# Patient Record
Sex: Female | Born: 1965 | Race: White | Hispanic: No | Marital: Single | Smoking: Current every day smoker
Health system: Southern US, Community
[De-identification: ages and names within clinical notes are randomized; demographics above are authoritative.]

## PROBLEM LIST (undated history)

## (undated) DIAGNOSIS — J449 Chronic obstructive pulmonary disease, unspecified: Secondary | ICD-10-CM

## (undated) DIAGNOSIS — F32A Depression, unspecified: Secondary | ICD-10-CM

## (undated) DIAGNOSIS — F419 Anxiety disorder, unspecified: Secondary | ICD-10-CM

## (undated) DIAGNOSIS — F329 Major depressive disorder, single episode, unspecified: Secondary | ICD-10-CM

## (undated) DIAGNOSIS — E785 Hyperlipidemia, unspecified: Secondary | ICD-10-CM

## (undated) DIAGNOSIS — R569 Unspecified convulsions: Secondary | ICD-10-CM

## (undated) DIAGNOSIS — J439 Emphysema, unspecified: Secondary | ICD-10-CM

## (undated) HISTORY — DX: Depression, unspecified: F32.A

## (undated) HISTORY — DX: Anxiety disorder, unspecified: F41.9

## (undated) HISTORY — PX: KNEE SURGERY: SHX244

## (undated) HISTORY — PX: CERVICAL SPINE SURGERY: SHX589

## (undated) HISTORY — DX: Hyperlipidemia, unspecified: E78.5

## (undated) HISTORY — DX: Emphysema, unspecified: J43.9

## (undated) HISTORY — PX: CHOLECYSTECTOMY: SHX55

## (undated) HISTORY — PX: TONSILLECTOMY: SUR1361

## (undated) HISTORY — PX: BACK SURGERY: SHX140

## (undated) HISTORY — PX: APPENDECTOMY: SHX54

## (undated) HISTORY — DX: Chronic obstructive pulmonary disease, unspecified: J44.9

---
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
## 2005-01-08 HISTORY — PX: ABDOMINAL HYSTERECTOMY: SHX81

## 2015-04-29 ENCOUNTER — Emergency Department (HOSPITAL_COMMUNITY): Payer: Medicaid Other

## 2015-04-29 ENCOUNTER — Emergency Department (HOSPITAL_COMMUNITY)
Admission: EM | Admit: 2015-04-29 | Discharge: 2015-04-30 | Disposition: A | Discharge status: Home / Self Care | Payer: Medicaid Other | Attending: Emergency Medicine | Admitting: Emergency Medicine

## 2015-04-29 ENCOUNTER — Encounter (HOSPITAL_COMMUNITY): Payer: Self-pay | Admitting: *Deleted

## 2015-04-29 DIAGNOSIS — R112 Nausea with vomiting, unspecified: Secondary | ICD-10-CM | POA: Diagnosis not present

## 2015-04-29 DIAGNOSIS — J441 Chronic obstructive pulmonary disease with (acute) exacerbation: Secondary | ICD-10-CM | POA: Insufficient documentation

## 2015-04-29 DIAGNOSIS — F17210 Nicotine dependence, cigarettes, uncomplicated: Secondary | ICD-10-CM | POA: Insufficient documentation

## 2015-04-29 HISTORY — DX: Chronic obstructive pulmonary disease, unspecified: J44.9

## 2015-04-29 HISTORY — DX: Unspecified convulsions: R56.9

## 2015-04-29 LAB — COMPREHENSIVE METABOLIC PANEL
ALBUMIN: 4.3 g/dL (ref 3.5–5.0)
ALT: 19 U/L (ref 14–54)
AST: 18 U/L (ref 15–41)
Alkaline Phosphatase: 84 U/L (ref 38–126)
Anion gap: 10 (ref 5–15)
BUN: 14 mg/dL (ref 6–20)
CO2: 26 mmol/L (ref 22–32)
Calcium: 9.8 mg/dL (ref 8.9–10.3)
Chloride: 108 mmol/L (ref 101–111)
Creatinine, Ser: 0.99 mg/dL (ref 0.44–1.00)
GFR calc Af Amer: >60 mL/min (ref >60)
GLUCOSE: 100 mg/dL — AB (ref 65–99)
Potassium: 4.1 mmol/L (ref 3.5–5.1)
Sodium: 144 mmol/L (ref 135–145)
Total Bilirubin: 0.6 mg/dL (ref 0.3–1.2)
Total Protein: 7.7 g/dL (ref 6.5–8.1)

## 2015-04-29 LAB — CBC
HEMATOCRIT: 46.4 % — AB (ref 36.0–46.0)
Hemoglobin: 16.3 g/dL — ABNORMAL HIGH (ref 12.0–15.0)
MCH: 28.6 pg (ref 26.0–34.0)
MCHC: 35.1 g/dL (ref 30.0–36.0)
MCV: 81.5 fL (ref 78.0–100.0)
PLATELETS: 287 10*3/uL (ref 150–400)
RBC: 5.69 MIL/uL — ABNORMAL HIGH (ref 3.87–5.11)
RDW: 12.8 % (ref 11.5–15.5)
WBC: 17.8 10*3/uL — AB (ref 4.0–10.5)

## 2015-04-29 LAB — URINALYSIS, ROUTINE W REFLEX MICROSCOPIC
BILIRUBIN URINE: NEGATIVE
GLUCOSE, UA: NEGATIVE mg/dL
Ketones, ur: 15 mg/dL — AB
Leukocytes, UA: NEGATIVE
Nitrite: NEGATIVE
PH: 6.5 (ref 5.0–8.0)
PROTEIN: NEGATIVE mg/dL
Specific Gravity, Urine: 1.02 (ref 1.005–1.030)

## 2015-04-29 LAB — URINE MICROSCOPIC-ADD ON

## 2015-04-29 LAB — LIPASE, BLOOD: Lipase: 26 U/L (ref 11–51)

## 2015-04-29 LAB — TROPONIN I: Troponin I: <0.03 ng/mL (ref <0.031)

## 2015-04-29 MED ORDER — SODIUM CHLORIDE 0.9 % IV SOLN: INTRAVENOUS | Status: DC

## 2015-04-29 MED ORDER — METOCLOPRAMIDE HCL 10 MG PO TABS: 10.0000 mg | ORAL_TABLET | Freq: Four times a day (QID) | ORAL | Status: DC | PRN

## 2015-04-29 MED ORDER — ALBUTEROL SULFATE HFA 108 (90 BASE) MCG/ACT IN AERS: 2.0000 | INHALATION_SPRAY | RESPIRATORY_TRACT | Status: DC | PRN

## 2015-04-29 MED ORDER — SODIUM CHLORIDE 0.9 % IV BOLUS (SEPSIS)
1000.0000 mL | Freq: Once | INTRAVENOUS | Status: AC
  Administered 2015-04-29: 1000 mL via INTRAVENOUS

## 2015-04-29 MED ORDER — IPRATROPIUM-ALBUTEROL 0.5-2.5 (3) MG/3ML IN SOLN
3.0000 mL | Freq: Once | RESPIRATORY_TRACT | Status: AC
  Administered 2015-04-29: 3 mL via RESPIRATORY_TRACT
  Filled 2015-04-29: qty 3

## 2015-04-29 MED ORDER — IOPAMIDOL (ISOVUE-300) INJECTION 61%
100.0000 mL | Freq: Once | INTRAVENOUS | Status: AC | PRN
  Administered 2015-04-29: 100 mL via INTRAVENOUS

## 2015-04-29 MED ORDER — METOCLOPRAMIDE HCL 5 MG/ML IJ SOLN
10.0000 mg | Freq: Once | INTRAMUSCULAR | Status: AC
  Administered 2015-04-29: 10 mg via INTRAVENOUS
  Filled 2015-04-29: qty 2

## 2015-04-29 MED ORDER — ALBUTEROL SULFATE (2.5 MG/3ML) 0.083% IN NEBU
2.5000 mg | INHALATION_SOLUTION | Freq: Once | RESPIRATORY_TRACT | Status: AC
  Administered 2015-04-29: 2.5 mg via RESPIRATORY_TRACT
  Filled 2015-04-29: qty 3

## 2015-04-29 MED ORDER — METHYLPREDNISOLONE SODIUM SUCC 125 MG IJ SOLR
125.0000 mg | Freq: Once | INTRAMUSCULAR | Status: AC
  Administered 2015-04-30: 125 mg via INTRAVENOUS
  Filled 2015-04-29: qty 2

## 2015-04-29 MED ORDER — ONDANSETRON HCL 4 MG/2ML IJ SOLN
4.0000 mg | INTRAMUSCULAR | Status: DC | PRN
  Administered 2015-04-29: 4 mg via INTRAVENOUS
  Filled 2015-04-29: qty 2

## 2015-04-29 MED ORDER — ALBUTEROL SULFATE (2.5 MG/3ML) 0.083% IN NEBU
2.5000 mg | INHALATION_SOLUTION | Freq: Once | RESPIRATORY_TRACT | Status: DC
  Filled 2015-04-29: qty 3

## 2015-04-29 MED ORDER — ONDANSETRON HCL 4 MG/2ML IJ SOLN
4.0000 mg | Freq: Once | INTRAMUSCULAR | Status: AC
  Administered 2015-04-29: 4 mg via INTRAVENOUS
  Filled 2015-04-29: qty 2

## 2015-04-29 MED ORDER — SODIUM CHLORIDE 0.9 % IV BOLUS (SEPSIS)
1000.0000 mL | Freq: Once | INTRAVENOUS | Status: AC
Start: 2015-04-29 — End: 2015-04-29
  Administered 2015-04-29: 1000 mL via INTRAVENOUS

## 2015-04-29 MED ORDER — IPRATROPIUM-ALBUTEROL 0.5-2.5 (3) MG/3ML IN SOLN
3.0000 mL | Freq: Once | RESPIRATORY_TRACT | Status: DC
Start: 2015-04-29 — End: 2015-04-30
  Filled 2015-04-29: qty 3

## 2015-04-29 MED ORDER — FENTANYL CITRATE (PF) 100 MCG/2ML IJ SOLN
50.0000 ug | Freq: Once | INTRAMUSCULAR | Status: AC
  Administered 2015-04-29: 50 ug via INTRAVENOUS
  Filled 2015-04-29: qty 2

## 2015-04-29 NOTE — ED Notes (Signed)
Pt reports she was [facility] today at 11 am. Has vomited x 7 today. No diarrhea. Complaining of headache and abdominal pain

## 2015-04-29 NOTE — ED Provider Notes (Signed)
CSN: [id number]     Arrival date & time 04/29/15  1914 History   First MD Initiated Contact with Patient 04/29/15 2103     Chief Complaint  Patient presents with  . Emesis      HPI Pt was seen at 2110. Per pt, c/o gradual onset and persistence of multiple intermittent episodes of N/V that began 2 weeks ago. Pt states she was admitted to [facility] and discharged today. States she was dx COPD exacerbation, flu and seizures. Pt's family states "they didn't do anything," and pt continues to vomit since being discharged this morning. Pt has been unable to tol PO due to her N/V.  Denies diarrhea, abd pain, no CP/SOB, no back pain, no fevers, no black or blood in stools or emesis.    Past Medical History  Diagnosis Date  . COPD (chronic obstructive pulmonary disease) (HCC)   . Seizures [facility])    Past Surgical History  Procedure Laterality Date  . Tonsillectomy    . Appendectomy    . Cholecystectomy    . Abdominal hysterectomy    . Knee surgery    . Back surgery      c5, c6, c7    Social History  Substance Use Topics  . Smoking status: Current Every Day Smoker -- 1.00 packs/day    Types: Cigarettes  . Smokeless tobacco: None  . Alcohol Use: No    Review of Systems ROS: Statement: All systems negative except as marked or noted in the HPI; Constitutional: Negative for fever and chills. ; ; Eyes: Negative for eye pain, redness and discharge. ; ; ENMT: Negative for ear pain, hoarseness, nasal congestion, sinus pressure and sore throat. ; ; Cardiovascular: Negative for chest pain, palpitations, diaphoresis, dyspnea and peripheral edema. ; ; Respiratory: +cough. Negative for wheezing and stridor. ; ; Gastrointestinal: +N/V. Negative for diarrhea, abdominal pain, blood in stool, hematemesis, jaundice and rectal bleeding. . ; ; Genitourinary: Negative for dysuria, flank pain and hematuria. ; ; Musculoskeletal: Negative for back pain and neck pain. Negative for swelling and trauma.; ;  Skin: Negative for pruritus, rash, abrasions, blisters, bruising and skin lesion.; ; Neuro: Negative for headache, lightheadedness and neck stiffness. Negative for weakness, altered level of consciousness , altered mental status, extremity weakness, paresthesias, involuntary movement, seizure and syncope.      Allergies  Aspirin; Erythromycin; and Latex  Home Medications   Prior to Admission medications   Not on File   BP 109/72 mmHg  Pulse 91  Temp(Src) 98.8 F (37.1 C)  Resp 17  Ht  (1.6 m)  Wt 142 lb (64.411 kg)  BMI 25.16 kg/m2  SpO2 92%   21:23 Orthostatic Vital Signs CS  Orthostatic Lying  - BP- Lying: 114/66 mmHg ; Pulse- Lying: 94  Orthostatic Sitting - BP- Sitting: 110/73 mmHg ; Pulse- Sitting: 94  Orthostatic Standing at 0 minutes - BP- Standing at 0 minutes: 109/72 mmHg ; Pulse- Standing at 0 minutes: 92       Physical Exam  2115: Physical examination:  Nursing notes reviewed; Vital signs and O2 SAT reviewed;  Constitutional: Well developed, Well nourished, Well hydrated, In no acute distress; Head:  Normocephalic, atraumatic; Eyes: EOMI, PERRL, No scleral icterus; ENMT: Mouth and pharynx normal, Mucous membranes moist; Neck: Supple, Full range of motion, No lymphadenopathy; Cardiovascular: Regular rate and rhythm, No murmur, rub, or gallop; Respiratory: Breath sounds clear & equal bilaterally, No rales, rhonchi, wheezes.  Speaking full sentences with ease, Normal respiratory effort/excursion. +  coughing and gagging loudly into emesis bag during exam.; Chest: Nontender, Movement normal; Abdomen: Soft, Nontender when distracted. Nondistended, Normal bowel sounds; Genitourinary: No CVA tenderness; Extremities: Pulses normal, No tenderness, No edema, No calf edema or asymmetry.; Neuro: AA&Ox3, Major CN grossly intact.  Speech clear. No gross focal motor or sensory deficits in extremities.; Skin: Color normal, Warm, Dry.   ED Course  Procedures (including critical care  time) Labs Review  Imaging Review  I have personally reviewed and evaluated these images and lab results as part of my medical decision-making.   EKG Interpretation   Date/Time:  Friday April 29 2015 21:30:30 EDT Ventricular Rate:  90 PR Interval:  208 QRS Duration: 98 QT Interval:  350 QTC Calculation: 428 R Axis:   84 Text Interpretation:  Sinus rhythm Borderline prolonged PR interval No old  tracing to compare Confirmed by [facility]  MD, Nicholos Johns [phone]) on 04/29/2015  9:42:28 PM      MDM  MDM Reviewed: nursing note and vitals Interpretation: labs, ECG, x-ray and CT scan     Results for orders placed or performed during the hospital encounter of 04/29/15  Lipase, blood  Result Value Ref Range   Lipase 26 11 - 51 U/L  Comprehensive metabolic panel  Result Value Ref Range   Sodium 144 135 - 145 mmol/L   Potassium 4.1 3.5 - 5.1 mmol/L   Chloride 108 101 - 111 mmol/L   CO2 26 22 - 32 mmol/L   Glucose, Bld 100 (H) 65 - 99 mg/dL   BUN 14 6 - 20 mg/dL   Creatinine, Ser 9.56 0.44 - 1.00 mg/dL   Calcium 9.8 8.9 - 21.3 mg/dL   Total Protein 7.7 6.5 - 8.1 g/dL   Albumin 4.3 3.5 - 5.0 g/dL   AST 18 15 - 41 U/L   ALT 19 14 - 54 U/L   Alkaline Phosphatase 84 38 - 126 U/L   Total Bilirubin 0.6 0.3 - 1.2 mg/dL   GFR calc non Af Amer >60 >60 mL/min   GFR calc Af Amer >60 >60 mL/min   Anion gap 10 5 - 15  CBC  Result Value Ref Range   WBC 17.8 (H) 4.0 - 10.5 K/uL   RBC 5.69 (H) 3.87 - 5.11 MIL/uL   Hemoglobin 16.3 (H) 12.0 - 15.0 g/dL   HCT 08.6 (H) 57.8 - 46.9 %   MCV 81.5 78.0 - 100.0 fL   MCH 28.6 26.0 - 34.0 pg   MCHC 35.1 30.0 - 36.0 g/dL   RDW 62.9 52.8 - 41.3 %   Platelets 287 150 - 400 K/uL  Urinalysis, Routine w reflex microscopic (not at [facility])  Result Value Ref Range   Color, Urine YELLOW YELLOW   APPearance CLEAR CLEAR   Specific Gravity, Urine 1.020 1.005 - 1.030   pH 6.5 5.0 - 8.0   Glucose, UA NEGATIVE NEGATIVE mg/dL   Hgb urine dipstick MODERATE (A)  NEGATIVE   Bilirubin Urine NEGATIVE NEGATIVE   Ketones, ur 15 (A) NEGATIVE mg/dL   Protein, ur NEGATIVE NEGATIVE mg/dL   Nitrite NEGATIVE NEGATIVE   Leukocytes, UA NEGATIVE NEGATIVE  Troponin I  Result Value Ref Range   Troponin I <0.03 <0.031 ng/mL  Urine microscopic-add on  Result Value Ref Range   Squamous Epithelial / LPF 0-5 (A) NONE SEEN   WBC, UA 0-5 0 - 5 WBC/hpf   RBC / HPF 0-5 0 - 5 RBC/hpf   Bacteria, UA MANY (A) NONE SEEN  Dg Chest 2 View 04/29/2015  CLINICAL DATA:  Nonproductive cough for 1 week. Shortness of breath, headache, abdominal pain, and emesis for 2 weeks. Dizziness and body aches. Recent admission [facility] for COPD exacerbation. EXAM: CHEST  2 VIEW COMPARISON:  None. FINDINGS: Hyperinflation suggesting emphysema. Central interstitial pattern to the lungs likely represents chronic bronchitis. Normal heart size and pulmonary vascularity. No focal airspace disease or consolidation in the lungs. No blunting of costophrenic angles. No pneumothorax. Postoperative changes in the cervical spine. IMPRESSION: Emphysematous and chronic bronchitic changes in the lungs. No evidence of active pulmonary disease. Electronically Signed   By: Burman NievesWilliam  Stevens M.D.   On: 04/29/2015 23:29   Ct Abdomen Pelvis W Contrast 04/29/2015  CLINICAL DATA:  Nausea and vomiting. Symptoms for 2 weeks. Patient reports hospitalization at an outside hospital for COPD, discharged today. EXAM: CT ABDOMEN AND PELVIS WITH CONTRAST TECHNIQUE: Multidetector CT imaging of the abdomen and pelvis was performed using the standard protocol following bolus administration of intravenous contrast. CONTRAST:  100mL ISOVUE-300 IOPAMIDOL (ISOVUE-300) INJECTION 61% COMPARISON:  None. FINDINGS: Lower chest: Linear atelectasis in both lower lobes and right middle lobe. No confluent airspace disease. Lower lobe bronchial thickening. No pleural effusion. Liver: No focal lesion. Hepatobiliary: Gallbladder surgically  absent. No biliary dilatation. Pancreas: No ductal dilatation or inflammation. Spleen: Normal. Adrenal glands: No nodule. Kidneys: Symmetric renal enhancement and excretion. No hydronephrosis. Stomach/Bowel: Stomach physiologically distended with ingested contents. There are no dilated or thickened small bowel loops. No bowel obstruction, there is enteric contrast to the transverse colon. Small volume of stool throughout the colon without colonic wall thickening. The appendix is surgically absent. Vascular/Lymphatic: No retroperitoneal adenopathy. Abdominal aorta is normal in caliber. Moderate atherosclerosis without aneurysm. Reproductive: Post hysterectomy.  No adnexal mass. Bladder: Decompressed. Other: No free air, free fluid, or intra-abdominal fluid collection. Musculoskeletal: There are no acute or suspicious osseous abnormalities. Mild degenerative change in the spine. IMPRESSION: No acute abnormality in the abdomen/pelvis. No bowel obstruction, enteric contrast reaches the colon. Electronically Signed   By: Rubye OaksMelanie  Ehinger M.D.   On: 04/29/2015 23:23    2330:  [facility]: pt admitted on 04/28/15 with c/o 4 day hx N/V/D after "sign[facility] out [facility] the [facility] prior" where she had been admitted for the same symptoms. Pt told [place] she had +flu swab while [facility] and was also tx for COPD exacerbation. Pt's tx for COPD exacerbation and N/V (no diarrhea while in [facility] per their records). Pt apparently "was able to get up and walk by herself off the floor and to the outside to smoke" several times, and pt was discharged stating she "feels better."  While there, pt received rx for her seizure meds, as well as levaquin, prednisone. Unclear if pt has MDI.  Pt has been ambulatory in the ED here with steady gait, easy resps, NAD. N/V improved after reglan. IVF bolus given for mild orthostasis on VS. Pt has tol PO well without N/V. No stooling while in the ED. Abd remains  benign. Pt will receive IV solumedrol and neb tx before discharge, and be rx MDI and neb solution. Pt agreeable with this plan. Dx and testing d/w pt and family.  Questions answered.  Verb understanding, agreeable to d/c home with outpt f/u.  0015:  Pt now refuses neb treatment, demanding her IV be removed and stating she just wants to go home now (while eating chips). Will d/c per pt request.   Samuel JesterKathleen Chari Parmenter, DO  05/02/15 0105 

## 2015-04-29 NOTE — ED Notes (Signed)
Pt states she was discharged from [facility] today. Pt states she was admitted for COPD exacerbation, flu, and seizures. Pt reports emesis (unable to keep PO meds down) x 2 weeks, loss of 30lbs in 2 weeks (despite being in the hospital), generalized body aches, and dizziness.

## 2015-04-29 NOTE — ED Notes (Signed)
Respiratory paged

## 2015-04-29 NOTE — ED Notes (Signed)
Pt has been placed for discharge but still has meds and PO challenge due.

## 2015-04-30 MED ORDER — ALBUTEROL SULFATE (2.5 MG/3ML) 0.083% IN NEBU: 2.5000 mg | INHALATION_SOLUTION | RESPIRATORY_TRACT | Status: DC | PRN

## 2015-04-30 NOTE — ED Notes (Signed)
Pt refusing to finish bolus, has removed all leads, and refused her breathing treatment as well. Pt wanting IV out and requesting to go home. EDP [name] made aware. Pt eating sun chips in room.

## 2015-04-30 NOTE — Progress Notes (Signed)
Patient refused neb , just wanted to go home stated she had neb machine at home and could use it.

## 2015-04-30 NOTE — Discharge Instructions (Signed)
Take the prescriptions as directed. Use your albuterol inhaler (2 to 4 puffs) or your albuterol nebulizer (1 unit dose) every 4 hours for the next 7 days, then as needed for cough, wheezing, or shortness of breath. Try to stop smoking. Increase your fluid intake (ie:  Gatoraide) for the next few days.  Eat a bland diet and advance to your regular diet slowly as you can tolerate it. Call your regular medical doctor Monday morning to schedule a follow up appointment within the next 3 days.  Return to the Emergency Department immediately sooner if worsening.

## 2015-05-02 LAB — URINE CULTURE

## 2015-06-21 ENCOUNTER — Emergency Department
Admission: EM | Admit: 2015-06-21 | Discharge: 2015-06-21 | Disposition: A | Discharge status: Home / Self Care | Payer: Medicaid Other | Attending: Emergency Medicine | Admitting: Emergency Medicine

## 2015-06-21 ENCOUNTER — Emergency Department: Payer: Auto Insurance (includes no fault)

## 2015-06-21 ENCOUNTER — Emergency Department: Payer: Medicaid Other

## 2015-06-21 DIAGNOSIS — S2002XA Contusion of left breast, initial encounter: Secondary | ICD-10-CM | POA: Insufficient documentation

## 2015-06-21 DIAGNOSIS — S161XXA Strain of muscle, fascia and tendon at neck level, initial encounter: Secondary | ICD-10-CM

## 2015-06-21 MED ORDER — IBUPROFEN 600 MG PO TABS
600.0000 mg | ORAL_TABLET | Freq: Once | ORAL | Status: AC
Start: 2015-06-21 — End: 2015-06-21
  Administered 2015-06-21: 600 mg via ORAL

## 2015-06-21 MED ORDER — HYDROCODONE-ACETAMINOPHEN 5-325 MG PO TABS
1.0000 | ORAL_TABLET | Freq: Four times a day (QID) | ORAL | Status: DC | PRN
Start: 2015-06-21 — End: 2017-04-25

## 2015-06-21 MED ORDER — IBUPROFEN 600 MG PO TABS
ORAL_TABLET | ORAL | Status: AC
Start: 2015-06-21 — End: ?
  Filled 2015-06-21: qty 1
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
## 2015-06-21 NOTE — ED Notes (Signed)
Bed: N2-A  Expected date:   Expected time:   Means of arrival:   Comments:  EMS
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
## 2015-06-21 NOTE — Discharge Instructions (Signed)
MOTRIN 600MG  EVERY 8 HOURS AS NEEDED.    Understanding Cervical Strain    There are 7 bones (vertebrae) in the neck that are part of the spine. These are called the cervical spine. Cervical strain is a medical term for neck pain. The neck has several layers of muscles. These are connected with tendons to the cervical spine and other bones. Neck pain is often the result of injury to these muscles and tendons.  Causes of cervical strain  Different types of stress on the neck can damage muscles and tendons (soft tissues) and cause cervical strain. Cervical tissues can be damaged by:   The neck being forced past its normal range of motion, such as in a car accident or sports injury   Constant, low-level stress, such as from poor posture or a poorly set-up workspace  Symptoms of cervical strain  These may include:   Neck pain or stiffness   Pain in the shoulders or upper back   Muscle spasms   Headache, often starting at the base of the neck   Irritability, difficulty concentrating, or sleeplessness  Treatment for cervical strain  This problem often gets better on its own. Treatments aim to reduce pain and inflammation and increase the range of motion of the neck. Possible treatments include:   Over-the-counter or prescription pain medicine. These help relieve pain and inflammation.   Stretching exercises to decrease neck stiffness.   Massage to decrease neck stiffness.   Cold or heat pack. These help reduce pain and swelling.  Call 911  Call emergency services right away if you have any of these:   Face drooping or numbness   Numbness or weakness, especially in the arms or on one side   Slurred speech or difficulty speaking   Blurred vision   When to call your healthcare provider  Call your healthcare provider right away if you have any of these:   Fever of 100.71F (38C) or higher, or as directed   Pain or stiffness that gets worse   Symptoms that don't get better, or get worse   Numbness,  tingling, weakness or shooting pains into the arms or legs   New symptoms  Date Last Reviewed: 03/18/2014   2000-2016 [employer] [address] [place], [place] [address]. All rights reserved. This information is not intended as a substitute for professional medical care. Always follow your healthcare professional's instructions.          Bruises (Contusions)    A contusion is a bruise. A bruise happenswhen a blow to your body doesn't break the skin but does break blood vessels beneath the skin. Blood leaking from the broken vessels causes redness and swelling. As it heals, your bruise is likely to turn colors like purple, green, and yellow. This is normal. The bruise should fade in 2 or 3 weeks.  Factors that make you more likely to bruise  Almost everyone bruises now and then. Certain people do bruise more easily than others. You're more prone to bruising as you get older. That's because blood vessels become more fragile with age. You're also more likely to bruise if you have a clotting disorder such as hemophilia or take medications that reduce clotting, including aspirin.  When to go to the emergency room (ER)  Bruises almost always heal on their own without special treatment. But for some people, a bad bruise can be serious. Seek medical care if you:   Have a clotting disorder such as hemophilia.  Have cirrhosis or other serious liver disease.   Takeblood-thinning medications such as warfarin (Coumadin).  What to expect in the ER  A doctor will examine your bruise and ask about any health conditions you have. In some cases, you may have a test to check how well your blood clots. Other treatment will depend on your needs.  Follow-up care  Sometimes a bruise gets worse instead of better. It may become larger and more swollen. This can occur when your body walls off a small pool of blood under the skin (hematoma). In very rare cases, your doctor may need to drain excess blood from the  area.  Tip:  Apply an ice pack or bag of frozen peas to a bruise (keep a thin cloth between the cold source and your skin). This can help reduce redness and swelling.   Date Last Reviewed: 12/07/2012   2000-2016 [employer] [address] [place], [place] [address]. All rights reserved. This information is not intended as a substitute for professional medical care. Always follow your healthcare professional's instructions.

## 2015-06-21 NOTE — ED Notes (Signed)
Pt refused line and labs. Pt states "i just want my neck checked and to see my kids." Dr. Windell Moulding and [facility] RN notified.
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
## 2018-08-09 DIAGNOSIS — R569 Unspecified convulsions: Secondary | ICD-10-CM

## 2018-08-09 HISTORY — DX: Unspecified convulsions: R56.9

## 2018-09-25 ENCOUNTER — Other Ambulatory Visit: Payer: Self-pay | Admitting: Otolaryngology

## 2018-09-25 DIAGNOSIS — K1123 Chronic sialoadenitis: Secondary | ICD-10-CM

## 2018-10-02 ENCOUNTER — Other Ambulatory Visit: Payer: Self-pay

## 2018-10-02 ENCOUNTER — Ambulatory Visit
Admission: RE | Admit: 2018-10-02 | Discharge: 2018-10-02 | Disposition: A | Discharge status: Home / Self Care | Payer: Medicaid Other | Source: Ambulatory Visit | Attending: Otolaryngology | Admitting: Otolaryngology

## 2018-10-02 DIAGNOSIS — K1123 Chronic sialoadenitis: Secondary | ICD-10-CM

## 2018-10-02 MED ORDER — IOPAMIDOL (ISOVUE-300) INJECTION 61%
75.0000 mL | Freq: Once | INTRAVENOUS | Status: AC | PRN
  Administered 2018-10-02: 75 mL via INTRAVENOUS

## 2018-10-06 ENCOUNTER — Encounter: Payer: Self-pay | Admitting: *Deleted

## 2018-10-06 ENCOUNTER — Other Ambulatory Visit: Payer: Self-pay | Admitting: *Deleted

## 2018-10-08 ENCOUNTER — Ambulatory Visit: Payer: Medicaid Other | Admitting: Diagnostic Neuroimaging

## 2018-10-08 ENCOUNTER — Encounter: Payer: Self-pay | Admitting: Diagnostic Neuroimaging

## 2018-10-08 ENCOUNTER — Other Ambulatory Visit: Payer: Self-pay

## 2018-10-08 VITALS — BP 112/82 | HR 96 | Temp 98.2°F | Ht 63.0 in | Wt 135.0 lb

## 2018-10-08 DIAGNOSIS — G40909 Epilepsy, unspecified, not intractable, without status epilepticus: Secondary | ICD-10-CM

## 2018-10-08 MED ORDER — LEVETIRACETAM 500 MG PO TABS: 500.0000 mg | ORAL_TABLET | Freq: Two times a day (BID) | ORAL | 4 refills | Status: DC

## 2018-10-08 NOTE — Patient Instructions (Addendum)
-   start levetiracetam 500mg  twice a day   - may consider video EEG admission to better characterize spells  - According to [place] law, you can not drive unless you are seizure / syncope free for at least 6 months and under physician's care.   - Please maintain precautions. Do not participate in activities where a loss of awareness could harm you or someone else. No swimming alone, no tub bathing, no hot tubs, no driving, no operating motorized vehicles (cars, ATVs, motocycles, etc), lawnmowers, power tools or firearms. No standing at heights, such as rooftops, ladders or stairs. Avoid hot objects such as stoves, heaters, open fires. Wear a helmet when riding a bicycle, scooter, skateboard, etc. and avoid areas of traffic. Set your water heater to 120 degrees or less.
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
## 2019-03-16 ENCOUNTER — Encounter (HOSPITAL_COMMUNITY): Payer: Self-pay | Admitting: Emergency Medicine

## 2019-03-16 ENCOUNTER — Other Ambulatory Visit: Payer: Self-pay

## 2019-03-16 ENCOUNTER — Emergency Department (HOSPITAL_COMMUNITY)
Admission: EM | Admit: 2019-03-16 | Discharge: 2019-03-16 | Disposition: A | Discharge status: Home / Self Care | Payer: Medicaid Other | Attending: Emergency Medicine | Admitting: Emergency Medicine

## 2019-03-16 DIAGNOSIS — J449 Chronic obstructive pulmonary disease, unspecified: Secondary | ICD-10-CM | POA: Insufficient documentation

## 2019-03-16 DIAGNOSIS — Z79899 Other long term (current) drug therapy: Secondary | ICD-10-CM | POA: Insufficient documentation

## 2019-03-16 DIAGNOSIS — Z91040 Latex allergy status: Secondary | ICD-10-CM | POA: Diagnosis not present

## 2019-03-16 DIAGNOSIS — F17210 Nicotine dependence, cigarettes, uncomplicated: Secondary | ICD-10-CM | POA: Insufficient documentation

## 2019-03-16 DIAGNOSIS — R21 Rash and other nonspecific skin eruption: Secondary | ICD-10-CM | POA: Diagnosis present

## 2019-03-16 DIAGNOSIS — Z9049 Acquired absence of other specified parts of digestive tract: Secondary | ICD-10-CM | POA: Insufficient documentation

## 2019-03-16 MED ORDER — PREDNISONE 10 MG (21) PO TBPK: ORAL_TABLET | Freq: Every day | ORAL | 0 refills | Status: DC

## 2019-03-16 MED ORDER — DEXAMETHASONE SODIUM PHOSPHATE 10 MG/ML IJ SOLN
6.0000 mg | Freq: Once | INTRAMUSCULAR | Status: AC
  Administered 2019-03-16: 22:00:00 6 mg via INTRAMUSCULAR
  Filled 2019-03-16: qty 1
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
## 2019-03-16 NOTE — ED Triage Notes (Signed)
Pt c/o rash behind bilateral ears, hairline, and upper back x 2 days.

## 2019-03-16 NOTE — Discharge Instructions (Signed)
Today you were given a shot of Decadron which is a steroid. I have also given you a prescription for continued prednisone.  It is important that you complete the entire course of prednisone.  If you have been taking a steroid to take prednisone for multiple days and then suddenly stop it can cause you to get very ill as your body stops producing its own steroids.   I have given you a prescription for steroids today.  Some common side effects include feelings of extra energy, feeling warm, increased appetite, and stomach upset.  If you are diabetic your sugars may run higher than usual.

## 2019-04-07 ENCOUNTER — Ambulatory Visit: Payer: Medicaid Other | Admitting: Family Medicine

## 2019-08-26 ENCOUNTER — Encounter (HOSPITAL_COMMUNITY): Payer: Self-pay | Admitting: Emergency Medicine

## 2019-08-26 ENCOUNTER — Emergency Department (HOSPITAL_COMMUNITY): Payer: Medicaid Other

## 2019-08-26 ENCOUNTER — Emergency Department (HOSPITAL_COMMUNITY)
Admission: EM | Admit: 2019-08-26 | Discharge: 2019-08-26 | Disposition: A | Discharge status: Home / Self Care | Payer: Medicaid Other | Attending: Emergency Medicine | Admitting: Emergency Medicine

## 2019-08-26 ENCOUNTER — Other Ambulatory Visit: Payer: Self-pay

## 2019-08-26 DIAGNOSIS — R0602 Shortness of breath: Secondary | ICD-10-CM | POA: Diagnosis present

## 2019-08-26 DIAGNOSIS — F17210 Nicotine dependence, cigarettes, uncomplicated: Secondary | ICD-10-CM | POA: Insufficient documentation

## 2019-08-26 DIAGNOSIS — Z91040 Latex allergy status: Secondary | ICD-10-CM | POA: Insufficient documentation

## 2019-08-26 DIAGNOSIS — Z20822 Contact with and (suspected) exposure to covid-19: Secondary | ICD-10-CM | POA: Insufficient documentation

## 2019-08-26 DIAGNOSIS — Z7952 Long term (current) use of systemic steroids: Secondary | ICD-10-CM | POA: Insufficient documentation

## 2019-08-26 DIAGNOSIS — J441 Chronic obstructive pulmonary disease with (acute) exacerbation: Secondary | ICD-10-CM | POA: Insufficient documentation

## 2019-08-26 DIAGNOSIS — Z79899 Other long term (current) drug therapy: Secondary | ICD-10-CM | POA: Insufficient documentation

## 2019-08-26 LAB — CBC WITH DIFFERENTIAL/PLATELET
Abs Immature Granulocytes: 0.03 10*3/uL (ref 0.00–0.07)
Basophils Absolute: 0.1 10*3/uL (ref 0.0–0.1)
Basophils Relative: 1 %
Eosinophils Absolute: 0.3 10*3/uL (ref 0.0–0.5)
Eosinophils Relative: 3 %
HCT: 47.2 % — ABNORMAL HIGH (ref 36.0–46.0)
Hemoglobin: 16.1 g/dL — ABNORMAL HIGH (ref 12.0–15.0)
Immature Granulocytes: 0 %
Lymphocytes Relative: 34 %
Lymphs Abs: 3 10*3/uL (ref 0.7–4.0)
MCH: 28.4 pg (ref 26.0–34.0)
MCHC: 34.1 g/dL (ref 30.0–36.0)
MCV: 83.4 fL (ref 80.0–100.0)
Monocytes Absolute: 0.5 10*3/uL (ref 0.1–1.0)
Monocytes Relative: 6 %
Neutro Abs: 5 10*3/uL (ref 1.7–7.7)
Neutrophils Relative %: 56 %
Platelets: 329 10*3/uL (ref 150–400)
RBC: 5.66 MIL/uL — ABNORMAL HIGH (ref 3.87–5.11)
RDW: 13.2 % (ref 11.5–15.5)
WBC: 8.8 10*3/uL (ref 4.0–10.5)
nRBC: 0 % (ref 0.0–0.2)

## 2019-08-26 LAB — URINALYSIS, ROUTINE W REFLEX MICROSCOPIC
Bilirubin Urine: NEGATIVE
Glucose, UA: NEGATIVE mg/dL
Ketones, ur: NEGATIVE mg/dL
Leukocytes,Ua: NEGATIVE
Nitrite: NEGATIVE
Protein, ur: NEGATIVE mg/dL
Specific Gravity, Urine: 1.001 — ABNORMAL LOW (ref 1.005–1.030)
pH: 6 (ref 5.0–8.0)

## 2019-08-26 LAB — BASIC METABOLIC PANEL
Anion gap: 9 (ref 5–15)
BUN: 6 mg/dL (ref 6–20)
CO2: 23 mmol/L (ref 22–32)
Calcium: 9.1 mg/dL (ref 8.9–10.3)
Chloride: 106 mmol/L (ref 98–111)
Creatinine, Ser: 0.78 mg/dL (ref 0.44–1.00)
GFR calc Af Amer: >60 mL/min (ref >60)
GFR calc non Af Amer: >60 mL/min (ref >60)
Glucose, Bld: 100 mg/dL — ABNORMAL HIGH (ref 70–99)
Potassium: 6.2 mmol/L — ABNORMAL HIGH (ref 3.5–5.1)
Sodium: 138 mmol/L (ref 135–145)

## 2019-08-26 LAB — SARS CORONAVIRUS 2 BY RT PCR (HOSPITAL ORDER, PERFORMED IN ~~LOC~~ HOSPITAL LAB): SARS Coronavirus 2: NEGATIVE

## 2019-08-26 LAB — POTASSIUM: Potassium: 3.8 mmol/L (ref 3.5–5.1)

## 2019-08-26 MED ORDER — DOXYCYCLINE HYCLATE 100 MG PO CAPS: 100.0000 mg | ORAL_CAPSULE | Freq: Two times a day (BID) | ORAL | 0 refills | Status: DC

## 2019-08-26 MED ORDER — PREDNISONE 50 MG PO TABS
60.0000 mg | ORAL_TABLET | Freq: Once | ORAL | Status: AC
  Administered 2019-08-26: 60 mg via ORAL
  Filled 2019-08-26: qty 1

## 2019-08-26 MED ORDER — PREDNISONE 20 MG PO TABS: 60.0000 mg | ORAL_TABLET | Freq: Every day | ORAL | 0 refills | Status: DC

## 2019-08-26 MED ORDER — ALBUTEROL SULFATE HFA 108 (90 BASE) MCG/ACT IN AERS
4.0000 | INHALATION_SPRAY | Freq: Once | RESPIRATORY_TRACT | Status: AC
  Administered 2019-08-26: 4 via RESPIRATORY_TRACT
  Filled 2019-08-26: qty 6.7
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
## 2019-08-26 NOTE — Discharge Instructions (Signed)
You are seen in the emergency department for increased shortness of breath.  Your chest x-ray did not show any obvious pneumonia.  Your blood work and Covid testing were unremarkable.  You had an ultrasound of your right lower leg that did not show any sign of blood clot.  We are treating you with steroids and antibiotics.  Please follow-up with your regular doctor.  Limit tobacco.  Return to the emergency department for any worsening or concerning symptoms.  Consider getting your Covid vaccine.

## 2019-08-26 NOTE — ED Triage Notes (Signed)
Congested, coughing up yellow mucous. Pt also reports 3 raised knots on her legs that are painful, pt concerned it could be blood clots.  Hx of dvt.
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
## 2019-08-26 NOTE — ED Notes (Signed)
Pt reports she has no way home   Will walk to [place] or [employer] to [place], Charity fundraiser, [facility] who reports no cab, or ride available   Out to parking lot , spoke w pt and offered to take her home at 2300 when this nurse got off of work   Pt laughed and said she wanted to walk, it would be good for her

## 2019-10-04 ENCOUNTER — Emergency Department (HOSPITAL_COMMUNITY)
Admission: EM | Admit: 2019-10-04 | Discharge: 2019-10-05 | Disposition: A | Discharge status: Home / Self Care | Payer: Medicaid Other | Attending: Emergency Medicine | Admitting: Emergency Medicine

## 2019-10-04 ENCOUNTER — Encounter (HOSPITAL_COMMUNITY): Payer: Self-pay | Admitting: Emergency Medicine

## 2019-10-04 ENCOUNTER — Other Ambulatory Visit: Payer: Self-pay

## 2019-10-04 DIAGNOSIS — L03113 Cellulitis of right upper limb: Secondary | ICD-10-CM | POA: Diagnosis not present

## 2019-10-04 DIAGNOSIS — F17210 Nicotine dependence, cigarettes, uncomplicated: Secondary | ICD-10-CM | POA: Diagnosis not present

## 2019-10-04 DIAGNOSIS — J449 Chronic obstructive pulmonary disease, unspecified: Secondary | ICD-10-CM | POA: Diagnosis not present

## 2019-10-04 DIAGNOSIS — L02413 Cutaneous abscess of right upper limb: Secondary | ICD-10-CM | POA: Diagnosis present

## 2019-10-04 DIAGNOSIS — Z91040 Latex allergy status: Secondary | ICD-10-CM | POA: Insufficient documentation

## 2019-10-04 MED ORDER — DOXYCYCLINE HYCLATE 100 MG PO TABS
100.0000 mg | ORAL_TABLET | Freq: Once | ORAL | Status: AC
  Administered 2019-10-05: 100 mg via ORAL
  Filled 2019-10-04: qty 1

## 2019-10-04 MED ORDER — DOXYCYCLINE HYCLATE 100 MG PO CAPS: 100.0000 mg | ORAL_CAPSULE | Freq: Two times a day (BID) | ORAL | 0 refills | Status: DC

## 2019-10-04 NOTE — ED Triage Notes (Signed)
Pt here for abscess to right forearm that started today.
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
## 2020-03-19 ENCOUNTER — Observation Stay (HOSPITAL_COMMUNITY)
Admission: EM | Admit: 2020-03-19 | Discharge: 2020-03-20 | Disposition: A | Discharge status: Home / Self Care | Payer: Medicaid Other | Attending: Family Medicine | Admitting: Family Medicine

## 2020-03-19 ENCOUNTER — Encounter (HOSPITAL_COMMUNITY): Payer: Self-pay | Admitting: Emergency Medicine

## 2020-03-19 ENCOUNTER — Emergency Department (HOSPITAL_COMMUNITY): Payer: Medicaid Other

## 2020-03-19 ENCOUNTER — Other Ambulatory Visit: Payer: Self-pay

## 2020-03-19 DIAGNOSIS — K1121 Acute sialoadenitis: Principal | ICD-10-CM | POA: Insufficient documentation

## 2020-03-19 DIAGNOSIS — J449 Chronic obstructive pulmonary disease, unspecified: Secondary | ICD-10-CM | POA: Diagnosis not present

## 2020-03-19 DIAGNOSIS — G40909 Epilepsy, unspecified, not intractable, without status epilepticus: Secondary | ICD-10-CM | POA: Diagnosis not present

## 2020-03-19 DIAGNOSIS — F17210 Nicotine dependence, cigarettes, uncomplicated: Secondary | ICD-10-CM | POA: Diagnosis not present

## 2020-03-19 DIAGNOSIS — Z91040 Latex allergy status: Secondary | ICD-10-CM | POA: Insufficient documentation

## 2020-03-19 DIAGNOSIS — R220 Localized swelling, mass and lump, head: Secondary | ICD-10-CM | POA: Diagnosis present

## 2020-03-19 DIAGNOSIS — Z20822 Contact with and (suspected) exposure to covid-19: Secondary | ICD-10-CM | POA: Diagnosis not present

## 2020-03-19 LAB — COMPREHENSIVE METABOLIC PANEL
ALT: 16 U/L (ref 0–44)
AST: 18 U/L (ref 15–41)
Albumin: 3.6 g/dL (ref 3.5–5.0)
Alkaline Phosphatase: 108 U/L (ref 38–126)
Anion gap: 7 (ref 5–15)
BUN: 6 mg/dL (ref 6–20)
CO2: 25 mmol/L (ref 22–32)
Calcium: 9 mg/dL (ref 8.9–10.3)
Chloride: 106 mmol/L (ref 98–111)
Creatinine, Ser: 0.62 mg/dL (ref 0.44–1.00)
GFR, Estimated: >60 mL/min (ref >60)
Glucose, Bld: 87 mg/dL (ref 70–99)
Potassium: 3.9 mmol/L (ref 3.5–5.1)
Sodium: 138 mmol/L (ref 135–145)
Total Bilirubin: 0.6 mg/dL (ref 0.3–1.2)
Total Protein: 6.5 g/dL (ref 6.5–8.1)

## 2020-03-19 LAB — CBC WITH DIFFERENTIAL/PLATELET
Abs Immature Granulocytes: 0.03 10*3/uL (ref 0.00–0.07)
Basophils Absolute: 0.1 10*3/uL (ref 0.0–0.1)
Basophils Relative: 1 %
Eosinophils Absolute: 0.2 10*3/uL (ref 0.0–0.5)
Eosinophils Relative: 2 %
HCT: 45.5 % (ref 36.0–46.0)
Hemoglobin: 15.8 g/dL — ABNORMAL HIGH (ref 12.0–15.0)
Immature Granulocytes: 0 %
Lymphocytes Relative: 27 %
Lymphs Abs: 2.5 10*3/uL (ref 0.7–4.0)
MCH: 28.6 pg (ref 26.0–34.0)
MCHC: 34.7 g/dL (ref 30.0–36.0)
MCV: 82.3 fL (ref 80.0–100.0)
Monocytes Absolute: 0.5 10*3/uL (ref 0.1–1.0)
Monocytes Relative: 6 %
Neutro Abs: 6 10*3/uL (ref 1.7–7.7)
Neutrophils Relative %: 64 %
Platelets: 263 10*3/uL (ref 150–400)
RBC: 5.53 MIL/uL — ABNORMAL HIGH (ref 3.87–5.11)
RDW: 12.3 % (ref 11.5–15.5)
WBC: 9.4 10*3/uL (ref 4.0–10.5)
nRBC: 0 % (ref 0.0–0.2)

## 2020-03-19 LAB — RESP PANEL BY RT-PCR (FLU A&B, COVID) ARPGX2
Influenza A by PCR: NEGATIVE
Influenza B by PCR: NEGATIVE
SARS Coronavirus 2 by RT PCR: NEGATIVE

## 2020-03-19 LAB — PROTIME-INR
INR: 1 (ref 0.8–1.2)
Prothrombin Time: 12.5 seconds (ref 11.4–15.2)

## 2020-03-19 LAB — APTT: aPTT: 31 seconds (ref 24–36)

## 2020-03-19 LAB — LACTIC ACID, PLASMA
Lactic Acid, Venous: 1 mmol/L (ref 0.5–1.9)
Lactic Acid, Venous: 0.8 mmol/L (ref 0.5–1.9)

## 2020-03-19 MED ORDER — KETOROLAC TROMETHAMINE 30 MG/ML IJ SOLN
15.0000 mg | Freq: Once | INTRAMUSCULAR | Status: AC
  Administered 2020-03-19: 15 mg via INTRAVENOUS
  Filled 2020-03-19: qty 1

## 2020-03-19 MED ORDER — ACETAMINOPHEN 325 MG PO TABS: 650.0000 mg | ORAL_TABLET | Freq: Four times a day (QID) | ORAL | Status: DC | PRN

## 2020-03-19 MED ORDER — DEXAMETHASONE SODIUM PHOSPHATE 10 MG/ML IJ SOLN
10.0000 mg | Freq: Once | INTRAMUSCULAR | Status: AC
  Administered 2020-03-19: 10 mg via INTRAVENOUS
  Filled 2020-03-19: qty 1

## 2020-03-19 MED ORDER — IOHEXOL 350 MG/ML SOLN
75.0000 mL | Freq: Once | INTRAVENOUS | Status: AC | PRN
  Administered 2020-03-19: 75 mL via INTRAVENOUS

## 2020-03-19 MED ORDER — LACTATED RINGERS IV BOLUS (SEPSIS)
1000.0000 mL | Freq: Once | INTRAVENOUS | Status: AC
  Administered 2020-03-20: 1000 mL via INTRAVENOUS

## 2020-03-19 MED ORDER — ACETAMINOPHEN 650 MG RE SUPP: 650.0000 mg | Freq: Four times a day (QID) | RECTAL | Status: DC

## 2020-03-19 MED ORDER — LACTATED RINGERS IV SOLN: INTRAVENOUS | Status: AC

## 2020-03-19 MED ORDER — SODIUM CHLORIDE 0.9 % IV SOLN
3.0000 g | Freq: Four times a day (QID) | INTRAVENOUS | Status: DC
  Administered 2020-03-19 – 2020-03-20 (×4): 3 g via INTRAVENOUS
  Filled 2020-03-19: qty 3
  Filled 2020-03-19: qty 8
  Filled 2020-03-19: qty 3
  Filled 2020-03-19: qty 8
  Filled 2020-03-19: qty 3
  Filled 2020-03-19: qty 8

## 2020-03-19 MED ORDER — OXYCODONE HCL 5 MG PO TABS
5.0000 mg | ORAL_TABLET | Freq: Four times a day (QID) | ORAL | Status: DC | PRN
  Administered 2020-03-19: 5 mg via ORAL
  Filled 2020-03-19 (×2): qty 1

## 2020-03-19 MED ORDER — ENOXAPARIN SODIUM 40 MG/0.4ML ~~LOC~~ SOLN
40.0000 mg | SUBCUTANEOUS | Status: DC
  Administered 2020-03-19: 40 mg via SUBCUTANEOUS
  Filled 2020-03-19 (×2): qty 0.4

## 2020-03-19 MED ORDER — SODIUM CHLORIDE 0.9 % IV SOLN
3.0000 g | Freq: Once | INTRAVENOUS | Status: AC
  Administered 2020-03-19: 3 g via INTRAVENOUS
  Filled 2020-03-19: qty 3

## 2020-03-19 MED ORDER — KETOROLAC TROMETHAMINE 15 MG/ML IJ SOLN
15.0000 mg | Freq: Four times a day (QID) | INTRAMUSCULAR | Status: DC
  Administered 2020-03-19 – 2020-03-20 (×4): 15 mg via INTRAVENOUS
  Filled 2020-03-19 (×4): qty 1

## 2020-03-19 MED ORDER — MOMETASONE FURO-FORMOTEROL FUM 200-5 MCG/ACT IN AERO
2.0000 | INHALATION_SPRAY | Freq: Two times a day (BID) | RESPIRATORY_TRACT | Status: DC
  Administered 2020-03-19 – 2020-03-20 (×2): 2 via RESPIRATORY_TRACT
  Filled 2020-03-19: qty 8.8

## 2020-03-19 MED ORDER — OXYCODONE-ACETAMINOPHEN 5-325 MG PO TABS
1.0000 | ORAL_TABLET | Freq: Once | ORAL | Status: AC
Start: 2020-03-19 — End: 2020-03-19
  Administered 2020-03-19: 1 via ORAL
  Filled 2020-03-19: qty 1

## 2020-03-19 MED ORDER — ACETAMINOPHEN 500 MG PO TABS: 1000.0000 mg | ORAL_TABLET | Freq: Four times a day (QID) | ORAL | Status: DC

## 2020-03-19 MED ORDER — ACETAMINOPHEN 650 MG RE SUPP: 650.0000 mg | Freq: Four times a day (QID) | RECTAL | Status: DC | PRN

## 2020-03-19 NOTE — ED Triage Notes (Signed)
Pt reports R sided facial swelling and pain x 3 days.  Seen at [facility] yesterday and today for same and diagnosed with parotitis.  History of same.  Given Percocet and antibiotics at [facility].

## 2020-03-19 NOTE — ED Notes (Signed)
covid swab sent to lab

## 2020-03-19 NOTE — Progress Notes (Signed)
FPTS Interim Progress Note  S:Patient seen for PM check. Resting comfortably in bed.  Denies complaints.  Says that breathing and pain are doing well.  O: BP 126/84 (BP Location: Left Arm)   Pulse 79   Temp 98.4 F (36.9 C) (Oral)   Resp 18   SpO2 93%    Physical Exam:  General: 55 y.o. female in NAD Lungs: breathing comfortably on RA, speaking in complete sentence  A/P: Continue treatment for acute suppurative parotitis  Protecting airway at present, will continue to monitor closely  Meccariello, Solmon Ice, DO 03/19/2020, 9:16 PM PGY-3, [facility] Service pager [phone]

## 2020-03-19 NOTE — Sepsis Progress Note (Signed)
elink is following this code sepsis 

## 2020-03-19 NOTE — Progress Notes (Signed)
Pharmacy Antibiotic Note  Alexandra Sanchez is a 55 y.o. female admitted on 03/19/2020 with suppurative parotitis. Pharmacy has been consulted for Unasyn dosing. Cr <1, temp and WBC wnl.  Plan: Unasyn 3g IV q6h Follow Cr     Temp (24hrs), Avg:98.3 F (36.8 C), Min:97.9 F (36.6 C), Max:98.8 F (37.1 C)  Recent Labs  Lab 03/19/20 1410 03/19/20 1540  WBC 9.4  --   CREATININE 0.62  --   LATICACIDVEN 1.0 0.8    CrCl cannot be calculated (Unknown ideal weight.).    Allergies  Allergen Reactions  . Aspirin Shortness Of Breath    swelling  . Bee Pollen Other (See Comments)    STOPS BREATHING STOPS BREATHING   . Haloperidol Lactate Shortness Of Breath  . Latex Shortness Of Breath    Swelling   . Erythromycin Hives    Stomach cramps    Fredonia Highland, PharmD, [place], [facility] Clinical Pharmacist [phone] Please check AMION for all [facility] numbers 03/19/2020

## 2020-03-19 NOTE — ED Notes (Signed)
Report to Chris, RN.
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
## 2020-03-19 NOTE — ED Notes (Signed)
Pt to c-t  I offered po med the pt hesitated  And finally decided she would attempt to swallow the pill whenever she returnes from c-t
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
## 2020-03-20 DIAGNOSIS — G40909 Epilepsy, unspecified, not intractable, without status epilepticus: Secondary | ICD-10-CM

## 2020-03-20 DIAGNOSIS — K1121 Acute sialoadenitis: Secondary | ICD-10-CM | POA: Diagnosis not present

## 2020-03-20 LAB — BASIC METABOLIC PANEL
Anion gap: 7 (ref 5–15)
BUN: <5 mg/dL — ABNORMAL LOW (ref 6–20)
CO2: 26 mmol/L (ref 22–32)
Calcium: 9 mg/dL (ref 8.9–10.3)
Chloride: 103 mmol/L (ref 98–111)
Creatinine, Ser: 0.7 mg/dL (ref 0.44–1.00)
GFR, Estimated: >60 mL/min (ref >60)
Glucose, Bld: 124 mg/dL — ABNORMAL HIGH (ref 70–99)
Potassium: 4 mmol/L (ref 3.5–5.1)
Sodium: 136 mmol/L (ref 135–145)

## 2020-03-20 LAB — CBC
HCT: 45.5 % (ref 36.0–46.0)
Hemoglobin: 15.7 g/dL — ABNORMAL HIGH (ref 12.0–15.0)
MCH: 28.2 pg (ref 26.0–34.0)
MCHC: 34.5 g/dL (ref 30.0–36.0)
MCV: 81.8 fL (ref 80.0–100.0)
Platelets: 261 10*3/uL (ref 150–400)
RBC: 5.56 MIL/uL — ABNORMAL HIGH (ref 3.87–5.11)
RDW: 12.1 % (ref 11.5–15.5)
WBC: 8 10*3/uL (ref 4.0–10.5)
nRBC: 0 % (ref 0.0–0.2)

## 2020-03-20 LAB — HIV ANTIBODY (ROUTINE TESTING W REFLEX): HIV Screen 4th Generation wRfx: NONREACTIVE

## 2020-03-20 MED ORDER — LEVETIRACETAM 500 MG PO TABS: 500.0000 mg | ORAL_TABLET | Freq: Two times a day (BID) | ORAL | 1 refills | Status: DC

## 2020-03-20 MED ORDER — PROCHLORPERAZINE EDISYLATE 10 MG/2ML IJ SOLN
10.0000 mg | Freq: Once | INTRAMUSCULAR | Status: AC | PRN
  Administered 2020-03-20: 10 mg via INTRAVENOUS
  Filled 2020-03-20: qty 2

## 2020-03-20 MED ORDER — ACETAMINOPHEN 500 MG PO TABS
1000.0000 mg | ORAL_TABLET | Freq: Four times a day (QID) | ORAL | Status: DC | PRN
  Administered 2020-03-20: 1000 mg via ORAL
  Filled 2020-03-20: qty 2

## 2020-03-20 MED ORDER — LEVETIRACETAM 500 MG PO TABS: 500.0000 mg | ORAL_TABLET | Freq: Two times a day (BID) | ORAL | Status: DC

## 2020-03-20 MED ORDER — DIPHENHYDRAMINE HCL 25 MG PO CAPS
25.0000 mg | ORAL_CAPSULE | Freq: Once | ORAL | Status: AC | PRN
  Administered 2020-03-20: 25 mg via ORAL
  Filled 2020-03-20: qty 1

## 2020-03-20 MED ORDER — LEVETIRACETAM IN NACL 500 MG/100ML IV SOLN
500.0000 mg | Freq: Two times a day (BID) | INTRAVENOUS | Status: DC
  Administered 2020-03-20: 500 mg via INTRAVENOUS
  Filled 2020-03-20 (×2): qty 100

## 2020-03-20 NOTE — Hospital Course (Addendum)
Alexandra Sanchez is a 55 y.o. female who presented with right-sided facial swelling consistent with suppurative parotitis. PMH is significant for COPD, depression, epilepsy. Below is her brief hospital course, please refer to the H&P for additional information.   Acute right suppurative parotitis  Patient had been seen at outside hospital 2 days prior to admission where she was diagnosed with suppurative parotitis. Patient left AMA at that time. In the ED, patient with worsened dyspnea, dysphagia and pain with chewing. CT soft tissue neck shows: right parotid gland asymmetrically enlarged and hyperenhancing with surrounding inflammatory stranding, progressed from yesterday's CT. Edema extends into the right submandibular and right parapharyngeal regions. No salivary stone, mass, or fluid collection is identified. Normal left parotid gland and submandibular glands.  Labs: Hb 15, CMP within normal limits.  Lactic acid 1.Patient received dexamethasone, Toradol and oxycodone in the ER. S/p 1L LR. Started on Unasyn on admission and pain relief with Tylenol, toradol and oxy PRN. Area with improvement by following day. Patient was discharged with instructions to finish Augmentin (previously prescribed at outside hospital). Referral to ENT placed at time of discharge.   COPD Patient was started on [facility] per hospital formulary. Briefly supplemented with 2L O2 (due to headache, no real desaturation). Able to be weaned to room air with stable saturations.   Epilepsy with tonic-clinic seizures Home medication notable for Keppra 500 BID. Patient admitted to not having taken this medication. She was started on Keppra while admitted. No seizures during hospitalization. Referral to neurology placed at time of discharge. Patient was instructed to not drive.
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
## 2020-03-20 NOTE — Discharge Summary (Signed)
Family Medicine Teaching [facility] Discharge Summary  Patient name: Alexandra Sanchez Medical record number: [id number] Date of birth: 23-Jan-1965 Age: 55 y.o. Gender: female Date of Admission: 03/19/2020  Date of Discharge: 03/20/20 Admitting Physician: Latrelle Dodrill, MD  Primary Care Provider: Jonita Albee, [facility] Practice Of Consultants: None  Indication for Hospitalization: Right suppurative parotitis   Discharge Diagnoses/Problem List:  Suppurative Parotitis  COPD Epilepsy  Disposition: Home  Discharge Condition: Stable  Discharge Exam:  Blood pressure (!) 147/94, pulse 68, temperature 97.7 F (36.5 C), temperature source Oral, resp. rate 17, height 5\' 3"  (1.6 m), weight 63.5 kg, SpO2 92 %. Gen: awake, alert, in no distress HEENT: Right side of face near ear mildly edematous without erythema, no induration, mildly tender to palpation Cardio: RRR, without murmur Resp: Increased expiratory phase, wheezing in b/l lower lungs, no respiratory distress Ext: warm and dry, 2+ radial and DP pulses    Brief Hospital Course:  Alexandra Sanchez is a 55 y.o. female who presented with right-sided facial swelling consistent with suppurative parotitis. PMH is significant for COPD, depression, epilepsy. Below is her brief hospital course, please refer to the H&P for additional information.   Acute right suppurative parotitis  Patient had been seen at outside hospital 2 days prior to admission where she was diagnosed with suppurative parotitis. Patient left AMA at that time. In the ED, patient with worsened dyspnea, dysphagia and pain with chewing. CT soft tissue neck shows: right parotid gland asymmetrically enlarged and hyperenhancing with surrounding inflammatory stranding, progressed from yesterday's CT. Edema extends into the right submandibular and right parapharyngeal regions. No salivary stone, mass, or fluid collection is identified. Normal left parotid gland and submandibular glands.  Labs: Hb  15, CMP within normal limits.  Lactic acid 1.Patient received dexamethasone, Toradol and oxycodone in the ER. S/p 1L LR. Started on Unasyn on admission and pain relief with Tylenol, toradol and oxy PRN. Area with improvement by following day. Patient was discharged with instructions to finish Augmentin (previously prescribed at outside hospital). Referral to ENT placed at time of discharge.   COPD Patient was started on [facility] per hospital formulary. Briefly supplemented with 2L O2 (due to headache, no real desaturation). Able to be weaned to room air with stable saturations.   Epilepsy with tonic-clinic seizures Home medication notable for Keppra 500 BID. Patient admitted to not having taken this medication. She was started on Keppra while admitted. No seizures during hospitalization. Referral to neurology placed at time of discharge. Patient was instructed to not drive.     Issues for Follow Up:  1. Neurology referral placed due to history of Epilepsy 2. 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 Instructed patient not to drive given history of epilepsy, appears she has been driving 4. Patient to finish 10-day course of Augmentin   Significant Procedures: None  Significant Labs and Imaging:  Recent Labs  Lab 03/19/20 1410 03/20/20 0125  WBC 9.4 8.0  HGB 15.8* 15.7*  HCT 45.5 45.5  PLT 263 261   Recent Labs  Lab 03/19/20 1410 03/20/20 0125  NA 138 136  K 3.9 4.0  CL 106 103  CO2 25 26  GLUCOSE 87 124*  BUN 6 <5*  CREATININE 0.62 0.70  CALCIUM 9.0 9.0  ALKPHOS 108  --   AST 18  --   ALT 16  --   ALBUMIN 3.6  --     CT Soft Tissue Neck W Contrast  Result Date: 03/19/2020 CLINICAL DATA:  Right-sided facial swelling and pain for 3 days. Diagnosed with parotitis. EXAM: CT NECK WITH CONTRAST TECHNIQUE: Multidetector CT imaging of the neck was performed using the standard protocol following the bolus administration of intravenous contrast. CONTRAST:  4mL  OMNIPAQUE IOHEXOL 350 MG/ML SOLN COMPARISON:  Maxillofacial CT 03/18/2020.  Neck CT 02/20/2019. FINDINGS: Pharynx and larynx: No evidence of mass or swelling. Widely patent airway. Salivary glands: The right parotid gland is asymmetrically enlarged and hyperenhancing with surrounding inflammatory stranding, progressed from yesterday's CT. Edema extends into the right submandibular and right parapharyngeal regions. No salivary stone, mass, or fluid collection is identified. The left parotid gland and both submandibular glands are unremarkable. Thyroid: Unremarkable. Lymph nodes: No enlarged or suspicious lymph nodes in the neck. Vascular: Major vascular structures of the neck.  Patent. Limited intracranial: Unremarkable. Visualized orbits: Unremarkable. Mastoids and visualized paranasal sinuses: Clear. Skeleton: Solid C6-7 anterior fusion. Moderate multilevel cervical facet arthrosis. Edentulous. Upper chest: Clear lung apices. Other: None. IMPRESSION: Acute right parotitis with progressive inflammation. No salivary stone, mass, or fluid collection. Electronically Signed   By: Sebastian Ache M.D.   On: 03/19/2020 16:17     Results/Tests Pending at Time of Discharge: None  Discharge Medications:  Allergies as of 03/20/2020      Reactions   Aspirin Shortness Of Breath   swelling   Bee Pollen Other (See Comments)   STOPS BREATHING STOPS BREATHING   Haloperidol Lactate Shortness Of Breath   Latex Shortness Of Breath   Swelling    Erythromycin Hives   Stomach cramps      Medication List    TAKE these medications   amoxicillin-clavulanate 875-125 MG tablet Commonly known as: AUGMENTIN Take 1 tablet by mouth 2 (two) times daily.   ibuprofen 200 MG tablet Commonly known as: ADVIL Take 800 mg by mouth every 6 (six) hours as needed for fever, headache or mild pain.   levETIRAcetam 500 MG tablet Commonly known as: Keppra Take 1 tablet (500 mg total) by mouth 2 (two) times daily.    oxyCODONE-acetaminophen 5-325 MG tablet Commonly known as: PERCOCET/ROXICET Take 1 tablet by mouth 4 (four) times daily as needed for pain.   Symbicort 160-4.5 MCG/ACT inhaler Generic drug: budesonide-formoterol Inhale 2 puffs into the lungs 2 (two) times daily as needed (wheezing/SOB).       Discharge Instructions: Please refer to Patient Instructions section of EMR for full details.  Patient was counseled important signs and symptoms that should prompt return to medical care, changes in medications, dietary instructions, activity restrictions, and follow up appointments.   Follow-Up Appointments:   Sabino Dick, DO 03/20/2020, 3:19 PM PGY-1, [facility] Family [facility]

## 2020-03-20 NOTE — Discharge Instructions (Signed)
You were hospitalized at [facility] due to swelling of your right parotid gland. This improved after treatment with antibiotics. Please continue the course of antibiotics that you were previously prescribed.  We are so glad you are feeling better.  Be sure to follow-up with your regularly scheduled appointments. We have sent in a referral for Neurology for your epilepsy and ENT due to the recurrence of this parotid infections. You should NOT DRIVE given your history of epilepsy that does not seem to be controlled. We have sent a prescription for Keppra 500 mg BID that you can pick up at your pharmacy. I would really encourage you to follow up with Neurology for further management. Thank you for allowing [place] to take care of you.  Take care, [facility] family medicine team

## 2020-03-20 NOTE — Progress Notes (Signed)
Discharge instructions reviewed and given to pt, pt acknowledge understanding.  

## 2020-03-24 LAB — CULTURE, BLOOD (ROUTINE X 2)
Culture: NO GROWTH
Culture: NO GROWTH
Special Requests: ADEQUATE
Special Requests: ADEQUATE

## 2020-06-20 ENCOUNTER — Ambulatory Visit: Payer: Medicaid Other | Admitting: Pulmonary Disease

## 2020-06-20 ENCOUNTER — Other Ambulatory Visit: Payer: Self-pay

## 2020-06-20 ENCOUNTER — Encounter: Payer: Self-pay | Admitting: Pulmonary Disease

## 2020-06-20 VITALS — BP 160/90 | HR 96 | Temp 98.7°F | Ht 63.0 in | Wt 141.2 lb

## 2020-06-20 DIAGNOSIS — J449 Chronic obstructive pulmonary disease, unspecified: Secondary | ICD-10-CM | POA: Diagnosis not present

## 2020-06-20 MED ORDER — PREDNISONE 10 MG PO TABS: ORAL_TABLET | ORAL | 0 refills | Status: AC

## 2020-06-20 MED ORDER — BREZTRI AEROSPHERE 160-9-4.8 MCG/ACT IN AERO: 2.0000 | INHALATION_SPRAY | Freq: Two times a day (BID) | RESPIRATORY_TRACT | 0 refills | Status: DC

## 2020-06-20 MED ORDER — BREZTRI AEROSPHERE 160-9-4.8 MCG/ACT IN AERO: 2.0000 | INHALATION_SPRAY | Freq: Two times a day (BID) | RESPIRATORY_TRACT | 5 refills | Status: DC

## 2020-06-20 NOTE — Progress Notes (Signed)
[name]  Chief Complaint  Patient presents with   Consult    Chest and nasal congestion, chest pressure since April 2022, productive cough with yellow phlegm since April 2022    Constitutional:  BP (!) 160/90 (BP Location: Left Arm, Cuff Size: Normal)   Pulse 96   Temp 98.7 F (37.1 C) (Temporal)   Ht 5\' 3"  (1.6 m)   Wt 141 lb 3.2 oz (64 kg)   SpO2 95% Comment: Room air  BMI 25.01 kg/m   Past Medical History:  Anxiety, Depression, HLD, Seizures, Parotitis April 2022  Past Surgical History:  She  has a past surgical history that includes Tonsillectomy; Appendectomy; Cholecystectomy; Abdominal hysterectomy (2007); Knee surgery (Right); Back surgery; and Cervical spine surgery.  Brief Summary:  Alexandra Sanchez is a 55 y.o. female smoker with chronic cough.      Subjective:   She is originally from [place].  Has lived in [place] and [place] for past several years.  Her brother has asthma.  She has pet dogs.  Had pneumonia years ago.  No history of TB.  Smokes 1 pack per day.  Smokes marijuana several times per day.  She has persistent cough and wheeze.  Bringing up yellow sputum.  Gets winded when walking.  No fever.  No hemoptysis.  Uses symbicort at night - helps a little.  Has to use albuterol in the morning.  CXR from 05/17/20 showed hyperinflation.  Physical Exam:   Appearance - well kempt   ENMT - no sinus tenderness, no oral exudate, no LAN, Mallampati 3 airway, no stridor  Respiratory - equal breath sounds bilaterally, no wheezing or rales  CV - s1s2 regular rate and rhythm, no murmurs  Ext - no clubbing, no edema  Skin - no rashes  Psych - normal mood and affect   Pulmonary testing:    Chest Imaging:  CT angio chest 07/29/19 >> atherosclerosis  Social History:  She  reports that she has been smoking cigarettes. She has been smoking an average of 1.00 packs per day. She has never used  smokeless tobacco. She reports current drug use. Drug: Marijuana. She reports that she does not drink alcohol.  Family History:  Her family history includes Breast cancer in her maternal aunt; Depression in her father and mother; Diabetes in her father and mother; Hypercholesterolemia in her father.     Assessment/Plan:   Chronic cough with acute asthmatic bronchitis. - will give her course of prednisone and zithromax; she says she has script for zithromax at home already - advised her to put prednisone in apple sauce if she has trouble swallowing the pills - will have her try breztri with a spacer device in place of symbicort - prn albuterol - will arrange for PFT to assess for COPD  Tobacco abuse. - discussed options to assist with smoking cessation - she has history of seizures, so this limits options for medications - advised her to try nicotine patch  Marijuana abuse. - she smokes marijuana to help with sleep and pain control - advised her to look into alternative options such as CBD oil, that would provide similar symptom relief without exacerbating her respiratory issues due to smoke inhalation  Time Spent Involved in Patient Care on Day of Examination:  34 minutes  Follow up:   Patient Instructions  Breztri two puffs in the morning and two puffs in the evening, and rinse your mouth after each use  Albuterol two puffs every 6 hours as needed for cough, wheeze, chest congestion, or shortness of breath  Use spacer device when using breztri or albuterol  Stop using symbicort  Will arrange for pulmonary function test  Prednisone 10 mg pill >> 3 pills daily for 2 days, 2 pills daily for 2 days, 1 pill daily for 2 days  Zithromax 250 mg pill >> 2 pills on day 1, then 1 pill daily for the next 4 days; call if you need a prescription for zithromax  Follow up in 8 weeks  Medication List:   Allergies as of 06/20/2020       Reactions   Aspirin Shortness Of Breath    swelling   Bee Pollen Other (See Comments)   STOPS BREATHING STOPS BREATHING   Haloperidol Lactate Shortness Of Breath   Latex Shortness Of Breath   Swelling    Erythromycin Hives   Stomach cramps        Medication List        Accurate as of June 20, 2020 12:26 PM. If you have any questions, ask your nurse or doctor.          STOP taking these medications    amoxicillin-clavulanate 875-125 MG tablet Commonly known as: AUGMENTIN Stopped by: Coralyn Helling, MD   oxyCODONE-acetaminophen 5-325 MG tablet Commonly known as: PERCOCET/ROXICET Stopped by: Coralyn Helling, MD   Symbicort 160-4.5 MCG/ACT inhaler Generic drug: budesonide-formoterol Stopped by: Coralyn Helling, MD       TAKE these medications    Breztri Aerosphere 160-9-4.8 MCG/ACT Aero Generic drug: Budeson-Glycopyrrol-Formoterol Inhale 2 puffs into the lungs in the morning and at bedtime. Started by: Coralyn Helling, MD   ibuprofen 200 MG tablet Commonly known as: ADVIL Take 800 mg by mouth every 6 (six) hours as needed for fever, headache or mild pain.   levETIRAcetam 500 MG tablet Commonly known as: Keppra Take 1 tablet (500 mg total) by mouth 2 (two) times daily.   predniSONE 10 MG tablet Commonly known as: DELTASONE Take 3 tablets (30 mg total) by mouth daily with breakfast for 2 days, THEN 2 tablets (20 mg total) daily with breakfast for 2 days, THEN 1 tablet (10 mg total) daily with breakfast for 2 days. Start taking on: June 20, 2020 Started by: Coralyn Helling, MD   ProAir HFA 108 [id number] Base) MCG/ACT inhaler Generic drug: albuterol 2 puffs every 4 (four) hours as needed.        Signature:  Coralyn Helling, MD [facility] Pulmonary/Critical Care Pager - [phone] 06/20/2020, 12:26 PM

## 2020-06-20 NOTE — Patient Instructions (Signed)
Breztri two puffs in the morning and two puffs in the evening, and rinse your mouth after each use  Albuterol two puffs every 6 hours as needed for cough, wheeze, chest congestion, or shortness of breath  Use spacer device when using breztri or albuterol  Stop using symbicort  Will arrange for pulmonary function test  Prednisone 10 mg pill >> 3 pills daily for 2 days, 2 pills daily for 2 days, 1 pill daily for 2 days  Zithromax 250 mg pill >> 2 pills on day 1, then 1 pill daily for the next 4 days; call if you need a prescription for zithromax  Follow up in 8 weeks

## 2020-06-28 ENCOUNTER — Ambulatory Visit (HOSPITAL_COMMUNITY)
Admission: RE | Admit: 2020-06-28 | Discharge: 2020-06-28 | Disposition: A | Discharge status: Home / Self Care | Payer: Medicaid Other | Source: Ambulatory Visit | Attending: Pulmonary Disease | Admitting: Pulmonary Disease

## 2020-06-28 ENCOUNTER — Other Ambulatory Visit: Payer: Self-pay

## 2020-06-28 DIAGNOSIS — J449 Chronic obstructive pulmonary disease, unspecified: Secondary | ICD-10-CM | POA: Insufficient documentation

## 2020-06-28 LAB — PULMONARY FUNCTION TEST
DL/VA % pred: 92 %
DL/VA: 3.97 ml/min/mmHg/L
DLCO unc % pred: 87 %
DLCO unc: 17.44 ml/min/mmHg
FEF 25-75 Post: 1.27 L/sec
FEF 25-75 Pre: 0.88 L/sec
FEF2575-%Change-Post: 45 %
FEF2575-%Pred-Post: 50 %
FEF2575-%Pred-Pre: 35 %
FEV1-%Change-Post: 15 %
FEV1-%Pred-Post: 69 %
FEV1-%Pred-Pre: 60 %
FEV1-Post: 1.81 L
FEV1-Pre: 1.58 L
FEV1FVC-%Change-Post: 7 %
FEV1FVC-%Pred-Pre: 81 %
FEV6-%Change-Post: 7 %
FEV6-%Pred-Post: 81 %
FEV6-%Pred-Pre: 75 %
FEV6-Post: 2.61 L
FEV6-Pre: 2.43 L
FEV6FVC-%Change-Post: 0 %
FEV6FVC-%Pred-Post: 102 %
FEV6FVC-%Pred-Pre: 102 %
FVC-%Change-Post: 6 %
FVC-%Pred-Post: 79 %
FVC-%Pred-Pre: 74 %
FVC-Post: 2.62 L
FVC-Pre: 2.46 L
Post FEV1/FVC ratio: 69 %
Post FEV6/FVC ratio: 99 %
Pre FEV1/FVC ratio: 64 %
Pre FEV6/FVC Ratio: 99 %
RV % pred: 172 %
RV: 3.17 L
TLC % pred: 114 %
TLC: 5.6 L

## 2020-06-28 MED ORDER — ALBUTEROL SULFATE (2.5 MG/3ML) 0.083% IN NEBU
2.5000 mg | INHALATION_SOLUTION | Freq: Once | RESPIRATORY_TRACT | Status: AC
  Administered 2020-06-28: 2.5 mg via RESPIRATORY_TRACT

## 2020-08-08 ENCOUNTER — Telehealth: Payer: Self-pay | Admitting: Pulmonary Disease

## 2020-08-08 NOTE — Telephone Encounter (Signed)
ATC patient line just rang and rang will try to call again later

## 2020-08-09 ENCOUNTER — Ambulatory Visit: Payer: Medicaid Other | Admitting: Pulmonary Disease

## 2020-08-09 NOTE — Telephone Encounter (Signed)
Appointment changed to Televisit. Nothing further needed at this time.

## 2020-08-09 NOTE — Telephone Encounter (Signed)
Okay to change to phone visit?

## 2020-08-09 NOTE — Telephone Encounter (Signed)
Called and spoke with patient who is requesting to change appt for Wednesday to phone visit due to transportation issues.  Offered the [facility] transportation service that was free but pt stated it takes afew days.  Dr. Craige Cotta please advise if you are ok with this being switch to a phone visit.

## 2020-08-10 ENCOUNTER — Ambulatory Visit (INDEPENDENT_AMBULATORY_CARE_PROVIDER_SITE_OTHER): Payer: Medicaid Other | Admitting: Pulmonary Disease

## 2020-08-10 ENCOUNTER — Other Ambulatory Visit: Payer: Self-pay

## 2020-08-10 ENCOUNTER — Encounter: Payer: Self-pay | Admitting: Pulmonary Disease

## 2020-08-10 DIAGNOSIS — J449 Chronic obstructive pulmonary disease, unspecified: Secondary | ICD-10-CM | POA: Diagnosis not present

## 2020-08-10 DIAGNOSIS — Z720 Tobacco use: Secondary | ICD-10-CM | POA: Diagnosis not present

## 2020-08-10 MED ORDER — VARENICLINE TARTRATE 0.5 MG X 11 & 1 MG X 42 PO MISC: ORAL | 0 refills | Status: DC

## 2020-08-10 NOTE — Progress Notes (Signed)
[facility]  Chief Complaint  Patient presents with   Follow-up    Follow up after PFT to go over results     Constitutional:  There were no vitals taken for this visit.  Past Medical History:  Anxiety, Depression, HLD, Seizures, Parotitis April 2022  Past Surgical History:  She  has a past surgical history that includes Tonsillectomy; Appendectomy; Cholecystectomy; Abdominal hysterectomy (2007); Knee surgery (Right); Back surgery; and Cervical spine surgery.  Brief Summary:  Alexandra Sanchez is a 55 y.o. female smoker with chronic cough.      Subjective:  Virtual Visit via Telephone Note  I connected with Alexandra Sanchez on 08/10/20 at  9:45 AM EDT by telephone and verified that I am speaking with the correct person using two identifiers.  Location: Patient: home Provider: medical office   I discussed the limitations, risks, security and privacy concerns of performing an evaluation and management service by telephone and the availability of in person appointments. I also discussed with the patient that there may be a patient responsible charge related to this service. The patient expressed understanding and agreed to proceed.  PFT showed mild to moderate obstruction, air trapping, and positive bronchodilator response.  She hasn't been able to afford inhalers.  She is on limit income and has to care for her two teen age daughters.  Her rent takes up most of her monthly income.  She only has to pay $3 for her inhalers, but his is still too much.    She continues to smoke cigarettes and marijuana.  She wasn't able to tolerate nicotine patch - causes skin irritation.  She feels she could stop marijuana if she could get script for lunesta to help sleep but was told by her PCP that this wasn't approved by her insurance.  Physical Exam:   Deferred.   Pulmonary testing:  PFT 06/28/20 >> FEV1 1.81 (69%), FEV1% 69, TLC 5.60 (114%), RV  3.17 (172%), DLCO 87%, +BD  Chest Imaging:  CT angio chest 07/29/19 >> atherosclerosis  Social History:  She  reports that she has been smoking cigarettes. She has been smoking an average of 1 pack per day. She has never used smokeless tobacco. She reports current drug use. Drug: Marijuana. She reports that she does not drink alcohol.  Family History:  Her family history includes Breast cancer in her maternal aunt; Depression in her father and mother; Diabetes in her father and mother; Hypercholesterolemia in her father.     Assessment/Plan:   COPD with asthma. - she is not able to afford most inhaler medications - prn albuterol  Tobacco abuse. - she is willing to try chantix - discussed dosing regimen, setting quit date, and side effects to monitor for  Marijuana abuse. - she smokes marijuana to help with sleep and pain control - asked her to d/w her PCP about whether there are alternative options for pain control regimen  Time Spent Involved in Patient Care on Day of Examination:    I discussed the assessment and treatment plan with the patient. The patient was provided an opportunity to ask questions and all were answered. The patient agreed with the plan and demonstrated an understanding of the instructions.   The patient was advised to call back or seek an in-person evaluation if the symptoms worsen or if the condition fails to improve as anticipated.  I provided 22 minutes of non-face-to-face time during this encounter.   Follow up:  Patient Instructions  Chantix 0.5 mg daily for 3 days, then 0.5 mg twice per day for next 4 days.  Stop smoking after you have used chantix for one week, and then change chantix to 1 mg twice per day.  Call for refills of medication.  Follow up in 3 months - okay to do televisit  Medication List:   Allergies as of 08/10/2020       Reactions   Aspirin Shortness Of Breath   swelling   Bee Pollen Other (See Comments)   STOPS  BREATHING STOPS BREATHING   Haloperidol Lactate Shortness Of Breath   Latex Shortness Of Breath   Swelling    Erythromycin Hives   Stomach cramps        Medication List        Accurate as of August 10, 2020  1:11 PM. If you have any questions, ask your nurse or doctor.          STOP taking these medications    Breztri Aerosphere 160-9-4.8 MCG/ACT Aero Generic drug: Budeson-Glycopyrrol-Formoterol Stopped by: Coralyn Helling, MD       TAKE these medications    ibuprofen 200 MG tablet Commonly known as: ADVIL Take 800 mg by mouth every 6 (six) hours as needed for fever, headache or mild pain.   levETIRAcetam 500 MG tablet Commonly known as: Keppra Take 1 tablet (500 mg total) by mouth 2 (two) times daily.   ProAir HFA 108 (90 Base) MCG/ACT inhaler Generic drug: albuterol 2 puffs every 4 (four) hours as needed.   varenicline 0.5 MG X 11 & 1 MG X 42 tablet Commonly known as: CHANTIX PAK Take one 0.5 mg tablet by mouth once daily for 3 days, then increase to one 0.5 mg tablet twice daily for 4 days, then increase to one 1 mg tablet twice daily. Started by: Coralyn Helling, MD        Signature:  Coralyn Helling, MD [facility] Pulmonary/Critical Care Pager - [phone] 08/10/2020, 1:11 PM

## 2020-08-10 NOTE — Patient Instructions (Signed)
Chantix 0.5 mg daily for 3 days, then 0.5 mg twice per day for next 4 days.  Stop smoking after you have used chantix for one week, and then change chantix to 1 mg twice per day.  Call for refills of medication.  Follow up in 3 months - okay to do televisit

## 2020-08-12 ENCOUNTER — Telehealth: Payer: Self-pay | Admitting: Pulmonary Disease

## 2020-08-12 NOTE — Telephone Encounter (Signed)
Called and spoke with pharmacy to see if they received Rx for chantix and spoke with Misty Stanley he pharmacist who states they got it but it requires a PA. Provided her with fax number to resend PA.  Called and spoke with patient to let her know the above information and she expressed understanding. Once we get PA we will start it. Nothing further needed at this time.

## 2020-08-15 MED ORDER — VARENICLINE TARTRATE 1 MG PO TABS: ORAL_TABLET | ORAL | 0 refills | Status: DC

## 2020-08-15 NOTE — Addendum Note (Signed)
Addended by: Katrinka Blazing R on: 08/15/2020 01:53 PM   Modules accepted: Orders

## 2020-08-15 NOTE — Telephone Encounter (Signed)
PA request was received from (pharmacy): [facility] Phone:[phone] Fax: [phone] Medication name and strength: Varenicline (Chantix) Starter Pak Ordering Provider: Dr. Craige Cotta  Was PA started with [facility]?: Yes If yes, please enter KEY: [id number] Medication tried and failed: N/A Covered Alternatives: Didn't mention  PA sent to plan, time frame for approval / denial: 48-72 hours Routing to myself for follow-up

## 2020-08-15 NOTE — Telephone Encounter (Signed)
Received fax from [facility] stating that PA was denied because the patient hasn't tried and failed 2 of the following preferred drugs on covered drug list. This is what is listed:  Chantix Tablet/ Starting Box/ Continuation Month Box (Brand name) Varenicline tablet (generic for Chantix) (0.5 mg, 1 mg tablets only) Bupropion SR tablet (generic for Zyban tablet) Nicotine gum/lozenge (buccal)/patch   Called pharmacy to see how I should send in new RX because it is saying what we sent in is covered. New RX has been to sent to see if it goes through.

## 2021-10-06 ENCOUNTER — Encounter: Payer: Self-pay | Admitting: *Deleted

## 2021-10-15 ENCOUNTER — Encounter: Payer: Self-pay | Admitting: Cardiology
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
## 2021-10-16 ENCOUNTER — Ambulatory Visit: Payer: Medicaid Other | Attending: Cardiology | Admitting: Cardiology

## 2021-10-16 ENCOUNTER — Other Ambulatory Visit: Payer: Self-pay | Admitting: Cardiology

## 2021-10-16 ENCOUNTER — Encounter: Payer: Self-pay | Admitting: Cardiology

## 2021-10-16 VITALS — BP 138/78 | HR 80 | Ht 63.0 in | Wt 113.6 lb

## 2021-10-16 DIAGNOSIS — R0602 Shortness of breath: Secondary | ICD-10-CM

## 2021-10-16 DIAGNOSIS — Z87898 Personal history of other specified conditions: Secondary | ICD-10-CM

## 2021-10-16 DIAGNOSIS — R011 Cardiac murmur, unspecified: Secondary | ICD-10-CM | POA: Diagnosis not present

## 2021-10-16 DIAGNOSIS — R079 Chest pain, unspecified: Secondary | ICD-10-CM

## 2021-10-16 NOTE — Patient Instructions (Signed)
Medication Instructions:  Your physician recommends that you continue on your current medications as directed. Please refer to the Current Medication list given to you today.   Labwork: none  Testing/Procedures: Your physician has requested that you have a lexiscan myoview. For further information please visit [email or web address]. Please follow instruction sheet, as given.  Your physician has requested that you have an echocardiogram. Echocardiography is a painless test that uses sound waves to create images of your heart. It provides your doctor with information about the size and shape of your heart and how well your heart's chambers and valves are working. This procedure takes approximately one hour. There are no restrictions for this procedure.   Follow-Up:  Your physician recommends that you schedule a follow-up appointment in: Follow Up Pending  Any Other Special Instructions Will Be Listed Below (If Applicable).  If you need a refill on your cardiac medications before your next appointment, please call your pharmacy.

## 2021-10-17 ENCOUNTER — Ambulatory Visit: Payer: Medicaid Other | Attending: Cardiology

## 2021-10-17 DIAGNOSIS — R079 Chest pain, unspecified: Secondary | ICD-10-CM

## 2021-10-17 DIAGNOSIS — R0602 Shortness of breath: Secondary | ICD-10-CM | POA: Diagnosis not present

## 2021-10-17 LAB — ECHOCARDIOGRAM COMPLETE
AR max vel: 1.16 cm2
AV Area VTI: 1.19 cm2
AV Area mean vel: 1.25 cm2
AV Mean grad: 8.5 mmHg
AV Peak grad: 15.6 mmHg
Ao pk vel: 1.97 m/s
Area-P 1/2: 3.08 cm2
Calc EF: 60.4 %
MV M vel: 1.75 m/s
MV Peak grad: 12.3 mmHg
S' Lateral: 3.16 cm
Single Plane A2C EF: 63.6 %
Single Plane A4C EF: 55.7 %

## 2021-10-18 ENCOUNTER — Encounter (HOSPITAL_COMMUNITY): Payer: Medicaid Other

## 2021-10-27 ENCOUNTER — Encounter (HOSPITAL_COMMUNITY): Payer: Medicaid Other

## 2021-11-04 LAB — LAB REPORT - SCANNED: EGFR: 94

## 2022-02-03 ENCOUNTER — Emergency Department (EMERGENCY_DEPARTMENT_HOSPITAL)
Admission: EM | Admit: 2022-02-03 | Discharge: 2022-02-05 | Disposition: A | Discharge status: Home / Self Care | Payer: Medicaid Other | Source: Home / Self Care | Attending: Emergency Medicine | Admitting: Emergency Medicine

## 2022-02-03 ENCOUNTER — Other Ambulatory Visit: Payer: Self-pay

## 2022-02-03 ENCOUNTER — Encounter (HOSPITAL_COMMUNITY): Payer: Self-pay

## 2022-02-03 DIAGNOSIS — Z91040 Latex allergy status: Secondary | ICD-10-CM | POA: Insufficient documentation

## 2022-02-03 DIAGNOSIS — Z1152 Encounter for screening for COVID-19: Secondary | ICD-10-CM | POA: Insufficient documentation

## 2022-02-03 DIAGNOSIS — E876 Hypokalemia: Secondary | ICD-10-CM | POA: Insufficient documentation

## 2022-02-03 DIAGNOSIS — R45851 Suicidal ideations: Secondary | ICD-10-CM

## 2022-02-03 DIAGNOSIS — F32A Depression, unspecified: Secondary | ICD-10-CM | POA: Insufficient documentation

## 2022-02-03 LAB — CBC WITH DIFFERENTIAL/PLATELET
Abs Immature Granulocytes: 0.02 10*3/uL (ref 0.00–0.07)
Basophils Absolute: 0.1 10*3/uL (ref 0.0–0.1)
Basophils Relative: 1 %
Eosinophils Absolute: 0.1 10*3/uL (ref 0.0–0.5)
Eosinophils Relative: 1 %
HCT: 49.4 % — ABNORMAL HIGH (ref 36.0–46.0)
Hemoglobin: 16.8 g/dL — ABNORMAL HIGH (ref 12.0–15.0)
Immature Granulocytes: 0 %
Lymphocytes Relative: 34 %
Lymphs Abs: 3.3 10*3/uL (ref 0.7–4.0)
MCH: 29 pg (ref 26.0–34.0)
MCHC: 34 g/dL (ref 30.0–36.0)
MCV: 85.2 fL (ref 80.0–100.0)
Monocytes Absolute: 0.5 10*3/uL (ref 0.1–1.0)
Monocytes Relative: 6 %
Neutro Abs: 5.5 10*3/uL (ref 1.7–7.7)
Neutrophils Relative %: 58 %
Platelets: 325 10*3/uL (ref 150–400)
RBC: 5.8 MIL/uL — ABNORMAL HIGH (ref 3.87–5.11)
RDW: 12.6 % (ref 11.5–15.5)
WBC: 9.5 10*3/uL (ref 4.0–10.5)
nRBC: 0 % (ref 0.0–0.2)

## 2022-02-03 LAB — SALICYLATE LEVEL: Salicylate Lvl: <7 mg/dL — ABNORMAL LOW (ref 7.0–30.0)

## 2022-02-03 LAB — ETHANOL: Alcohol, Ethyl (B): <10 mg/dL (ref <10)

## 2022-02-03 LAB — RAPID URINE DRUG SCREEN, HOSP PERFORMED
Amphetamines: NOT DETECTED
Barbiturates: NOT DETECTED
Benzodiazepines: NOT DETECTED
Cocaine: NOT DETECTED
Opiates: NOT DETECTED
Tetrahydrocannabinol: POSITIVE — AB

## 2022-02-03 LAB — COMPREHENSIVE METABOLIC PANEL
ALT: 15 U/L (ref 0–44)
AST: 21 U/L (ref 15–41)
Albumin: 4.6 g/dL (ref 3.5–5.0)
Alkaline Phosphatase: 142 U/L — ABNORMAL HIGH (ref 38–126)
Anion gap: 11 (ref 5–15)
BUN: 8 mg/dL (ref 6–20)
CO2: 26 mmol/L (ref 22–32)
Calcium: 9.6 mg/dL (ref 8.9–10.3)
Chloride: 99 mmol/L (ref 98–111)
Creatinine, Ser: 0.82 mg/dL (ref 0.44–1.00)
GFR, Estimated: >60 mL/min (ref >60)
Glucose, Bld: 95 mg/dL (ref 70–99)
Potassium: 3.3 mmol/L — ABNORMAL LOW (ref 3.5–5.1)
Sodium: 136 mmol/L (ref 135–145)
Total Bilirubin: 0.6 mg/dL (ref 0.3–1.2)
Total Protein: 8.2 g/dL — ABNORMAL HIGH (ref 6.5–8.1)

## 2022-02-03 LAB — ACETAMINOPHEN LEVEL: Acetaminophen (Tylenol), Serum: 21 ug/mL (ref 10–30)

## 2022-02-03 LAB — MAGNESIUM: Magnesium: 1.8 mg/dL (ref 1.7–2.4)

## 2022-02-03 MED ORDER — ALUM & MAG HYDROXIDE-SIMETH 200-200-20 MG/5ML PO SUSP
30.0000 mL | Freq: Four times a day (QID) | ORAL | Status: DC | PRN
  Filled 2022-02-03: qty 30

## 2022-02-03 MED ORDER — LACTATED RINGERS IV BOLUS
500.0000 mL | Freq: Once | INTRAVENOUS | Status: AC
  Administered 2022-02-03: 500 mL via INTRAVENOUS

## 2022-02-03 MED ORDER — MOMETASONE FURO-FORMOTEROL FUM 200-5 MCG/ACT IN AERO
2.0000 | INHALATION_SPRAY | Freq: Two times a day (BID) | RESPIRATORY_TRACT | Status: DC
  Administered 2022-02-04: 2 via RESPIRATORY_TRACT
  Filled 2022-02-03 (×2): qty 8.8

## 2022-02-03 MED ORDER — LOPERAMIDE HCL 2 MG PO CAPS
4.0000 mg | ORAL_CAPSULE | Freq: Once | ORAL | Status: AC
  Administered 2022-02-04: 4 mg via ORAL
  Filled 2022-02-03: qty 2

## 2022-02-03 MED ORDER — POTASSIUM CHLORIDE CRYS ER 20 MEQ PO TBCR
40.0000 meq | EXTENDED_RELEASE_TABLET | Freq: Once | ORAL | Status: AC
  Administered 2022-02-03: 40 meq via ORAL
  Filled 2022-02-03: qty 2

## 2022-02-03 MED ORDER — QUETIAPINE FUMARATE 25 MG PO TABS
25.0000 mg | ORAL_TABLET | Freq: Every day | ORAL | Status: DC
  Filled 2022-02-03: qty 1

## 2022-02-03 MED ORDER — ACETAMINOPHEN 325 MG PO TABS: 650.0000 mg | ORAL_TABLET | ORAL | Status: DC | PRN

## 2022-02-03 MED ORDER — NICOTINE 21 MG/24HR TD PT24
21.0000 mg | MEDICATED_PATCH | Freq: Every day | TRANSDERMAL | Status: DC
  Administered 2022-02-03: 21 mg via TRANSDERMAL
  Filled 2022-02-03 (×2): qty 1

## 2022-02-03 MED ORDER — LEVETIRACETAM 500 MG PO TABS
500.0000 mg | ORAL_TABLET | Freq: Two times a day (BID) | ORAL | Status: DC
  Filled 2022-02-03 (×2): qty 1

## 2022-02-03 MED ORDER — ALBUTEROL SULFATE HFA 108 (90 BASE) MCG/ACT IN AERS: 2.0000 | INHALATION_SPRAY | RESPIRATORY_TRACT | Status: DC | PRN

## 2022-02-03 MED ORDER — ATORVASTATIN CALCIUM 40 MG PO TABS
40.0000 mg | ORAL_TABLET | Freq: Every day | ORAL | Status: DC
  Filled 2022-02-03: qty 1

## 2022-02-03 MED ORDER — VENLAFAXINE HCL ER 37.5 MG PO CP24
75.0000 mg | ORAL_CAPSULE | Freq: Every day | ORAL | Status: DC
  Filled 2022-02-03: qty 2

## 2022-02-03 MED ORDER — ONDANSETRON HCL 4 MG PO TABS
4.0000 mg | ORAL_TABLET | Freq: Three times a day (TID) | ORAL | Status: DC | PRN
  Filled 2022-02-03: qty 1

## 2022-02-03 MED ORDER — PROPRANOLOL HCL 10 MG PO TABS
40.0000 mg | ORAL_TABLET | Freq: Two times a day (BID) | ORAL | Status: DC
  Filled 2022-02-03 (×2): qty 4

## 2022-02-03 MED ORDER — ALBUTEROL SULFATE (2.5 MG/3ML) 0.083% IN NEBU: 2.5000 mg | INHALATION_SOLUTION | RESPIRATORY_TRACT | Status: DC | PRN

## 2022-02-03 NOTE — ED Notes (Signed)
Pt alert and oriented upon this RN's assessment. She is withdrawn with flat affect. She denies pain or further needs at this time. [name] Edd Fabian

## 2022-02-03 NOTE — ED Notes (Signed)
This RN updated patient on recommendation from TTS consult. She agrees. She also informs this RN that she does not want any medication while she is here. She is refusing her Keppra , seroquel, inderal and states she hasn't taken those in a year. [name] Edd Fabian
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
## 2022-02-03 NOTE — ED Notes (Signed)
Patient  wanded by security personal items placed in secure locker.

## 2022-02-03 NOTE — ED Notes (Signed)
Pt refusing all meds. She states " I don't want to take anything. I hate my life and I don't want to be here". She reports she hasn't taken any meds x 1 year.  Dr. [name] was made aware that patient is refusing meds. [name] Edd Fabian
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
## 2022-02-03 NOTE — ED Provider Notes (Signed)
[facility] Provider Note   CSN: [id number] Arrival date & time: 02/03/22  1511     History  Chief Complaint  Patient presents with   Suicidal    Alexandra Sanchez is a 57 y.o. female.  HPI 57 year old female presents with depression and suicidal ideation.  She states she has been starving herself for months but just refusing to eat because she wants to die.  She states her family hates her which has caused her to be depressed.  They finally kicked her out of her house today and so she had nowhere to go so she came here.  Over the last week or so she had a couple episodes of passing out.  The last syncope was about a week ago.  She was due to see a cardiologist in a couple days but was not planning on going for these passing out episodes.  No injuries or headaches or chest pain recently.  She states that she is on medicine chronically such as Keppra for seizures but has not been taking it for over a year.  She states she has had issues with depression as has been admitted before.  She does want help.  She endorses using marijuana but denies other drug use, alcohol use.  Home Medications Prior to Admission medications   Medication Sig Start Date End Date Taking? Authorizing Provider  atorvastatin (LIPITOR) 40 MG tablet Take 40 mg by mouth daily.   Yes [provider]  ibuprofen (ADVIL) 200 MG tablet Take 800 mg by mouth every 6 (six) hours as needed for fever, headache or mild pain.   Yes [provider]  PROAIR HFA 108 (90 Base) MCG/ACT inhaler 2 puffs every 4 (four) hours as needed. 06/10/20  Yes [provider]  betamethasone valerate ointment (VALISONE) 0.1 % Apply topically 2 (two) times daily. Patient not taking: Reported on 02/03/2022 01/11/22   [provider]  gabapentin (NEURONTIN) 300 MG capsule Take 300 mg by mouth at bedtime. Patient not taking: Reported on 02/03/2022    [provider]   levETIRAcetam (KEPPRA) 500 MG tablet Take 1 tablet (500 mg total) by mouth 2 (two) times daily. Patient not taking: Reported on 10/16/2021 03/20/20   Sharion Settler, DO  lidocaine (LIDODERM) 5 % APPLY 1 PATCH BY TRANSDERMAL ROUTE ONCE DAILY (MAY WEAR UP TO 12HOURS.) Patient not taking: Reported on 02/03/2022    [provider]  propranolol (INDERAL) 40 MG tablet Take 40 mg by mouth 2 (two) times daily. Patient not taking: Reported on 02/03/2022    [provider]  QUEtiapine (SEROQUEL) 25 MG tablet Take 1 tablet every day by oral route at bedtime for 30 days. Patient not taking: Reported on 02/03/2022 03/29/17   [provider]  SYMBICORT 160-4.5 MCG/ACT inhaler SMARTSIG:2 Puff(s) By Mouth Twice Daily Patient not taking: Reported on 02/03/2022 11/08/21   [provider]  triamcinolone cream (KENALOG) 0.1 % APPLY THIN COAT TO AFFECTED AREA TWICE A DAY Patient not taking: Reported on 02/03/2022    [provider]  venlafaxine XR (EFFEXOR-XR) 75 MG 24 hr capsule TAKE 1 CAPSULE BY MOUTH EVERY DAY IN THE MORNING Patient not taking: Reported on 02/03/2022    [provider]      Allergies    Aspirin, Bee pollen, Haloperidol lactate, Latex, and Erythromycin    Review of Systems   Review of Systems  Constitutional:  Negative for fever.  Cardiovascular:  Negative for chest  pain.  Gastrointestinal:  Positive for diarrhea (about 1 week). Negative for abdominal pain.  Neurological:  Negative for headaches.  Psychiatric/Behavioral:  Positive for suicidal ideas.     Physical Exam Updated Vital Signs BP (!) 152/94 (BP Location: Right Arm)   Pulse 87   Temp 98.3 F (36.8 C)   Resp 17   Ht 5\' 3"  (1.6 m)   Wt 46.3 kg   SpO2 96%   BMI 18.07 kg/m  Physical Exam Vitals and nursing note reviewed.  Constitutional:      Appearance: She is well-developed and underweight. She is not diaphoretic.  HENT:     Head: Normocephalic and atraumatic.   Cardiovascular:     Rate and Rhythm: Normal rate and regular rhythm.     Heart sounds: Normal heart sounds.  Pulmonary:     Effort: Pulmonary effort is normal.     Breath sounds: Normal breath sounds.  Abdominal:     General: There is no distension.  Skin:    General: Skin is warm and dry.  Neurological:     Mental Status: She is alert.  Psychiatric:        Mood and Affect: Mood is depressed.        Thought Content: Thought content includes suicidal ideation. Thought content includes suicidal plan.     ED Results / Procedures / Treatments   Labs (all labs ordered are listed, but only abnormal results are displayed) Labs Reviewed  COMPREHENSIVE METABOLIC PANEL - Abnormal; Notable for the following components:      Result Value   Potassium 3.3 (*)    Total Protein 8.2 (*)    Alkaline Phosphatase 142 (*)    All other components within normal limits  SALICYLATE LEVEL - Abnormal; Notable for the following components:   Salicylate Lvl <7.0 (*)    All other components within normal limits  CBC WITH DIFFERENTIAL/PLATELET - Abnormal; Notable for the following components:   RBC 5.80 (*)    Hemoglobin 16.8 (*)    HCT 49.4 (*)    All other components within normal limits  RAPID URINE DRUG SCREEN, HOSP PERFORMED - Abnormal; Notable for the following components:   Tetrahydrocannabinol POSITIVE (*)    All other components within normal limits  ETHANOL  ACETAMINOPHEN LEVEL  MAGNESIUM    EKG EKG Interpretation  Date/Time:  Saturday February 03 2022 16:05:14 EST Ventricular Rate:  77 PR Interval:  190 QRS Duration: 95 QT Interval:  383 QTC Calculation: 434 R Axis:   88 Text Interpretation: Sinus rhythm no acute ST/T changes no significant change since 2022 Confirmed by 2023 [phone]) on 02/03/2022 4:08:41 PM  Radiology No results found.  Procedures Procedures    Medications Ordered in ED Medications  acetaminophen (TYLENOL) tablet 650 mg (has no administration  in time range)  nicotine (NICODERM CQ - dosed in mg/24 hours) patch 21 mg (21 mg Transdermal Patch Applied 02/03/22 1759)  alum & mag hydroxide-simeth (MAALOX/MYLANTA) 200-200-20 MG/5ML suspension 30 mL (has no administration in time range)  ondansetron (ZOFRAN) tablet 4 mg (has no administration in time range)  atorvastatin (LIPITOR) tablet 40 mg (has no administration in time range)  levETIRAcetam (KEPPRA) tablet 500 mg (has no administration in time range)  mometasone-formoterol (DULERA) 200-5 MCG/ACT inhaler 2 puff (has no administration in time range)  QUEtiapine (SEROQUEL) tablet 25 mg (has no administration in time range)  propranolol (INDERAL) tablet 40 mg (has no administration in time range)  venlafaxine XR (EFFEXOR-XR) 24 hr capsule  75 mg (has no administration in time range)  albuterol (PROVENTIL) (2.5 MG/3ML) 0.083% nebulizer solution 2.5 mg (has no administration in time range)  lactated ringers bolus 500 mL (0 mLs Intravenous Stopped 02/03/22 1734)  potassium chloride SA (KLOR-CON M) CR tablet 40 mEq (40 mEq Oral Given 02/03/22 1745)    ED Course/ Medical Decision Making/ A&P                             Medical Decision Making Amount and/or Complexity of Data Reviewed Labs: ordered.    Details: Mild hypokalemia, otherwise her mildly elevated hemoglobin seems stable compared to baseline. ECG/medicine tests: ordered and independent interpretation performed.    Details: No significant arrhythmia, QTc prolongation or ischemia.  Risk OTC drugs. Prescription drug management.   Patient presents with not eating and drinking very well due to wanting to die/depression.  From a medical perspective I do not see any obvious findings of why she would have had passing out last week though certainly poor p.o. intake could contribute.  There is no obvious heart arrhythmia here on cardiac monitoring besides some sinus arrhythmia noted.  EKG and workup is unremarkable besides mild  hypokalemia.  Thus at this point I think she is medically stable for psychiatric disposition.        Final Clinical Impression(s) / ED Diagnoses Final diagnoses:  Suicidal ideation    Rx / DC Orders ED Discharge Orders     None         Sherwood Gambler, MD 02/03/22 1927

## 2022-02-03 NOTE — ED Notes (Signed)
TTS in process. Ekta Dancer Aaric Dolph,RN 

## 2022-02-03 NOTE — ED Notes (Signed)
Patient placed in appropriate [facility] hospital scrubs. Patient is calm and cooperative at this time. Patients belongings secured in two patient belongings bag as well as her big black personal bag and secured in patient safety lockers. Patient providing urine sample at this time. Patients belongings listed below.  Patients belongings consist of: Jacket, shoes, jeans, bra, shirt, and big black personal belongings bags. This tech secured all belongings with patient labels and placed the three bag into locker # 10 and 11.   Patient is back in room at this time and has just been wanded by security. Primary RN notified.

## 2022-02-04 ENCOUNTER — Emergency Department (HOSPITAL_COMMUNITY): Payer: Medicaid Other

## 2022-02-04 DIAGNOSIS — R45851 Suicidal ideations: Secondary | ICD-10-CM

## 2022-02-04 LAB — RESP PANEL BY RT-PCR (RSV, FLU A&B, COVID)  RVPGX2
Influenza A by PCR: NEGATIVE
Influenza B by PCR: NEGATIVE
Resp Syncytial Virus by PCR: NEGATIVE
SARS Coronavirus 2 by RT PCR: NEGATIVE

## 2022-02-04 LAB — BASIC METABOLIC PANEL
Anion gap: 8 (ref 5–15)
BUN: 10 mg/dL (ref 6–20)
CO2: 20 mmol/L — ABNORMAL LOW (ref 22–32)
Calcium: 8.1 mg/dL — ABNORMAL LOW (ref 8.9–10.3)
Chloride: 109 mmol/L (ref 98–111)
Creatinine, Ser: 0.7 mg/dL (ref 0.44–1.00)
GFR, Estimated: >60 mL/min (ref >60)
Glucose, Bld: 73 mg/dL (ref 70–99)
Potassium: 4.3 mmol/L (ref 3.5–5.1)
Sodium: 137 mmol/L (ref 135–145)

## 2022-02-04 LAB — GASTROINTESTINAL PANEL BY PCR, STOOL (REPLACES STOOL CULTURE)

## 2022-02-04 LAB — CBC
HCT: 43.1 % (ref 36.0–46.0)
Hemoglobin: 14.4 g/dL (ref 12.0–15.0)
MCH: 29.1 pg (ref 26.0–34.0)
MCHC: 33.4 g/dL (ref 30.0–36.0)
MCV: 87.1 fL (ref 80.0–100.0)
Platelets: 240 10*3/uL (ref 150–400)
RBC: 4.95 MIL/uL (ref 3.87–5.11)
RDW: 12.3 % (ref 11.5–15.5)
WBC: 11 10*3/uL — ABNORMAL HIGH (ref 4.0–10.5)
nRBC: 0 % (ref 0.0–0.2)

## 2022-02-04 LAB — C DIFFICILE QUICK SCREEN W PCR REFLEX
C Diff antigen: NEGATIVE
C Diff interpretation: NOT DETECTED
C Diff toxin: NEGATIVE

## 2022-02-04 LAB — URINALYSIS, ROUTINE W REFLEX MICROSCOPIC
Bilirubin Urine: NEGATIVE
Glucose, UA: NEGATIVE mg/dL
Ketones, ur: NEGATIVE mg/dL
Nitrite: NEGATIVE
Protein, ur: 100 mg/dL — AB
Specific Gravity, Urine: 1.035 — ABNORMAL HIGH (ref 1.005–1.030)
pH: 5 (ref 5.0–8.0)

## 2022-02-04 MED ORDER — SODIUM CHLORIDE 0.9 % IV BOLUS
1000.0000 mL | Freq: Once | INTRAVENOUS | Status: AC
  Administered 2022-02-04: 1000 mL via INTRAVENOUS

## 2022-02-04 MED ORDER — ALBUTEROL SULFATE HFA 108 (90 BASE) MCG/ACT IN AERS: 2.0000 | INHALATION_SPRAY | RESPIRATORY_TRACT | Status: DC | PRN

## 2022-02-04 MED ORDER — AEROCHAMBER Z-STAT PLUS/MEDIUM MISC
1.0000 | Freq: Once | Status: AC
  Administered 2022-02-04: 1

## 2022-02-04 MED ORDER — IOHEXOL 300 MG/ML  SOLN
80.0000 mL | Freq: Once | INTRAMUSCULAR | Status: AC | PRN
  Administered 2022-02-04: 80 mL via INTRAVENOUS

## 2022-02-04 MED ORDER — ONDANSETRON HCL 4 MG/2ML IJ SOLN
4.0000 mg | Freq: Once | INTRAMUSCULAR | Status: AC
  Administered 2022-02-04: 4 mg via INTRAVENOUS
  Filled 2022-02-04: qty 2

## 2022-02-04 NOTE — ED Notes (Signed)
Pt refused breakfast tray. This sitter asked pt if I could leave the tray and she could eat it later when she gets hungry and pt stated "no I'm not eating". Nurse notified.

## 2022-02-04 NOTE — ED Notes (Signed)
Pt refused dinner tray. Nurse notified.

## 2022-02-04 NOTE — BH Assessment (Addendum)
Received a call from [facility] asking if patient still needed bed admission. Clinician informed the intake staff that patient  will need bed admission.   The facility also asked to verify if patient had transportation back home and a discharge plan, a resident to return back to. Patient was asked and verified that she had a ride back home and a place to stay upon discharge, if accepted.   Patient remains under review at [facility].

## 2022-02-04 NOTE — ED Notes (Signed)
Pt care taken, is resting, no complaints at this time.

## 2022-02-04 NOTE — ED Notes (Signed)
Pt transported to CT. Cathie Bonnell Larsen Dungan,RN 
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
## 2022-02-04 NOTE — ED Provider Notes (Signed)
I was called to the room as patient was having vomiting, diarrhea and abdominal pain. Patient appears uncomfortable, with diffuse abdominal tenderness. CT imaging was ordered that does not show any acute abdominal emergency.  She likely has enteritis.  C. difficile screen was negative. She is awaiting psychiatric admission   Ripley Fraise, MD 02/04/22 [phone]

## 2022-02-04 NOTE — ED Notes (Signed)
Pt refused vitals. Nurse notified.

## 2022-02-04 NOTE — ED Notes (Signed)
Pt refused all meds. Pt stated " I do not want medications to make me better if I want to die".

## 2022-02-04 NOTE — BH Assessment (Addendum)
Per Ricky Ala, NP, patient recommended for inpatient psychiatric treatment. Referred to [facility] for inpatient treatment. However, the [facility] (Antoinette Cillo, RN), confirmed no beds. Clinician faxed patient to alternative facilities for consideration of bed placement. See hospitals listed below:      Destination  Service Provider Request Status Selected Services Address Phone Fax Patient Preferred  [place] [address], [place] [address] [phone] --  [place]  Pending - Request Sent N/A [address] [place] [address] [phone] --  [place] [address], [place] [address] [phone] --  [place] N/A [place], [place] [address] [phone] --  [place] N/A [address], [place] [address] [phone] --  [facility]  Pending - Request Sent N/A [address] [place] [address] [phone] --  [place] N/A [place], [place] [address] [phone] [place] [address] [place] [phone] [address] [place] [address] [place] --  [facility]  Pending - Request Sent N/A [address] [place], [place] [address] [phone] --  [facility]  Pending - Request Sent N/A [address], [place] [address] [phone] --  [facility]  Pending - Request Sent N/A [address] [place] [address] [phone] --   [facility]  Pending - Request Sent N/A [address] [place] [address] [phone] --  [facility]  Pending - Request Sent N/A [address] [place] [address] [phone] --  [place] N/A [address], [place] [address] [phone] --  [facility]  Pending - Request Sent N/A [place], [place] [address] [phone] --  [facility]  Pending - Request Sent N/A [place] [place] [address] [phone] --  [facility]  Pending - Request Sent N/A [address] [place] [address] [phone] --  [place] N/A [address] [place] [address] [phone] --  [facility]  Pending - Request Sent N/A [address] [place] [address] [phone] --  [facility]  Pending - Request Sent N/A [address], [place] [address] [phone] --  [facility]  Pending - Request Sent N/A [address] [place] [address] [phone] --  [place] N/A [place], [place] [address] [phone] --  [facility]  Pending - Request Sent N/A [address] [place] [address] [phone] --  [place] N/[address] [place] [address] [phone] --  [place]  Pending - Request Sent N/A [address], [place] [address] [phone]

## 2022-02-04 NOTE — ED Notes (Signed)
Pt ambulated to restroom with steady gait. Alexandra Sanchez

## 2022-02-04 NOTE — ED Notes (Signed)
Pt refused lunch tray. This sitter offered something different and pt stated "I'm trying to starve myself... I don't want that". Nurse notified.

## 2022-02-04 NOTE — ED Notes (Signed)
Pt returned from CT. Kable Haywood Jaziah Kwasnik,RN 

## 2022-02-04 NOTE — ED Notes (Signed)
TTS in progress 

## 2022-02-04 NOTE — ED Notes (Signed)
Pt continues to refuse VS. Denies any needs at this time. NAD.

## 2022-02-04 NOTE — ED Notes (Signed)
Pt vomiting. This RN reached out to [facility] for ODT or IM nausea meds.Alexandra Corona Edd Fabian

## 2022-02-04 NOTE — ED Notes (Signed)
Pt. In room coughing persistently with some clear sputum. Lung sounds: some wheezing on respirations primarily on left side.

## 2022-02-04 NOTE — BH Assessment (Addendum)
Disposition Note:   Patient was declined by [facility] due to her hx of Eating Disorder. The facility suggested that patient is better served at a facility that specializes in Eating Disorders.   The known facility for Eating Disorder specialties would be [facility]. However, the age range for admission is 13-30. Therefore, patient would not be a candidate for hospitalization at that location.   Per [facility] (Antoinette Cillo, RN), no beds. Patient has been faxed out to facilities. Additionally, Clinician inquired with (Antoinette Cillo, RN) about the bed availability at [facility]. Requested patient to be reviewed for admission to that particular hospital.   The [facility] suggested reaching out to [facility] in [place]. Clinician reached out to the Intake Clinician. Per "Natalia", patient would not be appropriate for that facility due to SI. She explained that some mental health disorders are expected. However, the SI, poses a liability, because they are not a locked facility.

## 2022-02-04 NOTE — ED Notes (Signed)
Pt requested graham crackers and the sitter provided.

## 2022-02-05 ENCOUNTER — Encounter (HOSPITAL_COMMUNITY): Payer: Self-pay | Admitting: Psychiatry

## 2022-02-05 ENCOUNTER — Other Ambulatory Visit: Payer: Self-pay

## 2022-02-05 ENCOUNTER — Emergency Department: Payer: Medicaid Other | Admitting: Nurse Practitioner

## 2022-02-05 ENCOUNTER — Inpatient Hospital Stay (HOSPITAL_COMMUNITY)
Admission: AD | Admit: 2022-02-05 | Discharge: 2022-02-07 | DRG: 881 | Disposition: A | Discharge status: Other Acute Inpatient Hospital | Payer: Medicaid Other | Attending: Emergency Medicine | Admitting: Emergency Medicine

## 2022-02-05 DIAGNOSIS — Z91030 Bee allergy status: Secondary | ICD-10-CM

## 2022-02-05 DIAGNOSIS — Z818 Family history of other mental and behavioral disorders: Secondary | ICD-10-CM

## 2022-02-05 DIAGNOSIS — R569 Unspecified convulsions: Principal | ICD-10-CM

## 2022-02-05 DIAGNOSIS — E876 Hypokalemia: Secondary | ICD-10-CM | POA: Diagnosis present

## 2022-02-05 DIAGNOSIS — F329 Major depressive disorder, single episode, unspecified: Principal | ICD-10-CM | POA: Diagnosis present

## 2022-02-05 DIAGNOSIS — F17210 Nicotine dependence, cigarettes, uncomplicated: Secondary | ICD-10-CM | POA: Diagnosis present

## 2022-02-05 DIAGNOSIS — Z91040 Latex allergy status: Secondary | ICD-10-CM | POA: Diagnosis not present

## 2022-02-05 DIAGNOSIS — Z90710 Acquired absence of both cervix and uterus: Secondary | ICD-10-CM | POA: Diagnosis not present

## 2022-02-05 DIAGNOSIS — E785 Hyperlipidemia, unspecified: Secondary | ICD-10-CM | POA: Diagnosis present

## 2022-02-05 DIAGNOSIS — Z888 Allergy status to other drugs, medicaments and biological substances status: Secondary | ICD-10-CM | POA: Diagnosis not present

## 2022-02-05 DIAGNOSIS — F411 Generalized anxiety disorder: Secondary | ICD-10-CM | POA: Insufficient documentation

## 2022-02-05 DIAGNOSIS — Z91410 Personal history of adult physical and sexual abuse: Secondary | ICD-10-CM | POA: Diagnosis not present

## 2022-02-05 DIAGNOSIS — Z886 Allergy status to analgesic agent status: Secondary | ICD-10-CM

## 2022-02-05 DIAGNOSIS — Z1152 Encounter for screening for COVID-19: Secondary | ICD-10-CM

## 2022-02-05 DIAGNOSIS — Z96651 Presence of right artificial knee joint: Secondary | ICD-10-CM | POA: Diagnosis present

## 2022-02-05 DIAGNOSIS — Z79899 Other long term (current) drug therapy: Secondary | ICD-10-CM

## 2022-02-05 DIAGNOSIS — G40909 Epilepsy, unspecified, not intractable, without status epilepticus: Secondary | ICD-10-CM | POA: Diagnosis present

## 2022-02-05 DIAGNOSIS — F5000 Anorexia nervosa, unspecified: Secondary | ICD-10-CM | POA: Diagnosis present

## 2022-02-05 DIAGNOSIS — Z62810 Personal history of physical and sexual abuse in childhood: Secondary | ICD-10-CM

## 2022-02-05 DIAGNOSIS — Z681 Body mass index (BMI) 19 or less, adult: Secondary | ICD-10-CM

## 2022-02-05 DIAGNOSIS — J449 Chronic obstructive pulmonary disease, unspecified: Secondary | ICD-10-CM | POA: Diagnosis present

## 2022-02-05 DIAGNOSIS — R45851 Suicidal ideations: Secondary | ICD-10-CM | POA: Diagnosis present

## 2022-02-05 DIAGNOSIS — Z881 Allergy status to other antibiotic agents status: Secondary | ICD-10-CM | POA: Diagnosis not present

## 2022-02-05 DIAGNOSIS — E860 Dehydration: Secondary | ICD-10-CM | POA: Diagnosis not present

## 2022-02-05 DIAGNOSIS — F17200 Nicotine dependence, unspecified, uncomplicated: Secondary | ICD-10-CM | POA: Insufficient documentation

## 2022-02-05 MED ORDER — ALUM & MAG HYDROXIDE-SIMETH 200-200-20 MG/5ML PO SUSP: 30.0000 mL | ORAL | Status: DC | PRN

## 2022-02-05 MED ORDER — OLANZAPINE 5 MG PO TBDP: 5.0000 mg | ORAL_TABLET | Freq: Four times a day (QID) | ORAL | Status: DC | PRN

## 2022-02-05 MED ORDER — MOMETASONE FURO-FORMOTEROL FUM 200-5 MCG/ACT IN AERO: 2.0000 | INHALATION_SPRAY | Freq: Two times a day (BID) | RESPIRATORY_TRACT | Status: DC | PRN

## 2022-02-05 MED ORDER — MAGNESIUM HYDROXIDE 400 MG/5ML PO SUSP: 30.0000 mL | Freq: Every day | ORAL | Status: DC | PRN

## 2022-02-05 MED ORDER — DIPHENHYDRAMINE HCL 25 MG PO CAPS: 25.0000 mg | ORAL_CAPSULE | Freq: Four times a day (QID) | ORAL | Status: DC | PRN

## 2022-02-05 MED ORDER — OLANZAPINE 10 MG IM SOLR: 5.0000 mg | Freq: Four times a day (QID) | INTRAMUSCULAR | Status: DC | PRN

## 2022-02-05 MED ORDER — DIPHENHYDRAMINE HCL 50 MG/ML IJ SOLN: 25.0000 mg | Freq: Four times a day (QID) | INTRAMUSCULAR | Status: DC | PRN

## 2022-02-05 MED ORDER — HYDROXYZINE HCL 25 MG PO TABS: 25.0000 mg | ORAL_TABLET | Freq: Three times a day (TID) | ORAL | Status: DC | PRN

## 2022-02-05 MED ORDER — LORAZEPAM 1 MG PO TABS: 1.0000 mg | ORAL_TABLET | Freq: Four times a day (QID) | ORAL | Status: DC | PRN

## 2022-02-05 MED ORDER — MIRTAZAPINE 7.5 MG PO TABS
7.5000 mg | ORAL_TABLET | Freq: Every day | ORAL | Status: DC
  Filled 2022-02-05 (×3): qty 1

## 2022-02-05 MED ORDER — ACETAMINOPHEN 325 MG PO TABS: 650.0000 mg | ORAL_TABLET | Freq: Four times a day (QID) | ORAL | Status: DC | PRN

## 2022-02-05 NOTE — ED Notes (Signed)
Attempted to call nursing report to [facility].  

## 2022-02-05 NOTE — ED Notes (Signed)
Secretary to call for transport to [facility]
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
## 2022-02-05 NOTE — ED Notes (Addendum)
Faxed Vol. Paperwork to [phone]. [place]

## 2022-02-05 NOTE — Progress Notes (Signed)
Admission Note: Patient is a 57 year old female admitted to the unit voluntarily from [facility] for suicidal ideation, depression and medication noncompliance.  Patient presents with irritable mood and affect.  Patient became hostile, uncooperative, refused to participate during assessment.  When asked to participate with skin assessment, patient became angry, threw all the paperwork on the floor and verbally threatened to harm staff.  Patient continues to be verbally abusive towards staff, calling staff "fucking bitches and cunt". Patient was redirected to her room.  Safety checks initiated.  Patient instructed about unit rules/protocols.  Patient is safe on the unit at this time.

## 2022-02-05 NOTE — ED Notes (Addendum)
Safe Transport to ED to transport pt to [facility] Adult unit per MD order. Pt ambulatory,  No s/s of acute distress noted. This RN to notify [facility] that pt is in route.

## 2022-02-05 NOTE — ED Notes (Addendum)
Patient refused her vitals, nurse was notified

## 2022-02-05 NOTE — ED Provider Notes (Signed)
  Physical Exam  BP 115/71 (BP Location: Right Arm)   Pulse 95   Temp 99.1 F (37.3 C) (Oral)   Resp 18   Ht 5\' 3"  (1.6 m)   Wt 46.3 kg   SpO2 97%   BMI 18.07 kg/m   Physical Exam  Procedures  Procedures  ED Course / MDM    Medical Decision Making Amount and/or Complexity of Data Reviewed Labs: ordered. Radiology: ordered.  Risk OTC drugs. Prescription drug management.   Patient is excepted in transfer to [facility].  Dr. Caswell Corwin.  Reevaluated prior to transfer       Davonna Belling, MD 02/05/22 1537

## 2022-02-05 NOTE — ED Notes (Signed)
Pt asked to brush her hair, I stated I could give her a comb. Pt begins to get aggravated and punch the garage door. Pt is now laying down in the bed.

## 2022-02-05 NOTE — Progress Notes (Signed)
Nurse allowed patient to use personal hair brush to brush her hair, nurse made it aware that if patient abuses this privilege the nurse will remove the hair brush from the room.

## 2022-02-05 NOTE — ED Notes (Signed)
Patient threw her table

## 2022-02-05 NOTE — ED Notes (Signed)
Pt wants her hair brush and hand lotion after asking what is going on. I explained the delay in her bed assignment and she said that is what they said yesterday.

## 2022-02-05 NOTE — Progress Notes (Signed)
Pt was accepted to [facility] 02/05/2022, pending updated orthostatic VS. Bed assignment: 306-1  Pt meets inpatient criteria per Ricky Ala, NP  Attending Physician will be Janine Limbo, MD  Report can be called to: Adult unit: [phone]  Pt can arrive after pending items are received; [facility] to coordinate arrival time  Care Team Notified: [facility] Lynnda Shields, RN, Ricky Ala, NP, and Festus Aloe, RN  [place], [place]  02/05/2022 1:04 PM

## 2022-02-05 NOTE — ED Notes (Signed)
Secretary to fax signed voluntary admission and consent for treatment to [facility] Adult unit

## 2022-02-05 NOTE — Tx Team (Signed)
Initial Treatment Plan 02/05/2022 6:04 PM Alexandra Sanchez [id number]    PATIENT STRESSORS: Financial difficulties   Health problems   Marital or family conflict   Medication change or noncompliance     PATIENT STRENGTHS: Ability for insight  Supportive family/friends    PATIENT IDENTIFIED PROBLEMS: Medication noncompliance  Suicidal ideation  Poor appetite  Anger  Depression             DISCHARGE CRITERIA:  Adequate post-discharge living arrangements Safe-care adequate arrangements made  PRELIMINARY DISCHARGE PLAN: Outpatient therapy Return to previous living arrangement  PATIENT/FAMILY INVOLVEMENT: This treatment plan has been presented to and reviewed with the patient, Alexandra Sanchez, and/or family member.  The patient and family have been given the opportunity to ask questions and make suggestions.  Vela Prose, RN 02/05/2022, 6:04 PM

## 2022-02-05 NOTE — ED Notes (Signed)
Pt received breakfast tray. Pt refusing to eat stating she does not want the food, this sitter told pt that they will leave the tray in the room in case pt decides to eat later on in the day, pt then telling sitter "I'm not eating the fucking food I haven't eaten in a fucking week I'm not going to start now"

## 2022-02-06 DIAGNOSIS — F17200 Nicotine dependence, unspecified, uncomplicated: Secondary | ICD-10-CM | POA: Insufficient documentation

## 2022-02-06 DIAGNOSIS — R569 Unspecified convulsions: Secondary | ICD-10-CM

## 2022-02-06 DIAGNOSIS — F411 Generalized anxiety disorder: Secondary | ICD-10-CM | POA: Insufficient documentation

## 2022-02-06 DIAGNOSIS — F5000 Anorexia nervosa, unspecified: Principal | ICD-10-CM

## 2022-02-06 MED ORDER — FLUOXETINE HCL 20 MG PO CAPS
20.0000 mg | ORAL_CAPSULE | Freq: Every day | ORAL | Status: DC
  Administered 2022-02-06 – 2022-02-07 (×2): 20 mg via ORAL
  Filled 2022-02-06 (×6): qty 1

## 2022-02-06 MED ORDER — NICOTINE POLACRILEX 2 MG MT GUM: 2.0000 mg | CHEWING_GUM | OROMUCOSAL | Status: DC | PRN

## 2022-02-06 MED ORDER — LEVETIRACETAM 500 MG PO TABS
500.0000 mg | ORAL_TABLET | Freq: Two times a day (BID) | ORAL | Status: DC
  Administered 2022-02-06 – 2022-02-07 (×3): 500 mg via ORAL
  Filled 2022-02-06 (×8): qty 1

## 2022-02-06 MED ORDER — TRAZODONE HCL 100 MG PO TABS: 50.0000 mg | ORAL_TABLET | Freq: Every evening | ORAL | Status: DC | PRN
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
## 2022-02-06 NOTE — Group Note (Signed)
[facility] LCSW Group Therapy Note   Group Date: 02/06/2022 Start Time: 1100 End Time: 1200   Type of Therapy and Topic: Group Therapy: Avoiding Self-Sabotaging and Enabling Behaviors  Participation Level: Did Not Attend  Mood:  Description of Group:  In this group, patients will learn how to identify obstacles, self-sabotaging and enabling behaviors, as well as: what are they, why do we do them and what needs these behaviors meet. Discuss unhealthy relationships and how to have positive healthy boundaries with those that sabotage and enable. Explore aspects of self-sabotage and enabling in yourself and how to limit these self-destructive behaviors in everyday life.   Therapeutic Goals: 1. Patient will identify one obstacle that relates to self-sabotage and enabling behaviors 2. Patient will identify one personal self-sabotaging or enabling behavior they did prior to admission 3. Patient will state a plan to change the above identified behavior 4. Patient will demonstrate ability to communicate their needs through discussion and/or role play.    Summary of Patient Progress:      Therapeutic Modalities:  Cognitive Behavioral Therapy Person-Centered Therapy Motivational Interviewing    Semira Stoltzfus S Katiya Fike, LCSW 
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
## 2022-02-06 NOTE — Plan of Care (Signed)
  Problem: Education: Goal: Emotional status will improve Outcome: Progressing   Problem: Activity: Goal: Interest or engagement in activities will improve Outcome: Progressing   Problem: Safety: Goal: Periods of time without injury will increase Outcome: Progressing

## 2022-02-06 NOTE — Progress Notes (Signed)
Patient sitting in bed in her room during this assessment. RN invited patient to the unit group this evening, patient replied "I am not leaving this 'fucking' room until I get discharged."  Patient stated she just bought a brand new storage, that she will loose everything if they don't "fucking" discharge me. Patient reports poor sleep, rated anxiety and depression at zero, denies SI, HI, and AVH. Patient refused medications when offered by RN.   Emotional support and encouragement provided by staff, Patient contracts for safety, Q 15 minutes safety checks in place, patient remains safe in room.
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
## 2022-02-06 NOTE — Progress Notes (Signed)
Psychoeducational Group Note  Date:  02/06/2022 Time:  2117  Group Topic/Focus:  Wrap-Up Group:   The focus of this group is to help patients review their daily goal of treatment and discuss progress on daily workbooks.  Participation Level: Did Not Attend  Participation Quality:  Not Applicable  Affect:  Not Applicable  Cognitive:  Not Applicable  Insight:  Not Applicable  Engagement in Group: Not Applicable  Additional Comments:  The patient did not attend group this evening.   Archie Balboa S 02/06/2022, 9:17 PM

## 2022-02-06 NOTE — [facility] Group Notes (Signed)
PT did not attend goals group today 

## 2022-02-06 NOTE — Progress Notes (Addendum)
Pt unwilling to interact this morning with RN. Pt remained in bed under her blanket and did not provide responses to nurse's questions. MHT notified RN that pt complained about the opening and closing of her door from safety checks. Pt presents with an irritable, angry affect. Pt refused lunch meal tray or drink fluids despite staff encouragement to eat and drink. Q15 min checks verified for safety. Pt is safe on the unit.   02/06/22 1014  Psych Admission Type (Psych Patients Only)  Admission Status Voluntary  Psychosocial Assessment  Patient Complaints Agitation;Irritability  Eye Contact Avoids;None  Facial Expression Angry  Affect Angry  Speech UTA  Interaction Avoidant  Motor Activity Other (Comment) (Unable to assess)  Appearance/Hygiene UTA  Behavior Characteristics Unwilling to participate;Irritable  Mood Angry  Thought Process  Coherency Unable to assess  Content Blaming others  Delusions UTA  Perception UTA  Hallucination None reported or observed  Judgment Poor  Confusion None  Danger to Self  Current suicidal ideation? Denies  Danger to Others  Danger to Others None reported or observed

## 2022-02-07 ENCOUNTER — Encounter (HOSPITAL_COMMUNITY): Payer: Self-pay | Admitting: Psychiatry

## 2022-02-07 ENCOUNTER — Encounter (HOSPITAL_COMMUNITY): Payer: Self-pay | Admitting: Internal Medicine

## 2022-02-07 ENCOUNTER — Other Ambulatory Visit: Payer: Self-pay

## 2022-02-07 ENCOUNTER — Inpatient Hospital Stay (HOSPITAL_COMMUNITY): Payer: Medicaid Other

## 2022-02-07 ENCOUNTER — Inpatient Hospital Stay (HOSPITAL_COMMUNITY)
Admission: EM | Admit: 2022-02-07 | Discharge: 2022-02-08 | DRG: 101 | Discharge status: Against Medical Advice | Payer: Medicaid Other | Attending: Internal Medicine | Admitting: Internal Medicine

## 2022-02-07 ENCOUNTER — Encounter (HOSPITAL_COMMUNITY): Payer: Self-pay

## 2022-02-07 DIAGNOSIS — E785 Hyperlipidemia, unspecified: Secondary | ICD-10-CM | POA: Diagnosis present

## 2022-02-07 DIAGNOSIS — Z803 Family history of malignant neoplasm of breast: Secondary | ICD-10-CM | POA: Diagnosis not present

## 2022-02-07 DIAGNOSIS — Z7951 Long term (current) use of inhaled steroids: Secondary | ICD-10-CM

## 2022-02-07 DIAGNOSIS — J449 Chronic obstructive pulmonary disease, unspecified: Secondary | ICD-10-CM | POA: Diagnosis present

## 2022-02-07 DIAGNOSIS — Z833 Family history of diabetes mellitus: Secondary | ICD-10-CM | POA: Diagnosis not present

## 2022-02-07 DIAGNOSIS — Z91148 Patient's other noncompliance with medication regimen for other reason: Secondary | ICD-10-CM | POA: Diagnosis not present

## 2022-02-07 DIAGNOSIS — R45851 Suicidal ideations: Secondary | ICD-10-CM | POA: Diagnosis present

## 2022-02-07 DIAGNOSIS — F509 Eating disorder, unspecified: Secondary | ICD-10-CM | POA: Diagnosis present

## 2022-02-07 DIAGNOSIS — Z886 Allergy status to analgesic agent status: Secondary | ICD-10-CM | POA: Diagnosis not present

## 2022-02-07 DIAGNOSIS — Z818 Family history of other mental and behavioral disorders: Secondary | ICD-10-CM | POA: Diagnosis not present

## 2022-02-07 DIAGNOSIS — Z5320 Procedure and treatment not carried out because of patient's decision for unspecified reasons: Secondary | ICD-10-CM | POA: Diagnosis present

## 2022-02-07 DIAGNOSIS — Z91040 Latex allergy status: Secondary | ICD-10-CM

## 2022-02-07 DIAGNOSIS — G40909 Epilepsy, unspecified, not intractable, without status epilepticus: Secondary | ICD-10-CM | POA: Diagnosis present

## 2022-02-07 DIAGNOSIS — Z91030 Bee allergy status: Secondary | ICD-10-CM

## 2022-02-07 DIAGNOSIS — Z881 Allergy status to other antibiotic agents status: Secondary | ICD-10-CM | POA: Diagnosis not present

## 2022-02-07 DIAGNOSIS — F17210 Nicotine dependence, cigarettes, uncomplicated: Secondary | ICD-10-CM | POA: Diagnosis present

## 2022-02-07 DIAGNOSIS — R569 Unspecified convulsions: Secondary | ICD-10-CM | POA: Diagnosis present

## 2022-02-07 DIAGNOSIS — Z781 Physical restraint status: Secondary | ICD-10-CM | POA: Diagnosis not present

## 2022-02-07 DIAGNOSIS — F411 Generalized anxiety disorder: Secondary | ICD-10-CM | POA: Diagnosis present

## 2022-02-07 DIAGNOSIS — E860 Dehydration: Secondary | ICD-10-CM | POA: Diagnosis present

## 2022-02-07 DIAGNOSIS — Z83438 Family history of other disorder of lipoprotein metabolism and other lipidemia: Secondary | ICD-10-CM

## 2022-02-07 DIAGNOSIS — F329 Major depressive disorder, single episode, unspecified: Secondary | ICD-10-CM | POA: Diagnosis present

## 2022-02-07 DIAGNOSIS — Z1152 Encounter for screening for COVID-19: Secondary | ICD-10-CM

## 2022-02-07 DIAGNOSIS — Z79899 Other long term (current) drug therapy: Secondary | ICD-10-CM

## 2022-02-07 LAB — CBC
HCT: 45.3 % (ref 36.0–46.0)
Hemoglobin: 15.5 g/dL — ABNORMAL HIGH (ref 12.0–15.0)
MCH: 28.9 pg (ref 26.0–34.0)
MCHC: 34.2 g/dL (ref 30.0–36.0)
MCV: 84.4 fL (ref 80.0–100.0)
Platelets: 284 10*3/uL (ref 150–400)
RBC: 5.37 MIL/uL — ABNORMAL HIGH (ref 3.87–5.11)
RDW: 12.2 % (ref 11.5–15.5)
WBC: 7.3 10*3/uL (ref 4.0–10.5)
nRBC: 0 % (ref 0.0–0.2)

## 2022-02-07 LAB — LIPID PANEL
Cholesterol: 162 mg/dL (ref 0–200)
HDL: 36 mg/dL — ABNORMAL LOW (ref >40)
LDL Cholesterol: 86 mg/dL (ref 0–99)
Total CHOL/HDL Ratio: 4.5 RATIO
Triglycerides: 198 mg/dL — ABNORMAL HIGH (ref <150)
VLDL: 40 mg/dL (ref 0–40)

## 2022-02-07 LAB — TSH: TSH: 1.003 u[IU]/mL (ref 0.350–4.500)

## 2022-02-07 LAB — BASIC METABOLIC PANEL
Anion gap: 8 (ref 5–15)
BUN: 14 mg/dL (ref 6–20)
CO2: 27 mmol/L (ref 22–32)
Calcium: 9.2 mg/dL (ref 8.9–10.3)
Chloride: 102 mmol/L (ref 98–111)
Creatinine, Ser: 0.93 mg/dL (ref 0.44–1.00)
GFR, Estimated: >60 mL/min (ref >60)
Glucose, Bld: 138 mg/dL — ABNORMAL HIGH (ref 70–99)
Potassium: 3.6 mmol/L (ref 3.5–5.1)
Sodium: 137 mmol/L (ref 135–145)

## 2022-02-07 LAB — GLUCOSE, CAPILLARY: Glucose-Capillary: 175 mg/dL — ABNORMAL HIGH (ref 70–99)

## 2022-02-07 LAB — TROPONIN I (HIGH SENSITIVITY)
Troponin I (High Sensitivity): 5 ng/L (ref <18)
Troponin I (High Sensitivity): 5 ng/L (ref <18)

## 2022-02-07 MED ORDER — CHLORPROMAZINE HCL 25 MG/ML IJ SOLN
50.0000 mg | Freq: Three times a day (TID) | INTRAMUSCULAR | Status: DC | PRN
  Filled 2022-02-07: qty 2

## 2022-02-07 MED ORDER — NICOTINE 14 MG/24HR TD PT24
14.0000 mg | MEDICATED_PATCH | Freq: Every day | TRANSDERMAL | Status: DC
  Administered 2022-02-07: 14 mg via TRANSDERMAL
  Filled 2022-02-07 (×2): qty 1

## 2022-02-07 MED ORDER — OLANZAPINE 10 MG PO TBDP
ORAL_TABLET | ORAL | Status: AC
  Filled 2022-02-07: qty 1

## 2022-02-07 MED ORDER — ONDANSETRON HCL 4 MG PO TABS: 4.0000 mg | ORAL_TABLET | Freq: Four times a day (QID) | ORAL | Status: DC | PRN

## 2022-02-07 MED ORDER — ACETAMINOPHEN 325 MG PO TABS: 650.0000 mg | ORAL_TABLET | Freq: Four times a day (QID) | ORAL | Status: DC | PRN

## 2022-02-07 MED ORDER — ACETAMINOPHEN 650 MG RE SUPP: 650.0000 mg | Freq: Four times a day (QID) | RECTAL | Status: DC | PRN

## 2022-02-07 MED ORDER — OLANZAPINE 10 MG PO TBDP
10.0000 mg | ORAL_TABLET | Freq: Four times a day (QID) | ORAL | Status: DC | PRN
  Administered 2022-02-07: 10 mg via ORAL

## 2022-02-07 MED ORDER — ENOXAPARIN SODIUM 40 MG/0.4ML IJ SOSY: 40.0000 mg | PREFILLED_SYRINGE | INTRAMUSCULAR | Status: DC

## 2022-02-07 MED ORDER — LORAZEPAM 1 MG PO TABS: 2.0000 mg | ORAL_TABLET | Freq: Four times a day (QID) | ORAL | Status: DC | PRN

## 2022-02-07 MED ORDER — DIPHENHYDRAMINE HCL 25 MG PO CAPS: 50.0000 mg | ORAL_CAPSULE | Freq: Four times a day (QID) | ORAL | Status: DC | PRN

## 2022-02-07 MED ORDER — DIPHENHYDRAMINE HCL 50 MG/ML IJ SOLN: 50.0000 mg | Freq: Four times a day (QID) | INTRAMUSCULAR | Status: DC | PRN

## 2022-02-07 MED ORDER — LORAZEPAM 2 MG/ML IJ SOLN
INTRAMUSCULAR | Status: AC
  Administered 2022-02-07: 2 mg via INTRAMUSCULAR
  Filled 2022-02-07: qty 1

## 2022-02-07 MED ORDER — ONDANSETRON HCL 4 MG/2ML IJ SOLN: 4.0000 mg | Freq: Four times a day (QID) | INTRAMUSCULAR | Status: DC | PRN

## 2022-02-07 MED ORDER — LORAZEPAM 2 MG/ML IJ SOLN: 2.0000 mg | INTRAMUSCULAR | Status: DC | PRN

## 2022-02-07 MED ORDER — LORAZEPAM 1 MG PO TABS
2.0000 mg | ORAL_TABLET | Freq: Four times a day (QID) | ORAL | Status: DC | PRN
  Administered 2022-02-07: 2 mg via ORAL
  Filled 2022-02-07: qty 2

## 2022-02-07 MED ORDER — SENNOSIDES-DOCUSATE SODIUM 8.6-50 MG PO TABS: 1.0000 | ORAL_TABLET | Freq: Every evening | ORAL | Status: DC | PRN

## 2022-02-07 MED ORDER — OLANZAPINE 10 MG PO TBDP: 10.0000 mg | ORAL_TABLET | Freq: Four times a day (QID) | ORAL | Status: DC | PRN

## 2022-02-07 MED ORDER — SODIUM CHLORIDE 0.9% FLUSH
3.0000 mL | Freq: Two times a day (BID) | INTRAVENOUS | Status: DC
  Administered 2022-02-07: 3 mL via INTRAVENOUS

## 2022-02-07 MED ORDER — LORAZEPAM 2 MG/ML IJ SOLN: 2.0000 mg | Freq: Once | INTRAMUSCULAR | Status: AC

## 2022-02-07 MED ORDER — DIPHENHYDRAMINE HCL 25 MG PO CAPS
50.0000 mg | ORAL_CAPSULE | Freq: Four times a day (QID) | ORAL | Status: DC | PRN
  Administered 2022-02-07: 50 mg via ORAL
  Filled 2022-02-07: qty 2

## 2022-02-07 MED ORDER — ALBUTEROL SULFATE HFA 108 (90 BASE) MCG/ACT IN AERS: 2.0000 | INHALATION_SPRAY | RESPIRATORY_TRACT | Status: DC | PRN

## 2022-02-07 MED ORDER — LORAZEPAM 2 MG/ML PO CONC: 2.0000 mg | Freq: Four times a day (QID) | ORAL | Status: DC | PRN

## 2022-02-07 MED ORDER — LORAZEPAM 1 MG PO TABS: 1.0000 mg | ORAL_TABLET | Freq: Four times a day (QID) | ORAL | Status: DC | PRN

## 2022-02-07 MED ORDER — DIPHENHYDRAMINE HCL 50 MG/ML IJ SOLN
INTRAMUSCULAR | Status: AC
  Filled 2022-02-07: qty 1

## 2022-02-07 MED ORDER — OLANZAPINE 10 MG PO TABS
10.0000 mg | ORAL_TABLET | Freq: Every day | ORAL | Status: DC
  Filled 2022-02-07: qty 1

## 2022-02-07 NOTE — Plan of Care (Signed)
Patient has refused to get out of bed this shift   Problem: Education: Goal: Emotional status will improve Outcome: Not Progressing Goal: Mental status will improve Outcome: Not Progressing   Problem: Activity: Goal: Interest or engagement in activities will improve Outcome: Not Progressing   Problem: Coping: Goal: Ability to verbalize frustrations and anger appropriately will improve Outcome: Not Progressing Goal: Ability to demonstrate self-control will improve Outcome: Not Progressing   Problem: Health Behavior/Discharge Planning: Goal: Compliance with treatment plan for underlying cause of condition will improve Outcome: Not Progressing   Problem: Activity: Goal: Interest or engagement in leisure activities will improve Outcome: Not Progressing   Problem: Coping: Goal: Will verbalize feelings Outcome: Not Progressing   Problem: Health Behavior/Discharge Planning: Goal: Compliance with therapeutic regimen will improve Outcome: Not Progressing   Problem: Coping: Goal: Ability to identify and develop effective coping behavior will improve Outcome: Not Progressing Goal: Ability to interact with others will improve Outcome: Not Progressing Goal: Demonstration of participation in decision-making regarding own care will improve Outcome: Not Progressing Goal: Ability to use eye contact when communicating with others will improve Outcome: Not Progressing
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
## 2022-02-07 NOTE — [facility] Suicide Risk Assessment (Signed)
[place] INPATIENT:  Family/Significant Other Suicide Prevention Education  Suicide Prevention Education:  Contact Attempts: Salsabeel Gorelick [phone] (Sister) has been identified by the patient as the family member/significant other with whom the patient will be residing, and identified as the person(s) who will aid the patient in the event of a mental health crisis.  With written consent from the patient, two attempts were made to provide suicide prevention education, prior to and/or following the patient's discharge.  We were unsuccessful in providing suicide prevention education.  A suicide education pamphlet was given to the patient to share with family/significant other.  Date and time of first attempt: 02/07/2022, 2:51pm Date and time of second attempt: 02/07/22 at 10:22am  Per patient, CSW would need to contact her friend Eduard Clos Diedrick [phone] to get her sister's phone number.  Mr. Elam Dutch provided CSW with the phone number listed above.  When contact this individual stated "you have the wrong number".  The Pt states that she does not have her sister's phone number and does not have any other contacts at this time.  The Pt did not provide CSW with consent to gather information from Mr. Elam Dutch and only approved for CSW to get her sister's phone number.  CSW will continue to speak with the Pt about possible phone numbers to complete SPE.   Frutoso Chase Akoni Parton 02/07/2022, 2:51 PM

## 2022-02-07 NOTE — Plan of Care (Signed)
  Problem: Education: Goal: Knowledge of [facility] Education information/materials will improve Outcome: Progressing Goal: Verbalization of understanding the information provided will improve Outcome: Progressing   Problem: Activity: Goal: Sleeping patterns will improve Outcome: Progressing

## 2022-02-07 NOTE — ED Triage Notes (Signed)
Pt at [facility] across the street for agitation. BIB [facility] after episode of unresponsiveness. EMS describes possible syncope after IM ativan. Reports pt was agitated, given PO ativan and threw it up. Remained agitated, and given IM ativan. Next rounding pt was on the floor. [place] period of unresposiveness vs. Decreased repsponsiveness reported. No obvious injuries or complaints. No compressions given. A&O for EMS per norm/ baseline. VSS. EKG unremarkable. Pt awake, sleepy, interactive, NAD, calm, denies pain.

## 2022-02-07 NOTE — Progress Notes (Signed)
Patient came out to the medication window at this time, patient asking for sleeping medication, patient stated, "wait, I want to know what you have first,"  RN explained Prescribed PRN medications per MD orders, and  educated patient, patient stated, I can't take any of those, I have a job to attend as soon as I get out of here. RN educated patient on medication safety, patient stated, "If I was home, I will just take my "weed," Patient refused sleeping medications and returned to her room.

## 2022-02-07 NOTE — Progress Notes (Signed)
1530-patient was heard slamming her door along with other "banging" noises. Staff went to room to to check on patient . This is when staff noticed that the patient broke the STARR assist box off the wall. When asked why she pulled it off the wall she told staff that "she doesn't want to be here (hospital)". Decision was made by MD to remove patient from room and to be transferred to another hall. While new room was being prepared the patient sat on the floor where staff had difficulty seeing her. Patient was explained that she was going to need to take medication to settle down which she initially refused. When explained that she did not have a choice at this time on how she was going to take the medications, either PO or IM, she chose to take the pills by mouth (time 1630). When staff was making sure that she swallowed the pills she aggressively knocked her cup towards staff. Patient was able to get off the floor by herself and was unassisted by staff to new unit. Upon arrival in new room staff observed patient making herself vomit the pills.

## 2022-02-07 NOTE — BH Assessment (Signed)
Shift Assessment   0700-1530  Patient refusing to get out of bed this shift. Patient compliant with morning medications. She continued to yell and cuss at staff when they needed to enter the room. Staff monitored patient throughout shift with Q15 observation checks.

## 2022-02-07 NOTE — Progress Notes (Signed)
Patient ID: Alexandra Sanchez, female   DOB: Aug 26, 1965, 57 y.o.   MRN: [id number] Patient on the unit eating dinner in no acute distress. When staff approached by patient patient asking to return to room. Pt with known hx of anorexia so room lock out appropriate due to patient recent episode of self inducing vomiting just witnessed by staff earlier.  Patient was talking and appropriate with staff but then requesting to return to room. Staff walked down hallway and out of direct line of sight, when staff returned to the hallway patient found in the floor, and then began seizure like activity in the hallway. Staff immediately intervened, placing patient on the side, calling code blue and placing patient on oxygen. Order received for ATIVAN 2MG  IM from Garrison Columbus NP who arrived on site. Patient received ativan Im to right deltoid without issue. Patient was observed to be awake, alert. No incontinence or post ictal state noted, as patient was speaking with staff and EMT minutes after event. Order received for patient to transfer to ED for further evaluation due to hx of seizures and med noncompliance. AC present for event and aware.  Pt escorted from from unit via ems in stable condition via stretcher.
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
## 2022-02-07 NOTE — Progress Notes (Signed)
MD notified of patients agitation and that when she was moved to [facility] pt went to room and vomited pills. Pt behaviors at this point calm, no violence or destruction to property. MD reports continue to monitor and administer IM should pt escalate.  Patient is observed in dayroom, attention seeking stating '' I can do a headstand and make my blood pressure go up. '' Pt then does back flip in nurses station and sits down laughing. Pt redirected verbally. Pt is safe, will con't to monitor.
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
## 2022-02-07 NOTE — BHH Group Notes (Signed)
Adult Psychoeducational Group Note  Date:  02/07/2022 Time:  3:22 PM  Group Topic/Focus:  Goals Group:   The focus of this group is to help patients establish daily goals to achieve during treatment and discuss how the patient can incorporate goal setting into their daily lives to aide in recovery.  Participation Level:  Did Not Attend  Richardean Chimera 02/07/2022, 3:22 PM

## 2022-02-07 NOTE — Hospital Course (Signed)
Alexandra Sanchez is a 57 y.o. female with medical history significant for COPD, history of seizures, depression/anxiety who is admitted from inpatient behavioral health for evaluation of seizure-like activity.
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
## 2022-02-07 NOTE — ED Provider Notes (Signed)
[place] EMERGENCY DEPARTMENT AT [facility] Provider Note   CSN: [id number] Arrival date & time: 02/07/22  1843     History  Chief Complaint  Patient presents with   Loss of Consciousness    Alexandra Sanchez is a 57 y.o. female.   Loss of Consciousness Patient presents from behavioral health hospital.  There for suicidal thoughts and anorexia.  Reportedly had had some Ativan for acting out.  Acted out more and then had an unresponsive episode.  Reportedly had seizure activity on the ground.  Given IM Ativan.  However reportedly quickly was awake and not postictal.  Patient states she had a seizure.  Reviewed notes seen Dr. Suzie Portela from neurology previously.  Had features of both actual seizures and nonepileptic seizures.  Had plans for EEG but did not been.  Also has had cardiology visit with plan for a stress test due to some chest pain.  Reported had some syncopal episodes also.  Patient states her last seizure was about a month ago.    Past Medical History:  Diagnosis Date   Anxiety disorder    COPD (chronic obstructive pulmonary disease) ([place])    Depression    Hyperlipidemia    Seizures ([place]) 08/2018    Home Medications Prior to Admission medications   Medication Sig Start Date End Date Taking? Authorizing Provider  atorvastatin (LIPITOR) 40 MG tablet Take 40 mg by mouth daily.    [provider]  betamethasone valerate ointment (VALISONE) 0.1 % Apply topically 2 (two) times daily. Patient not taking: Reported on 02/03/2022 01/11/22   [provider]  gabapentin (NEURONTIN) 300 MG capsule Take 300 mg by mouth at bedtime. Patient not taking: Reported on 02/03/2022    [provider]  ibuprofen (ADVIL) 200 MG tablet Take 800 mg by mouth every 6 (six) hours as needed for fever, headache or mild pain.    [provider]  levETIRAcetam (KEPPRA) 500 MG tablet Take 1 tablet (500 mg total) by mouth 2 (two) times daily. Patient not  taking: Reported on 10/16/2021 03/20/20   Sharion Settler, DO  lidocaine (LIDODERM) 5 % APPLY 1 PATCH BY TRANSDERMAL ROUTE ONCE DAILY (MAY WEAR UP TO 12HOURS.) Patient not taking: Reported on 02/03/2022    [provider]  PROAIR HFA 108 (90 Base) MCG/ACT inhaler 2 puffs every 4 (four) hours as needed. 06/10/20   [provider]  propranolol (INDERAL) 40 MG tablet Take 40 mg by mouth 2 (two) times daily. Patient not taking: Reported on 02/03/2022    [provider]  QUEtiapine (SEROQUEL) 25 MG tablet Take 1 tablet every day by oral route at bedtime for 30 days. Patient not taking: Reported on 02/03/2022 03/29/17   [provider]  SYMBICORT 160-4.5 MCG/ACT inhaler SMARTSIG:2 Puff(s) By Mouth Twice Daily Patient not taking: Reported on 02/03/2022 11/08/21   [provider]  triamcinolone cream (KENALOG) 0.1 % APPLY THIN COAT TO AFFECTED AREA TWICE A DAY Patient not taking: Reported on 02/03/2022    [provider]  venlafaxine XR (EFFEXOR-XR) 75 MG 24 hr capsule TAKE 1 CAPSULE BY MOUTH EVERY DAY IN THE MORNING Patient not taking: Reported on 02/03/2022    [provider]      Allergies    Aspirin, Bee pollen, Haloperidol lactate, Latex, and Erythromycin    Review of Systems   Review of Systems  Cardiovascular:  Positive for syncope.    Physical Exam Updated Vital Signs BP 114/71   Pulse  83   Temp 98.4 F (36.9 C) (Oral)   Resp 18   Ht 5\' 2"  (1.575 m)   Wt 47.2 kg   SpO2 92%   BMI 19.02 kg/m  Physical Exam Vitals and nursing note reviewed.  HENT:     Head: Atraumatic.  Eyes:     Pupils: Pupils are equal, round, and reactive to light.  Cardiovascular:     Rate and Rhythm: Regular rhythm.  Pulmonary:     Effort: Pulmonary effort is normal.  Abdominal:     Tenderness: There is no abdominal tenderness.  Musculoskeletal:        General: No tenderness.  Skin:    General: Skin is warm.  Neurological:     Mental Status:  She is alert.     Comments: Moving all extremities but somewhat slow to answer questions.     ED Results / Procedures / Treatments   Labs (all labs ordered are listed, but only abnormal results are displayed) Labs Reviewed  LIPID PANEL - Abnormal; Notable for the following components:      Result Value   Triglycerides 198 (*)    HDL 36 (*)    All other components within normal limits  GLUCOSE, CAPILLARY - Abnormal; Notable for the following components:   Glucose-Capillary 175 (*)    All other components within normal limits  BASIC METABOLIC PANEL - Abnormal; Notable for the following components:   Glucose, Bld 138 (*)    All other components within normal limits  CBC - Abnormal; Notable for the following components:   RBC 5.37 (*)    Hemoglobin 15.5 (*)    All other components within normal limits  TSH  HEMOGLOBIN A1C  TROPONIN I (HIGH SENSITIVITY)  TROPONIN I (HIGH SENSITIVITY)    EKG None  Radiology CT HEAD WO CONTRAST (5MM)  Result Date: 02/07/2022 CLINICAL DATA:  Seizure disorder EXAM: CT HEAD WITHOUT CONTRAST TECHNIQUE: Contiguous axial images were obtained from the base of the skull through the vertex without intravenous contrast. RADIATION DOSE REDUCTION: This exam was performed according to the departmental dose-optimization program which includes automated exposure control, adjustment of the mA and/or kV according to patient size and/or use of iterative reconstruction technique. COMPARISON:  06/01/2021 FINDINGS: Brain: No evidence of acute infarction, hemorrhage, hydrocephalus, extra-axial collection or mass lesion/mass effect. Mild subcortical white matter and periventricular small vessel ischemic changes. Vascular: No hyperdense vessel or unexpected calcification. Skull: Normal. Negative for fracture or focal lesion. Sinuses/Orbits: The visualized paranasal sinuses are essentially clear. The mastoid air cells are unopacified. Other: None. IMPRESSION: No evidence of  acute intracranial abnormality. Mild small vessel ischemic changes. Electronically Signed   By: Julian Hy M.D.   On: 02/07/2022 20:06    Procedures Procedures    Medications Ordered in ED Medications  acetaminophen (TYLENOL) tablet 650 mg (has no administration in time range)  alum & mag hydroxide-simeth (MAALOX/MYLANTA) 200-200-20 MG/5ML suspension 30 mL (has no administration in time range)  magnesium hydroxide (MILK OF MAGNESIA) suspension 30 mL (has no administration in time range)  hydrOXYzine (ATARAX) tablet 25 mg (has no administration in time range)  traZODone (DESYREL) tablet 50 mg (has no administration in time range)  FLUoxetine (PROZAC) capsule 20 mg (20 mg Oral Given 02/07/22 0839)  levETIRAcetam (KEPPRA) tablet 500 mg (500 mg Oral Given 02/07/22 1946)  OLANZapine (ZYPREXA) tablet 10 mg (0 mg Oral Duplicate 02/20/06 6578)  chlorproMAZINE (THORAZINE) injection 50 mg (has no administration in time range)  diphenhydrAMINE (BENADRYL) capsule  50 mg (has no administration in time range)    Or  diphenhydrAMINE (BENADRYL) injection 50 mg (has no administration in time range)  OLANZapine zydis (ZYPREXA) disintegrating tablet 10 mg (has no administration in time range)    And  LORazepam (ATIVAN) tablet 1 mg (has no administration in time range)  nicotine (NICODERM CQ - dosed in mg/24 hours) patch 14 mg (14 mg Transdermal Patch Applied 02/07/22 1733)  OLANZapine zydis (ZYPREXA) 10 MG disintegrating tablet (0 mg  Duplicate 0/81/44 8185)  diphenhydrAMINE (BENADRYL) 50 MG/ML injection (  Duplicate 6/31/49 7026)  LORazepam (ATIVAN) 2 MG/ML injection (2 mg Intramuscular Given 02/07/22 1814)  LORazepam (ATIVAN) injection 2 mg (0 mg Intramuscular Duplicate 3/78/58 8502)    ED Course/ Medical Decision Making/ A&P                             Medical Decision Making Amount and/or Complexity of Data Reviewed Labs: ordered. Radiology: ordered.   Patient with unresponsive episode.   Questionable seizure activity.  Has had  seizures in the past reportedly.  However was not postictal after the episode.  Reviewing chart has had previous syncopal episodes 2 but also previous seizures.  Discussed with Dr.Bhagat from neurology.  She would recommend not loading right now without further seizure activity.  Will require EEG.  Will require transfer to [facility] but can be admitted through [facility].  Will get basic blood work and skewing head CT.  Potentially would need further evaluation with the potential cardiac cause also.  Head CT reassuring.  Blood work reassuring.  No further seizure-like activity.  Will discuss with hospitalist for admission to [facility]        Final Clinical Impression(s) / ED Diagnoses Final diagnoses:  Seizure-like activity [facility])    Rx / [place] Orders ED Discharge Orders     None         Davonna Belling, MD 02/07/22 2109

## 2022-02-07 NOTE — Progress Notes (Signed)
   02/06/22 2030  Psych Admission Type (Psych Patients Only)  Admission Status Voluntary  Psychosocial Assessment  Patient Complaints Irritability;Insomnia  Eye Contact Avoids  Facial Expression Angry;Sad  Affect Angry  Speech Logical/coherent  Interaction Avoidant;Minimal  Motor Activity Other (Comment)  Appearance/Hygiene In scrubs  Behavior Characteristics Unwilling to participate  Mood Angry;Sad  Thought Process  Coherency Circumstantial  Content Blaming others  Delusions None reported or observed  Perception UTA  Hallucination None reported or observed  Judgment Poor  Confusion None  Danger to Self  Current suicidal ideation? Denies  Danger to Others  Danger to Others None reported or observed

## 2022-02-07 NOTE — BH IP Treatment Plan (Signed)
Interdisciplinary Treatment and Diagnostic Plan Update  02/07/2022 Time of Session: 9:25am  Alexandra Sanchez MRN: [id number]  Principal Diagnosis: Anorexia nervosa  Secondary Diagnoses: Principal Problem:   Anorexia nervosa Active Problems:   MDD (major depressive disorder)   Seizures (HCC)   Anxiety state   Tobacco use disorder   Current Medications:  Current Facility-Administered Medications  Medication Dose Route Frequency Provider Last Rate Last Admin   acetaminophen (TYLENOL) tablet 650 mg  650 mg Oral Q6H PRN Derrill Center, NP       alum & mag hydroxide-simeth (MAALOX/MYLANTA) 200-200-20 MG/5ML suspension 30 mL  30 mL Oral Q4H PRN Derrill Center, NP       OLANZapine zydis (ZYPREXA) disintegrating tablet 5 mg  5 mg Oral Q6H PRN Winfred Leeds, Nadir, MD       And   LORazepam (ATIVAN) tablet 1 mg  1 mg Oral Q6H PRN Winfred Leeds, Nadir, MD       And   diphenhydrAMINE (BENADRYL) capsule 25 mg  25 mg Oral Q6H PRN Winfred Leeds, Nadir, MD       FLUoxetine (PROZAC) capsule 20 mg  20 mg Oral Daily Massengill, Nathan, MD   20 mg at 02/07/22 0626   hydrOXYzine (ATARAX) tablet 25 mg  25 mg Oral TID PRN Derrill Center, NP       levETIRAcetam (KEPPRA) tablet 500 mg  500 mg Oral Q12H Massengill, Ovid Curd, MD   500 mg at 02/07/22 9485   magnesium hydroxide (MILK OF MAGNESIA) suspension 30 mL  30 mL Oral Daily PRN Derrill Center, NP       nicotine polacrilex (NICORETTE) gum 2 mg  2 mg Oral PRN Alesia Morin, MD       traZODone (DESYREL) tablet 50 mg  50 mg Oral QHS PRN Massengill, Ovid Curd, MD       PTA Medications: Medications Prior to Admission  Medication Sig Dispense Refill Last Dose   atorvastatin (LIPITOR) 40 MG tablet Take 40 mg by mouth daily.      betamethasone valerate ointment (VALISONE) 0.1 % Apply topically 2 (two) times daily. (Patient not taking: Reported on 02/03/2022)      gabapentin (NEURONTIN) 300 MG capsule Take 300 mg by mouth at bedtime. (Patient not taking: Reported on 02/03/2022)       ibuprofen (ADVIL) 200 MG tablet Take 800 mg by mouth every 6 (six) hours as needed for fever, headache or mild pain.      levETIRAcetam (KEPPRA) 500 MG tablet Take 1 tablet (500 mg total) by mouth 2 (two) times daily. (Patient not taking: Reported on 10/16/2021) 30 tablet 1    lidocaine (LIDODERM) 5 % APPLY 1 PATCH BY TRANSDERMAL ROUTE ONCE DAILY (MAY WEAR UP TO 12HOURS.) (Patient not taking: Reported on 02/03/2022)      PROAIR HFA 108 (90 Base) MCG/ACT inhaler 2 puffs every 4 (four) hours as needed.      propranolol (INDERAL) 40 MG tablet Take 40 mg by mouth 2 (two) times daily. (Patient not taking: Reported on 02/03/2022)      QUEtiapine (SEROQUEL) 25 MG tablet Take 1 tablet every day by oral route at bedtime for 30 days. (Patient not taking: Reported on 02/03/2022)      SYMBICORT 160-4.5 MCG/ACT inhaler SMARTSIG:2 Puff(s) By Mouth Twice Daily (Patient not taking: Reported on 02/03/2022)      triamcinolone cream (KENALOG) 0.1 % APPLY THIN COAT TO AFFECTED AREA TWICE A DAY (Patient not taking: Reported on 02/03/2022)  venlafaxine XR (EFFEXOR-XR) 75 MG 24 hr capsule TAKE 1 CAPSULE BY MOUTH EVERY DAY IN THE MORNING (Patient not taking: Reported on 02/03/2022)       Patient Stressors: Financial difficulties   Health problems   Marital or family conflict   Medication change or noncompliance    Patient Strengths: Ability for insight  Supportive family/friends   Treatment Modalities: Medication Management, Group therapy, Case management,  1 to 1 session with clinician, Psychoeducation, Recreational therapy.   Physician Treatment Plan for Primary Diagnosis: Anorexia nervosa Long Term Goal(s):     [place] Term Goals: Ability to identify changes in lifestyle to reduce recurrence of condition will improve Ability to verbalize feelings will improve Ability to disclose and discuss suicidal ideas Ability to demonstrate self-control will improve Ability to identify and develop effective coping  behaviors will improve Ability to maintain clinical measurements within normal limits will improve Compliance with prescribed medications will improve Ability to identify triggers associated with substance abuse/mental health issues will improve  Medication Management: Evaluate patient's response, side effects, and tolerance of medication regimen.  Therapeutic Interventions: 1 to 1 sessions, Unit Group sessions and Medication administration.  Evaluation of Outcomes: Not Met  Physician Treatment Plan for Secondary Diagnosis: Principal Problem:   Anorexia nervosa Active Problems:   MDD (major depressive disorder)   Seizures (HCC)   Anxiety state   Tobacco use disorder  Long Term Goal(s):     [place] Term Goals: Ability to identify changes in lifestyle to reduce recurrence of condition will improve Ability to verbalize feelings will improve Ability to disclose and discuss suicidal ideas Ability to demonstrate self-control will improve Ability to identify and develop effective coping behaviors will improve Ability to maintain clinical measurements within normal limits will improve Compliance with prescribed medications will improve Ability to identify triggers associated with substance abuse/mental health issues will improve     Medication Management: Evaluate patient's response, side effects, and tolerance of medication regimen.  Therapeutic Interventions: 1 to 1 sessions, Unit Group sessions and Medication administration.  Evaluation of Outcomes: Not Met   RN Treatment Plan for Primary Diagnosis: Anorexia nervosa Long Term Goal(s): Knowledge of disease and therapeutic regimen to maintain health will improve  [place] Term Goals: Ability to remain free from injury will improve, Ability to participate in decision making will improve, Ability to verbalize feelings will improve, Ability to disclose and discuss suicidal ideas, and Ability to identify and develop effective coping behaviors  will improve  Medication Management: RN will administer medications as ordered by provider, will assess and evaluate patient's response and provide education to patient for prescribed medication. RN will report any adverse and/or side effects to prescribing provider.  Therapeutic Interventions: 1 on 1 counseling sessions, Psychoeducation, Medication administration, Evaluate responses to treatment, Monitor vital signs and CBGs as ordered, Perform/monitor CIWA, COWS, AIMS and Fall Risk screenings as ordered, Perform wound care treatments as ordered.  Evaluation of Outcomes: Not Met   LCSW Treatment Plan for Primary Diagnosis: Anorexia nervosa Long Term Goal(s): Safe transition to appropriate next level of care at discharge, Engage patient in therapeutic group addressing interpersonal concerns.  [place] Term Goals: Engage patient in aftercare planning with referrals and resources, Increase social support, Increase emotional regulation, Facilitate acceptance of mental health diagnosis and concerns, Identify triggers associated with mental health/substance abuse issues, and Increase skills for wellness and recovery  Therapeutic Interventions: Assess for all discharge needs, 1 to 1 time with Social worker, Explore available resources and support systems, Assess for adequacy  in community support network, Educate family and significant other(s) on suicide prevention, Complete Psychosocial Assessment, Interpersonal group therapy.  Evaluation of Outcomes: Not Met   Progress in Treatment: Attending groups: No. Participating in groups: No. Taking medication as prescribed: Yes. Toleration medication: Yes. Family/Significant other contact made: Yes, individual(s) contacted:  Daughter Patient understands diagnosis: No. Discussing patient identified problems/goals with staff: Yes. Medical problems stabilized or resolved: Yes. Denies suicidal/homicidal ideation: Yes.   New problem(s) identified: No,  Describe:  None   New [place] Term/Long Term Goal(s): medication stabilization, elimination of SI thoughts, development of comprehensive mental wellness plan.   Patient Goals: "To get out of here"  Discharge Plan or Barriers: Patient recently admitted. CSW will continue to follow and assess for appropriate referrals and possible discharge planning.   Reason for Continuation of Hospitalization: Anxiety Depression Medication stabilization Suicidal ideation  Estimated Length of Stay: 3 to 7 days   Last 3 [place] Suicide Severity Risk Score: [place] ED from 02/03/2022 in [facility] Department at [facility] ED to [place] (Discharged) from 03/19/2020 in [place] Moderate Risk No Risk       Last PHQ 2/9 Scores:     No data to display          Scribe for Treatment Team: Carney Harder 02/07/2022 1:37 PM

## 2022-02-07 NOTE — Progress Notes (Signed)
Patient ID: Alexandra Sanchez, female   DOB: [birth year], 57 y.o.   MRN: [id number]  At 6:30 pm today, Unit emergency to 500 hall was paged. On arrival to the area, the patient [name] was observed lying on the floor to her left side on the hallway, unresponsive to commands. On examination, skin W/D/I & pink. BP 122/74, Pulse 100, O2 Saturation 92% on room air. Ammonia pad was obtained from the emergency kit and placed under patient nose to trigger a reflex that causes the patient to breath deeply in and out to clear nasal passages. O2 at 2L/min via  was started. Ativan 2 mg IM was ordered in case of pseudo seizures, and administered prior to arrival of the EMS. Patient was responsive prior to leaving [facility]. The patient was transferred to [facility] for further evaluation and [place]. The [facility] Medical Director was made aware of this incidence by the Nursing [facility].   Garrison [place], NP

## 2022-02-07 NOTE — ED Notes (Signed)
Pt accompanied to BR with one staff assist, then pt assisted back to bed, placed back on cardiac monitoring devices, Dr. Posey Pronto (attending) at the bedside to assess pt for inpatient admission

## 2022-02-07 NOTE — Progress Notes (Addendum)
Pt observed slamming hallway phone following conversation with daughter. Patient proceeded to stomp down hallway and slam bedroom and bathroom doors. RN and MHT approached patient and offered support and encouragement. Patient proceeded to state "what are you gonna do, try to give me meds? I don't want your fucking help. Get the fuck out of my room bitch". RN told patient to stop slamming things in her room.

## 2022-02-07 NOTE — ED Triage Notes (Signed)
New encounter started as pt had to be discharge from system as she was under [facility] so that pt can be transfer to [facility] for continuous of care. Please refer to notes/encounter from earlier today in pt's EMR.     Lurlean Horns, RN  Registered Nurse   ED Triage Notes     Signed   Date of Service: 02/07/2022  6:53 PM  Related encounter: ED from 02/05/2022 in [facility] Emergency Department at [place] at [facility] across the street for agitation. BIB [facility] after episode of unresponsiveness. EMS describes possible syncope after IM ativan. Reports pt was agitated, given PO ativan and threw it up. Remained agitated, and given IM ativan. Next rounding pt was on the floor. Diop period of unresposiveness vs. Decreased repsponsiveness reported. No obvious injuries or complaints. No compressions given. A&O for EMS per norm/ baseline. VSS. EKG unremarkable. Pt awake, sleepy, interactive, NAD, calm, denies pain.

## 2022-02-07 NOTE — ED Notes (Signed)
Pt appears to be comfortable and resting, can observe even RR that are unlabored, pt remains on cardiac monitoring devices, no changes noted, side rails up x2 for safety, NAD noted, plan of care ongoing, call light within reach, no further concerns as of present.

## 2022-02-08 DIAGNOSIS — E860 Dehydration: Secondary | ICD-10-CM

## 2022-02-08 DIAGNOSIS — R45851 Suicidal ideations: Secondary | ICD-10-CM | POA: Diagnosis not present

## 2022-02-08 DIAGNOSIS — F329 Major depressive disorder, single episode, unspecified: Secondary | ICD-10-CM | POA: Diagnosis not present

## 2022-02-08 DIAGNOSIS — J449 Chronic obstructive pulmonary disease, unspecified: Secondary | ICD-10-CM | POA: Diagnosis not present

## 2022-02-08 DIAGNOSIS — F411 Generalized anxiety disorder: Secondary | ICD-10-CM

## 2022-02-08 DIAGNOSIS — R569 Unspecified convulsions: Secondary | ICD-10-CM | POA: Diagnosis not present

## 2022-02-08 LAB — CBC
HCT: 43.7 % (ref 36.0–46.0)
Hemoglobin: 14.7 g/dL (ref 12.0–15.0)
MCH: 28.4 pg (ref 26.0–34.0)
MCHC: 33.6 g/dL (ref 30.0–36.0)
MCV: 84.4 fL (ref 80.0–100.0)
Platelets: UNDETERMINED 10*3/uL (ref 150–400)
RBC: 5.18 MIL/uL — ABNORMAL HIGH (ref 3.87–5.11)
RDW: 12.4 % (ref 11.5–15.5)
WBC: 6.9 10*3/uL (ref 4.0–10.5)
nRBC: 0 % (ref 0.0–0.2)

## 2022-02-08 LAB — BASIC METABOLIC PANEL
Anion gap: 8 (ref 5–15)
BUN: 10 mg/dL (ref 6–20)
CO2: 23 mmol/L (ref 22–32)
Calcium: 8.7 mg/dL — ABNORMAL LOW (ref 8.9–10.3)
Chloride: 105 mmol/L (ref 98–111)
Creatinine, Ser: 0.59 mg/dL (ref 0.44–1.00)
GFR, Estimated: >60 mL/min (ref >60)
Glucose, Bld: 89 mg/dL (ref 70–99)
Potassium: 3.9 mmol/L (ref 3.5–5.1)
Sodium: 136 mmol/L (ref 135–145)

## 2022-02-08 LAB — HIV ANTIBODY (ROUTINE TESTING W REFLEX): HIV Screen 4th Generation wRfx: NONREACTIVE

## 2022-02-08 LAB — HEMOGLOBIN A1C
Hgb A1c MFr Bld: 5.2 % (ref 4.8–5.6)
Mean Plasma Glucose: 102.54 mg/dL

## 2022-02-08 MED ORDER — SODIUM CHLORIDE 0.9 % IV SOLN: INTRAVENOUS | Status: DC

## 2022-02-08 MED ORDER — GUAIFENESIN 100 MG/5ML PO LIQD
10.0000 mL | ORAL | Status: DC | PRN
  Administered 2022-02-08: 10 mL via ORAL
  Filled 2022-02-08: qty 10

## 2022-02-08 NOTE — ED Notes (Signed)
Per [facility], pt will need to be IVC as pt has been threatening to leave and that she technically has not been cleared by behavioral health. NP Raenette Rover to work on [facility], charge RN [place] is aware.  [facility] called, updated of pt's need for IV status, will need [facility] to for transport as well, advised to please call 30-45 mins PTA so we can attempt to arrange an LEO for transport

## 2022-02-08 NOTE — ED Notes (Signed)
Pt has removed her monitoring cords again and removed her IV... pt with unsteady gait as she has climb out of bed from foot of bed, pt was assisted to BR with this RN, pt unsteady gait, pt then assisted back to bed, side rails up x2, for safety, pt instructed to stay in bed and to please leave monitoring deices in place. Pt updated of plan of care and transfer to [facility] once her bed is ready. Message to be sent to floor coverage regarding pt's removal of IV and constant removal of monitoring devices, possible need for non-violent soft restraints.

## 2022-02-08 NOTE — ED Notes (Signed)
Request for cough medication, pt has dry, congested, non-productive cough (smoker's cough). Refer to [facility]

## 2022-02-08 NOTE — ED Notes (Signed)
Pt removed bilateral wrist restraints, pt was seen trying to climb out of bed, pt had removed her BP cuff and her pulse ox. This RN in to room to assist pt and remind pt she needed to stay in bed and keep monitoring device on. Left wrist restraint was ripped in half, when question pt about it she stated "I used my teeth". Pt also removed her purwick and threw it on the floor that was placed by NT [facility].   Pt placed back in bilateral wrist restraints (after new restraint retrieved), pt placed back on monitoring device, repositioned in bed, pt also assisted with ice water to drink.

## 2022-02-08 NOTE — ED Notes (Signed)
Pt has removed monitoring devices, sitter at the bedside, asked if the sitter could assist with helping keep monitoring devices on pt and keep her from removing cords and stickers, sitter stated "I told her not too". Cardiac monitoring, BP cuff, and pulse ox placed back on pt

## 2022-02-08 NOTE — ED Notes (Signed)
Pt given ginger ale to drink, this RN assisted pt

## 2022-02-08 NOTE — Progress Notes (Signed)
Patient seen after arrival to [facility], keeps her head covered and is very noncooperative.  Discussed with psychiatry team who is at bedside also.  She was transferred from behavioral health to [facility] ED for seizure like activity at the [facility].  She was admitted there for eating disorders, depression, anxiety, suicidal ideation.  She currently denies any suicidal ideation.  She was attempting to leave from [facility] ED overnight last night.  Per psychiatry no need to IVC, and she can leave if she wants to.  For now awaiting neurology input.  For full details, see Dr. Biagio Borg note earlier this morning  Aaryav Hopfensperger M. Cruzita Lederer, MD, PhD [facility]  Between 7 am - 7 pm you can contact me via Amion (for emergencies) or [place] (non urgent matters).  I am not available 7 pm - 7 am, please contact night coverage MD/APP via Amion

## 2022-02-08 NOTE — ED Notes (Signed)
Was advised that [facility] does not have a truck at this time to send for transport, will probably be day shift crew to transfer pt to [facility]NP09. Message sent to receiving RN Racheal as FYI. Charge RN Rosalie also notified of delay.

## 2022-02-08 NOTE — ED Notes (Signed)
Per Dr. Marlowe Sax pt is NOT needed to be IVC at this time since she is NOT ACTIVELY trying to leave, only verbalizing leaving. If pt tries to leave, then will need for IVC process to start.

## 2022-02-08 NOTE — ED Notes (Signed)
Sitter has left, charge RN aware that pt's sitter is leaving and going back to [facility], no replacement at this time, charge stated pt does not need sitter as she is under medical now.

## 2022-02-08 NOTE — ED Notes (Signed)
[facility] called, transport has been arrange, will come for pt once a truck becomes available. Receiving RN Racheal F, updated.

## 2022-02-08 NOTE — Progress Notes (Addendum)
CROSS COVER NOTE  NAME: Alexandra Sanchez MRN: [id number] DOB : March 31, 1965   Notified by bedside RN of patient verbally stating she is wanting to go home. This patient was admitted from inpatient behavioral health.  [facility] for suicide ideation, depression, and anxiety.  Currently, patient is denying suicide ideation.  During bedside assessment patient is easy to wake up, but is intermittently forgetful and is only oriented x 2 (self and place) during my assessment.  Patient thought it was early January 2004 instead of 2024 and was also unsure as to why she was in the hospital.    She was educated that she was admitted for evaluation of seizure-like activity and understands that further workup is needed before discussing a safe discharge.  The patient at this time was in agreement to remain hospitalized.  She does not appear to be a flight risk at this time.    Bedside RN was updated.  If patient becomes flight risk, IVC paperwork will be needed.  Raenette Rover, DNP, [place]

## 2022-02-08 NOTE — ED Notes (Signed)
NP Raenette Rover at the bedside to consult pt

## 2022-02-08 NOTE — ED Notes (Signed)
Cardiac monitoring placed back on pt, pt has removed all monitoring devices and EKG stickers. Pt redirected to please leave monitoring deceives in place, pt rolls over and shuts her eyes

## 2022-02-08 NOTE — ED Notes (Signed)
UPDATE: [facility] stated they should arrive within a hour for transport, updated unit that pt is currently NOT IVC, at this time Dr. Marlowe Sax will not start IVC paperwork, unless pt becomes a flight risk.

## 2022-02-08 NOTE — ED Notes (Signed)
Purewick placed on pt at this time.  °

## 2022-02-08 NOTE — Consult Note (Signed)
  Alexandra Sanchez is a 57 y.o. female with medical history significant for COPD, history of seizures, depression/anxiety who presented to the ED from inpatient behavioral health for evaluation of seizure-like activity.  History is limited from patient due to somnolence and is otherwise supplemented by EDP and chart review. Patient was admitted to behavioral health on 1/30 for management of suicidal ideation, depression, and anxiety.  Per review of notes she has been verbally abusive to staff.  Also noted to have self-induced vomiting witnessed by staff earlier. After she had dinner she was speaking appropriate with staff and requesting to return to her room.  She walked down the hallway out of direct line of sight by staff but when staff returned they saw that she was lying on the floor and had reported seizure-like activity. She was given IM Ativan 2 mg.  She was observed to be awake, alert.  No incontinence or postictal state was noted.  She was speaking with staff and EMT minutes after the event.  She was subsequently transferred to the ED for further evaluation. Per review of records, patient has history of epileptic and nonepileptic seizures.  She has not been on her antiepileptics for at least a month until she was started on Keppra 500 mg twice daily yesterday while in behavioral health.   Psych consult was placed for follow-up from behavioral health Hospital.  On today's reassessment patient did not present with any focal neurological deficit, she was able to speak clearly, follow commands, and denies weakness.   At this current time, patient's pre-existing condition that was substantially aggravated prior to this admission has returned to previous level.  Patient further states that her current health at this present time seems to be normal as she is noted to feel better and ready to go.  She was very disgruntled and irritable throughout.  She spoke in a argumentative tone, and did not answer all  psychiatric related questions.  Patient states her current agitation, is due to her ball python dying.  She states she went to behavioral health for her eating disorder, and she has an appointment scheduled with her outpatient psychiatrist for further management of this condition.  She denies any suicidal intent, suicidal ideation, and or recent suicide attempt.  She further denies any starvation and or restricting of caloric intake as a suicide attempt.  She denies any likelihood of seizure.  She does not present with any decrease in attention or concentration, she is alert and oriented x 4, shows no Geyer-term memory deficit.  Initially she did not wish to engage with this provider and primary team, covers word pulled up over her head.  And refused physical examination.  She is not lethargic on today's exam, shows linear thought processes, and answers all questions appropriately.  At this time it is felt that patient has returned to psychiatric baseline, and will be stable to discharge home. SHe denies suicidal ideation, homicidal ideation, and or auditory or visual hallucinations.  Psychiatric consult service to sign off at this time.  Communication has been made with the following recommendations to primary team.

## 2022-02-08 NOTE — Discharge Summary (Signed)
Physician Against Medical Advice Discharge Summary  Alexandra Sanchez QMG:[id number] DOB: [birth year] DOA: 02/07/2022  [facility]: Ledell N[facility]  Admit date: 02/07/2022 Discharge date: 02/08/2022  Admitted From: home Disposition:  left AMA  Recommendations for Outpatient Follow-up:  Follow up with PCP ASAP  Discharge Condition: guarded CODE STATUS: Full code  Diet Orders (From admission, onward)     Start     Ordered   02/07/22 2218  Diet regular Room service appropriate? Yes; Fluid consistency: Thin  Diet effective now       Question Answer Comment  Room service appropriate? Yes   Fluid consistency: Thin      02/07/22 2218            HPI: Per admitting MD, Alexandra Sanchez is a 57 y.o. female with medical history significant for COPD, history of seizures, depression/anxiety who presented to the ED from inpatient behavioral health for evaluation of seizure-like activity.  History is limited from patient due to somnolence and is otherwise supplemented by EDP and chart review. Patient was admitted to behavioral health on 1/30 for management of suicidal ideation, depression, and anxiety.  Per review of notes she has been verbally abusive to staff.  Also noted to have self-induced vomiting witnessed by staff earlier. After she had dinner she was speaking appropriate with staff and requesting to return to her room.  She walked down the hallway out of direct line of sight by staff but when staff returned they saw that she was lying on the floor and had reported seizure-like activity. She was given IM Ativan 2 mg.  She was observed to be awake, alert.  No incontinence or postictal state was noted.  She was speaking with staff and EMT minutes after the event.  She was subsequently transferred to the ED for further evaluation. Per review of records, patient has history of epileptic and nonepileptic seizures.  She has not been on her antiepileptics for at least a month until she was started on  Keppra 500 mg twice daily yesterday while in behavioral health.  Hospital Course / Discharge diagnoses: Principal Problem:   Seizure-like activity (Rafael Hernandez) Active Problems:   Suicidal ideation   MDD (major depressive disorder)   Anxiety state   COPD (chronic obstructive pulmonary disease) ([place])   Dehydration   Principal problem Seizure-like activity -patient has a history of epileptic and nonepileptic seizures, was noted to have possible seizure-like activities at the inpatient behavioral health hospital.  She was admitted to [facility], ER, then transferred to [facility] for neurology consultation.  Upon arrival here and upon neurology evaluation, she refused further care, refused EEG and exam and chose to leave the hospital AMA.  Active problems Depression, anxiety, eating disorder, suicidal ideation -she was during a voluntary admission at [facility] when her seizure-like activity happened and she was brought to the ER.  Psychiatry was consulted and evaluated patient while here at [facility], I discussed with Priscille Loveless FNP, and without active suicidal ideation patient free to go if she chooses to do so.  Unfortunately, patient wants to leave the Hospital immidiately, patient has been warned that this is not Medically advisable at this time, and can result in Medical complications like Death and Disability, patient understands and accepts the risks involved and assumes full responsibilty of this decision.  She is alert and oriented x 4 COPD-stable, no wheezing   Sepsis ruled out   Discharge Instructions   Allergies as of 02/08/2022  Reactions   Aspirin Shortness Of Breath   swelling   Bee Pollen Other (See Comments)   STOPS BREATHING STOPS BREATHING   Haloperidol Lactate Shortness Of Breath   Latex Shortness Of Breath   Swelling    Erythromycin Hives   Stomach cramps     Med Rec must be completed prior to using this  [facility]   Consultations: Psychiatry Neurology  Procedures/Studies:  CT HEAD WO CONTRAST (5MM)  Result Date: 02/07/2022 CLINICAL DATA:  Seizure disorder EXAM: CT HEAD WITHOUT CONTRAST TECHNIQUE: Contiguous axial images were obtained from the base of the skull through the vertex without intravenous contrast. RADIATION DOSE REDUCTION: This exam was performed according to the departmental dose-optimization program which includes automated exposure control, adjustment of the mA and/or kV according to patient size and/or use of iterative reconstruction technique. COMPARISON:  06/01/2021 FINDINGS: Brain: No evidence of acute infarction, hemorrhage, hydrocephalus, extra-axial collection or mass lesion/mass effect. Mild subcortical white matter and periventricular small vessel ischemic changes. Vascular: No hyperdense vessel or unexpected calcification. Skull: Normal. Negative for fracture or focal lesion. Sinuses/Orbits: The visualized paranasal sinuses are essentially clear. The mastoid air cells are unopacified. Other: None. IMPRESSION: No evidence of acute intracranial abnormality. Mild small vessel ischemic changes. Electronically Signed   By: Julian Hy M.D.   On: 02/07/2022 20:06   CT ABDOMEN PELVIS W CONTRAST  Result Date: 02/04/2022 CLINICAL DATA:  57 year old female with abdominal pain.  Vomiting. EXAM: CT ABDOMEN AND PELVIS WITH CONTRAST TECHNIQUE: Multidetector CT imaging of the abdomen and pelvis was performed using the standard protocol following bolus administration of intravenous contrast. RADIATION DOSE REDUCTION: This exam was performed according to the departmental dose-optimization program which includes automated exposure control, adjustment of the mA and/or kV according to patient size and/or use of iterative reconstruction technique. CONTRAST:  78mL OMNIPAQUE IOHEXOL 300 MG/ML  SOLN COMPARISON:  [facility] CT Abdomen and Pelvis 12/21/2021. FINDINGS: Lower chest:  Negative. Hepatobiliary: Chronically absent gallbladder with stable generalized bile duct prominence. Liver enhancement remains within normal limits. Pancreas: No pancreatic mass or inflammation. Mild pancreatic ductal prominence is stable. Spleen: Stable with a solitary and circumscribed small 10 mm cystic lesion at the inferior pole of the spleen which is most likely benign *INSERT* macro Adrenals/Urinary Tract: Adrenal glands are within normal limits. Kidneys are symmetrically enhancing and nonobstructed. Symmetric contrast excretion on the delayed images. No nephrolithiasis or renal inflammation identified. Decompressed bladder. Occasional chronic pelvic phleboliths. Stomach/Bowel: Gas and fluid throughout small and large bowel loops in the abdomen and pelvis. No dilated loops. Redundant splenic flexure and transverse colon. Suspicion of generalized mild mucosal enhancement throughout. Appendix is not well delineated, could be surgically absent. No discrete mesenteric inflammation is identified. No free air or free fluid. Stomach is decompressed. Vascular/Lymphatic: Severe Calcified aortic atherosclerosis. Including areas of chronic bulky nodular mural plaque or thrombus (aortic hiatus segment series 2, image 12, infrarenal aorta image 24. Tortuous infrarenal segment with no overt abdominal aortic aneurysm. Major arterial branches remain patent. Portal venous system is patent. No lymphadenopathy identified. Reproductive: Surgically absent uterus. Diminutive or absent ovaries. Other: No pelvic free fluid. Musculoskeletal: No acute osseous abnormality identified. IMPRESSION: 1. Generalized fluid-filled but nondilated small and large bowel loops in the abdomen and pelvis with suspicion of mild mucosal hyperenhancement. Favor acute Enteritis, diarrhea. No evidence of bowel obstruction. 2. No other acute or inflammatory process identified in the abdomen or pelvis. 3. Severe Aortic Atherosclerosis (ICD10-I70.0).  Electronically Signed   By: Lemmie Evens  Margo Aye M.D.   On: 02/04/2022 05:56     The results of significant diagnostics from this hospitalization (including imaging, microbiology, ancillary and laboratory) are listed below for reference.     Microbiology: Recent Results (from the past 240 hour(s))  Resp panel by RT-PCR (RSV, Flu A&B, Covid) Anterior Nasal Swab     Status: None   Collection Time: 02/03/22 11:54 PM   Specimen: Anterior Nasal Swab  Result Value Ref Range Status   SARS Coronavirus 2 by RT PCR NEGATIVE NEGATIVE Final    Comment: (NOTE) SARS-CoV-2 target nucleic acids are NOT DETECTED.  The SARS-CoV-2 RNA is generally detectable in upper respiratory specimens during the acute phase of infection. The lowest concentration of SARS-CoV-2 viral copies this assay can detect is 138 copies/mL. A negative result does not preclude SARS-Cov-2 infection and should not be used as the sole basis for treatment or other patient management decisions. A negative result may occur with  improper specimen collection/handling, submission of specimen other than nasopharyngeal swab, presence of viral mutation(s) within the areas targeted by this assay, and inadequate number of viral copies(<138 copies/mL). A negative result must be combined with clinical observations, patient history, and epidemiological information. The expected result is Negative.  Fact Sheet for Patients:  BloggerCourse.com  Fact Sheet for Healthcare Providers:  SeriousBroker.it  This test is no t yet approved or cleared by the [place] FDA and  has been authorized for detection and/or diagnosis of SARS-CoV-2 by FDA under an Emergency Use Authorization (EUA). This EUA will remain  in effect (meaning this test can be used) for the duration of the COVID-19 declaration under Section 564(b)(1) of the Act, 21 U.S.C.section 360bbb-3(b)(1), unless the authorization is terminated  or  revoked sooner.       Influenza A by PCR NEGATIVE NEGATIVE Final   Influenza B by PCR NEGATIVE NEGATIVE Final    Comment: (NOTE) The Xpert Xpress SARS-CoV-2/FLU/RSV plus assay is intended as an aid in the diagnosis of influenza from Nasopharyngeal swab specimens and should not be used as a sole basis for treatment. Nasal washings and aspirates are unacceptable for Xpert Xpress SARS-CoV-2/FLU/RSV testing.  Fact Sheet for Patients: BloggerCourse.com  Fact Sheet for Healthcare Providers: SeriousBroker.it  This test is not yet approved or cleared by the [place] FDA and has been authorized for detection and/or diagnosis of SARS-CoV-2 by FDA under an Emergency Use Authorization (EUA). This EUA will remain in effect (meaning this test can be used) for the duration of the COVID-19 declaration under Section 564(b)(1) of the Act, 21 U.S.C. section 360bbb-3(b)(1), unless the authorization is terminated or revoked.     Resp Syncytial Virus by PCR NEGATIVE NEGATIVE Final    Comment: (NOTE) Fact Sheet for Patients: BloggerCourse.com  Fact Sheet for Healthcare Providers: SeriousBroker.it  This test is not yet approved or cleared by the [place] FDA and has been authorized for detection and/or diagnosis of SARS-CoV-2 by FDA under an Emergency Use Authorization (EUA). This EUA will remain in effect (meaning this test can be used) for the duration of the COVID-19 declaration under Section 564(b)(1) of the Act, 21 U.S.C. section 360bbb-3(b)(1), unless the authorization is terminated or revoked.  Performed at [facility], [address], [place], [place] [address]   C Difficile Quick Screen w PCR reflex     Status: None   Collection Time: 02/04/22  4:12 AM   Specimen: STOOL  Result Value Ref Range Status   C Diff antigen NEGATIVE NEGATIVE Final  C Diff toxin NEGATIVE NEGATIVE  Final   C Diff interpretation No C. difficile detected.  Final    Comment: Performed at [facility], [address] [place], [place] [address]  Gastrointestinal Panel by PCR , Stool     Status: None   Collection Time: 02/04/22  4:12 AM   Specimen: STOOL  Result Value Ref Range Status   Campylobacter species NOT DETECTED NOT DETECTED Final   Plesimonas shigelloides NOT DETECTED NOT DETECTED Final   Salmonella species NOT DETECTED NOT DETECTED Final   Yersinia enterocolitica NOT DETECTED NOT DETECTED Final   Vibrio species NOT DETECTED NOT DETECTED Final   Vibrio cholerae NOT DETECTED NOT DETECTED Final   Enteroaggregative E coli (EAEC) NOT DETECTED NOT DETECTED Final   Enteropathogenic E coli (EPEC) NOT DETECTED NOT DETECTED Final   Enterotoxigenic E coli (ETEC) NOT DETECTED NOT DETECTED Final   Shiga like toxin producing E coli (STEC) NOT DETECTED NOT DETECTED Final   Shigella/Enteroinvasive E coli (EIEC) NOT DETECTED NOT DETECTED Final   Cryptosporidium NOT DETECTED NOT DETECTED Final   Cyclospora cayetanensis NOT DETECTED NOT DETECTED Final   Entamoeba histolytica NOT DETECTED NOT DETECTED Final   Giardia lamblia NOT DETECTED NOT DETECTED Final   Adenovirus F40/41 NOT DETECTED NOT DETECTED Final   Astrovirus NOT DETECTED NOT DETECTED Final   Norovirus GI/GII NOT DETECTED NOT DETECTED Final   Rotavirus A NOT DETECTED NOT DETECTED Final   Sapovirus (I, II, IV, and V) NOT DETECTED NOT DETECTED Final    Comment: Performed at [facility], [address] [place], [place] [address]     Labs: Basic Metabolic Panel: Recent Labs  Lab 02/03/22 1538 02/04/22 0515 02/07/22 1940 02/08/22 0635  NA 136 137 137 136  K 3.3* 4.3 3.6 3.9  CL 99 109 102 105  CO2 26 20* 27 23  GLUCOSE 95 73 138* 89  BUN 8 10 14 10   CREATININE 0.82 0.70 0.93 0.59  CALCIUM 9.6 8.1* 9.2 8.7*  MG 1.8  --   --   --    Liver Function Tests: Recent Labs  Lab 02/03/22 1538  AST 21  ALT 15   ALKPHOS 142*  BILITOT 0.6  PROT 8.2*  ALBUMIN 4.6   CBC: Recent Labs  Lab 02/03/22 1538 02/04/22 0515 02/07/22 1940 02/08/22 0635  WBC 9.5 11.0* 7.3 6.9  NEUTROABS 5.5  --   --   --   HGB 16.8* 14.4 15.5* 14.7  HCT 49.4* 43.1 45.3 43.7  MCV 85.2 87.1 84.4 84.4  PLT 325 240 284 PLATELET CLUMPS NOTED ON SMEAR, UNABLE TO ESTIMATE   CBG: Recent Labs  Lab 02/07/22 1818  GLUCAP 175*   Hgb A1c Recent Labs    02/07/22 1940  HGBA1C 5.2   Lipid Profile Recent Labs    02/07/22 1940  CHOL 162  HDL 36*  LDLCALC 86  TRIG 198*  CHOLHDL 4.5   Thyroid function studies Recent Labs    02/07/22 0656  TSH 1.003   Urinalysis    Component Value Date/Time   COLORURINE AMBER (A) 02/03/2022 2351   APPEARANCEUR HAZY (A) 02/03/2022 2351   LABSPEC 1.035 (H) 02/03/2022 2351   PHURINE 5.0 02/03/2022 2351   GLUCOSEU NEGATIVE 02/03/2022 2351   HGBUR SMALL (A) 02/03/2022 2351   BILIRUBINUR NEGATIVE 02/03/2022 2351   KETONESUR NEGATIVE 02/03/2022 2351   PROTEINUR 100 (A) 02/03/2022 2351   NITRITE NEGATIVE 02/03/2022 2351   LEUKOCYTESUR TRACE (A) 02/03/2022 2351  FURTHER DISCHARGE INSTRUCTIONS:   Get Medicines reviewed and adjusted: Please take all your medications with you for your next visit with your Primary MD   Laboratory/radiological data: Please request your Primary MD to go over all hospital tests and procedure/radiological results at the follow up, please ask your Primary MD to get all Hospital records sent to his/her office.   In some cases, they will be blood work, cultures and biopsy results pending at the time of your discharge. Please request that your primary care M.D. goes through all the records of your hospital data and follows up on these results.   Also Note the following: If you experience worsening of your admission symptoms, develop shortness of breath, life threatening emergency, suicidal or homicidal thoughts you must seek medical attention  immediately by calling 911 or calling your MD immediately  if symptoms less severe.   You must read complete instructions/literature along with all the possible adverse reactions/side effects for all the Medicines you take and that have been prescribed to you. Take any new Medicines after you have completely understood and accpet all the possible adverse reactions/side effects.    Do not drive when taking Pain medications or sleeping medications (Benzodaizepines)   Do not take more than prescribed Pain, Sleep and Anxiety Medications. It is not advisable to combine anxiety,sleep and pain medications without talking with your primary care practitioner   Special Instructions: If you have smoked or chewed Tobacco  in the last 2 yrs please stop smoking, stop any regular Alcohol  and or any Recreational drug use.   Wear Seat belts while driving.   Please note: You were cared for by a hospitalist during your hospital stay. Once you are discharged, your primary care physician will handle any further medical issues. Please note that NO REFILLS for any discharge medications will be authorized once you are discharged, as it is imperative that you return to your primary care physician (or establish a relationship with a primary care physician if you do not have one) for your post hospital discharge needs so that they can reassess your need for medications and monitor your lab values.  Time coordinating discharge: 40 minutes  SIGNED:  Marzetta Board, MD, PhD 02/08/2022, 12:55 PM

## 2022-02-08 NOTE — Progress Notes (Signed)
PROGRESS NOTE    Alexandra Sanchez  PIR:[id number] DOB: 1965/06/22 DOA: 02/07/2022 PCP: [facility]   No chief complaint on file.   Brief Narrative:  Patient 57 year old female history of COPD, history of seizures, depression/anxiety initially admitted to inpatient behavioral health for suicidal ideation and MDD admitted from inpatient behavioral health for evaluation for seizure-like activity.   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 -Patient noted to have been off antiepileptics and most of her meds for at least a month per admitting physician, when patient questions stated had not been on her seizure medications in over 2 years.  States last seizure was approximately 4 weeks ago. -Patient noted to have been restarted on Keppra 500 mg twice daily on 02/06/2022 at behavioral health. -Per admitting physician neurology consulted and recommended admission to [facility] for continuous EEG. -Per neurology to hold off on antiepileptics at this time and Ativan as needed for seizures like activity > 38mins. -Continue seizure precautions, neurochecks.  2.  Dehydration -IV fluids.  3.  Suicidal ideation with history of depression and anxiety -Patient admitted at behavioral health prior to transfer to ED and started on Prozac and Zyprexa which are currently on hold due to potential for lowering seizure threshold. -Continue safety sitter and precautions. -Consult with psychiatry for further evaluation and recommendations.  4.  COPD -Stable. -Placed on Dulera and Incruse. -Albuterol as needed.   DVT prophylaxis: Lovenox Code  Status: Full Family Communication: No family at bedside. Disposition: TBD  Status is: Inpatient Remains inpatient appropriate because: Severity of illness   Consultants:  Neurology pending  Procedures:  CT head 02/07/2022   Antimicrobials:  Anti-infectives (From admission, onward)    None         Subjective: Patient laying on gurney in 4-point restraints.  States she needs to pee.  Asking for drink of water.  Denies any chest pain.  No shortness of breath.  No abdominal pain.  States has been taking seizure medications in over 2 years and her last seizure was approximately 4 weeks ago per patient.  Per RN no reported seizure-like activity overnight.  Objective: Vitals:   02/08/22 0700 02/08/22 0800 02/08/22 0900 02/08/22 0907  BP: 116/72 134/81 111/72 111/72  Pulse: 61 68 70 72  Resp: 15 16 15 16   Temp:      TempSrc:      SpO2: 96% 96% 95% 96%  Weight:      Height:       No intake or output data in the 24 hours ending 02/08/22 0942 Filed Weights   02/07/22 2222  Weight: 47.2 kg    Examination:  General exam: Appears calm and comfortable.  Dry mucous membranes. Respiratory system: Clear to auscultation.  No wheezes, no crackles, no rhonchi.  Fair air movement.  Speaking in full sentences.  Respiratory effort normal. Cardiovascular system: S1 & S2 heard, RRR. No JVD, murmurs, rubs, gallops or clicks. No pedal edema. Gastrointestinal system: Abdomen is nondistended, soft and nontender. No organomegaly or masses felt. Normal bowel sounds heard. Central nervous system: Alert and oriented. No focal neurological deficits. Extremities: Symmetric 5 x 5 power. Skin: No rashes, lesions or ulcers  Psychiatry: Judgement and insight appear fair. Mood & affect appropriate.     Data Reviewed: I have personally reviewed following labs and imaging studies  CBC: Recent Labs  Lab 02/03/22 1538 02/04/22 0515 02/07/22 1940 02/08/22 0635  WBC 9.5 11.0* 7.3 6.9  NEUTROABS 5.5   --   --   --   HGB 16.8* 14.4 15.5* 14.7  HCT 49.4* 43.1 45.3 43.7  MCV 85.2 87.1 84.4 84.4  PLT 325 240 284 PLATELET CLUMPS NOTED ON SMEAR, UNABLE TO ESTIMATE    Basic Metabolic Panel: Recent Labs  Lab 02/03/22 1538 02/04/22 0515 02/07/22 1940 02/08/22 0635  NA 136 137 137 136  K 3.3* 4.3 3.6 3.9  CL 99 109 102 105  CO2 26 20* 27 23  GLUCOSE 95 73 138* 89  BUN 8 10 14 10   CREATININE 0.82 0.70 0.93 0.59  CALCIUM 9.6 8.1* 9.2 8.7*  MG 1.8  --   --   --     GFR: Estimated Creatinine Clearance: 57.8 mL/min (by C-G formula based on SCr of 0.59 mg/dL).  Liver Function Tests: Recent Labs  Lab 02/03/22 1538  AST 21  ALT 15  ALKPHOS 142*  BILITOT 0.6  PROT 8.2*  ALBUMIN 4.6    CBG: Recent Labs  Lab 02/07/22 1818  GLUCAP 175*     Recent Results (from the past 240 hour(s))  Resp panel by RT-PCR (RSV, Flu A&B, Covid) Anterior Nasal Swab     Status: None   Collection Time: 02/03/22 11:54 PM   Specimen: Anterior Nasal Swab  Result Value Ref Range Status   SARS Coronavirus 2 by RT PCR NEGATIVE NEGATIVE Final    Comment: (NOTE) SARS-CoV-2 target nucleic acids are NOT DETECTED.  The SARS-CoV-2 RNA is generally detectable in upper respiratory specimens during the acute phase of infection. The lowest concentration of SARS-CoV-2 viral copies this assay can detect is 138 copies/mL. A negative result does not preclude SARS-Cov-2 infection and should not be used as the sole basis for treatment or other patient management decisions. A negative result may occur with  improper specimen collection/handling, submission of specimen other than nasopharyngeal swab, presence of viral mutation(s) within the areas targeted by this assay, and inadequate number of viral copies(<138 copies/mL). A negative result must be combined with clinical observations, patient history, and epidemiological information. The expected result is Negative.  Fact Sheet for Patients:   02/05/22  Fact Sheet for Healthcare Providers:  BloggerCourse.com  This test is no t yet approved or cleared by the [employer] FDA and  has been authorized for detection and/or diagnosis of SARS-CoV-2 by FDA under an Emergency Use Authorization (EUA). This EUA will remain  in effect (meaning this test can be used) for the duration of the COVID-19 declaration under Section 564(b)(1) of the Act, 21 U.S.C.section 360bbb-3(b)(1), unless the authorization is terminated  or revoked sooner.       Influenza A by PCR NEGATIVE NEGATIVE Final   Influenza B by PCR NEGATIVE NEGATIVE Final    Comment: (NOTE) The Xpert Xpress SARS-CoV-2/FLU/RSV plus assay is intended as an aid in the diagnosis of influenza from Nasopharyngeal swab specimens and should not be used as a sole basis for treatment. Nasal washings and aspirates are unacceptable for Xpert Xpress SARS-CoV-2/FLU/RSV testing.  Fact Sheet for Patients: [place]  Fact Sheet for Healthcare Providers: BloggerCourse.com  This test is not yet approved or cleared by the SeriousBroker.[place] FDA and has been authorized for detection and/or diagnosis of SARS-CoV-2  by FDA under an Emergency Use Authorization (EUA). This EUA will remain in effect (meaning this test can be used) for the duration of the COVID-19 declaration under Section 564(b)(1) of the Act, 21 U.S.C. section 360bbb-3(b)(1), unless the authorization is terminated or revoked.     Resp Syncytial Virus by PCR NEGATIVE NEGATIVE Final    Comment: (NOTE) Fact Sheet for Patients: EntrepreneurPulse.com.au  Fact Sheet for Healthcare Providers: IncredibleEmployment.be  This test is not yet approved or cleared by the [place] FDA and has been authorized for detection and/or diagnosis of SARS-CoV-2 by FDA under an Emergency Use  Authorization (EUA). This EUA will remain in effect (meaning this test can be used) for the duration of the COVID-19 declaration under Section 564(b)(1) of the Act, 21 U.S.C. section 360bbb-3(b)(1), unless the authorization is terminated or revoked.  Performed at [facility], [address], [place], [place] [address]   C Difficile Quick Screen w PCR reflex     Status: None   Collection Time: 02/04/22  4:12 AM   Specimen: STOOL  Result Value Ref Range Status   C Diff antigen NEGATIVE NEGATIVE Final   C Diff toxin NEGATIVE NEGATIVE Final   C Diff interpretation No C. difficile detected.  Final    Comment: Performed at [facility], [address], [place], [place] [address]  Gastrointestinal Panel by PCR , Stool     Status: None   Collection Time: 02/04/22  4:12 AM   Specimen: STOOL  Result Value Ref Range Status   Campylobacter species NOT DETECTED NOT DETECTED Final   Plesimonas shigelloides NOT DETECTED NOT DETECTED Final   Salmonella species NOT DETECTED NOT DETECTED Final   Yersinia enterocolitica NOT DETECTED NOT DETECTED Final   Vibrio species NOT DETECTED NOT DETECTED Final   Vibrio cholerae NOT DETECTED NOT DETECTED Final   Enteroaggregative E coli (EAEC) NOT DETECTED NOT DETECTED Final   Enteropathogenic E coli (EPEC) NOT DETECTED NOT DETECTED Final   Enterotoxigenic E coli (ETEC) NOT DETECTED NOT DETECTED Final   Shiga like toxin producing E coli (STEC) NOT DETECTED NOT DETECTED Final   Shigella/Enteroinvasive E coli (EIEC) NOT DETECTED NOT DETECTED Final   Cryptosporidium NOT DETECTED NOT DETECTED Final   Cyclospora cayetanensis NOT DETECTED NOT DETECTED Final   Entamoeba histolytica NOT DETECTED NOT DETECTED Final   Giardia lamblia NOT DETECTED NOT DETECTED Final   Adenovirus F40/41 NOT DETECTED NOT DETECTED Final   Astrovirus NOT DETECTED NOT DETECTED Final   Norovirus GI/GII NOT DETECTED NOT DETECTED Final   Rotavirus A NOT DETECTED NOT DETECTED Final   Sapovirus  (I, II, IV, and V) NOT DETECTED NOT DETECTED Final    Comment: Performed at [facility], [address], [place], [place] [address]         Radiology Studies: CT HEAD WO CONTRAST (5MM)  Result Date: 02/07/2022 CLINICAL DATA:  Seizure disorder EXAM: CT HEAD WITHOUT CONTRAST TECHNIQUE: Contiguous axial images were obtained from the base of the skull through the vertex without intravenous contrast. RADIATION DOSE REDUCTION: This exam was performed according to the departmental dose-optimization program which includes automated exposure control, adjustment of the mA and/or kV according to patient size and/or use of iterative reconstruction technique. COMPARISON:  06/01/2021 FINDINGS: Brain: No evidence of acute infarction, hemorrhage, hydrocephalus, extra-axial collection or mass lesion/mass effect. Mild subcortical white matter and periventricular small vessel ischemic changes. Vascular: No hyperdense vessel or unexpected calcification. Skull: Normal. Negative for fracture or focal lesion. Sinuses/Orbits: The visualized paranasal  sinuses are essentially clear. The mastoid air cells are unopacified. Other: None. IMPRESSION: No evidence of acute intracranial abnormality. Mild small vessel ischemic changes. Electronically Signed   By: Julian Hy M.D.   On: 02/07/2022 20:06        Scheduled Meds:  enoxaparin (LOVENOX) injection  40 mg Subcutaneous Q24H   sodium chloride flush  3 mL Intravenous Q12H   Continuous Infusions:  sodium chloride 125 mL/hr at 02/08/22 0817     LOS: 1 day    Time spent: 35 minutes    Irine Seal, MD [facility]   To contact the attending provider between 7A-7P or the covering provider during after hours 7P-7A, please log into the web site [email or web address] and access using universal [place] password for that web site. If you do not have the password, please call the hospital operator.  02/08/2022, 9:42 AM
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
## 2022-02-08 NOTE — ED Notes (Signed)
Pt given another ginger ale, this RN to assist pt d/t bilateral restraints

## 2022-02-08 NOTE — Progress Notes (Signed)
Patient brought to 4NP09 on restraints from [place]. After patient was settled in room she started to pull and remove restraints, Peripheral IV and cardiac monitoring. Refused to put them back on or follow commands. Patient stated she wanted to leave AMA and did not need any help or do an EEG. This nurse provided patient with education of what needed to be done while she was an inpatient and also AMA education after the patient refused to comply. Patient agreed to the [facility] education and understood what leaving AMA meant. Hospitalist made aware and allowed the patient to leave AMA. This nurse helped the patient get her belongings together and directed her out the front door.  Shelbie Proctor, RN

## 2022-02-08 NOTE — ED Notes (Signed)
Called lab, advised sent down 2 yellow tops on pt with IV start, asked to please run HIV panel, as the order is in computer. Lab stated they will run HIV panel once order is placed, advised the order is in pt's chart, this RN has collected the tubes and collected it in the EMR as well. Lab stated they will verify order and run specimens.

## 2022-02-09 NOTE — BHH Suicide Risk Assessment (Signed)
[facility] Discharge Suicide Risk Assessment   Principal Problem: Anorexia nervosa Discharge Diagnoses: Principal Problem:   Anorexia nervosa Active Problems:   MDD (major depressive disorder)   Seizures (HCC)   Anxiety state   Tobacco use disorder   Total Time spent with patient: {Time; 15 min - 8 hours:17441}  Musculoskeletal: Strength & Muscle Tone: {desc; muscle tone:32375} Gait & Station: {PE GAIT ED UJWJ:19147} Patient leans: {Patient Leans:21022755}  Psychiatric Specialty Exam  Presentation  General Appearance:  Disheveled  Eye Contact: Poor  Speech: Normal Rate  Speech Volume: Increased  Handedness: Right   Mood and Affect  Mood: Depressed; Irritable  Duration of Depression Symptoms: Greater than two weeks  Affect: Labile   Thought Process  Thought Processes: Linear  Descriptions of Associations:Circumstantial  Orientation:Full (Time, Place and Person)  Thought Content:Illogical  History of Schizophrenia/Schizoaffective disorder:No  Duration of Psychotic Symptoms:No data recorded Hallucinations:No data recorded Ideas of Reference:None  Suicidal Thoughts:No data recorded Homicidal Thoughts:No data recorded  Sensorium  Memory: Immediate Good; Recent Good; Remote Good  Judgment: Impaired  Insight: Lacking   Executive Functions  Concentration: Poor  Attention Span: Poor  Recall: Good  Fund of Knowledge: Good  Language: Good   Psychomotor Activity  Psychomotor Activity:No data recorded  Assets  Assets: Desire for Improvement   Sleep  Sleep:No data recorded  Physical Exam: Physical Exam ROS Blood pressure 137/84, pulse 81, temperature 98.4 F (36.9 C), temperature source Oral, resp. rate 13, height 5\' 2"  (1.575 m), weight 47.2 kg, SpO2 97 %. Body mass index is 19.02 kg/m.  Mental Status Per Nursing Assessment::   On Admission:     Demographic Factors:  {Demographic Factors:20662}  Loss Factors: {Loss  Factors:20659}  Historical Factors: {Historical Factors:20660}  Risk Reduction Factors:   {Risk Reduction Factors:20661}  Continued Clinical Symptoms:  {Clinical Factors:22706}  Cognitive Features That Contribute To Risk:  {chl bhh Cognitive Features:304700251}    Suicide Risk:  {BHH SUICIDE RISK:22704}   Follow-up Information     Services, [facility]. Go on 02/12/2022.   Why: You have a hospital follow up appointment for therapy and medication management services on 02/12/22 at 8:40 am.  This appointment will be held in person.  * Temporary location:  [address] [place], [place]. Contact information: [place] [address] [phone]                 Plan Of Care/Follow-up recommendations:  {BHH DC FU RECOMMENDATIONS:22620}  Christoper Allegra, MD 02/09/2022, 6:56 AM
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
## 2022-02-26 ENCOUNTER — Ambulatory Visit: Payer: Medicaid Other | Admitting: Diagnostic Neuroimaging

## 2022-03-14 ENCOUNTER — Ambulatory Visit (HOSPITAL_COMMUNITY)
Admission: EM | Admit: 2022-03-14 | Discharge: 2022-03-14 | Disposition: A | Discharge status: Home / Self Care | Payer: Medicaid Other | Attending: Family | Admitting: Family

## 2022-03-14 ENCOUNTER — Encounter: Payer: Medicaid Other | Attending: Family Medicine | Admitting: Registered"

## 2022-03-14 DIAGNOSIS — G479 Sleep disorder, unspecified: Secondary | ICD-10-CM | POA: Insufficient documentation

## 2022-03-14 DIAGNOSIS — Z713 Dietary counseling and surveillance: Secondary | ICD-10-CM | POA: Diagnosis present

## 2022-03-14 DIAGNOSIS — R45851 Suicidal ideations: Secondary | ICD-10-CM | POA: Insufficient documentation

## 2022-03-14 DIAGNOSIS — F339 Major depressive disorder, recurrent, unspecified: Secondary | ICD-10-CM | POA: Insufficient documentation

## 2022-03-14 NOTE — Progress Notes (Signed)
   03/14/22 1310  [place] (Walk-ins at [facility] only)  What Is the Reason for Your Visit/Call Today? Pt is a 57 yo female who was transported to [facility] by [employer] from her dietician's office. Pt denied SI, HI, any attempts, NSSH, AVH and any substance use. Pt stated that she was in the psychiatric hospital about a month ago "for the same stuff." Pt stated that she has an eating disorder and has had it for a long time. Pt quickly added "but I am not suicidal." Pt stated she has been diagnosed with depression in the past but does not have any OP psychiatric providers and is not prescribed any medications for depression. Pt reported poor sleep due to her daughters and grandchildren interrupting her sleep. Pt stated she has not had anything to eat in 3 days by her choice.  How Long Has This Been Causing You Problems? > than 6 months  Have You Recently Had Any Thoughts About Hurting Yourself? No  Are You Planning to Commit Suicide/Harm Yourself At This time? No  Have you Recently Had Thoughts About [place]? No  Are You Planning To Harm Someone At This Time? No  Are you currently experiencing any auditory, visual or other hallucinations? No  Have You Used Any Alcohol or Drugs in the Past 24 Hours? No  Do you have any current medical co-morbidities that require immediate attention? No  Clinician description of patient physical appearance/behavior: Pt was irritated, uncooperative, alert and appeared fully oriented. Pt did not appear to be responding to internal stimuli, experiencing delusional thinking or to be intoxicated. Pt's speech and movement appeared within normal limits and pt's appearance was unremarkable although she did appear very thin. Pt's mood seemed disinterested and irritable and pt had a flat affect which was congruent. Pt's judgment and insight seemed poor.  What Do You Feel Would Help You the Most Today? Treatment for Depression or other mood problem  If access to [facility] was not available, would you have sought care in the Emergency Department? Yes  Determination of Need Routine (7 days)  Options For Referral Medication Management;Outpatient Therapy   Demetrius Barrell T. Mare Ferrari, [place], [facility], [facility] Triage Specialist [facility]

## 2022-03-14 NOTE — Discharge Instructions (Signed)
Patient is instructed prior to discharge to:  Take all medications as prescribed by his/her mental healthcare provider. Report any adverse effects and or reactions from the medicines to his/her outpatient provider promptly. Keep all scheduled appointments, to ensure that you are getting refills on time and to avoid any interruption in your medication.  If you are unable to keep an appointment call to reschedule.  Be sure to follow-up with resources and follow-up appointments provided.  Patient has been instructed & cautioned: To not engage in alcohol and or illegal drug use while on prescription medicines. In the event of worsening symptoms, patient is instructed to call the crisis hotline, 911 and or go to the nearest ED for appropriate evaluation and treatment of symptoms. To follow-up with his/her primary care provider for your other medical issues, concerns and or health care needs.  Information: -National Suicide Prevention Lifeline 1-800-SUICIDE or (320)520-7775.  -988 offers 24/7 access to trained crisis counselors who can help people experiencing mental health-related distress. People can call or text 988 or chat 988lifeline.org for themselves or if they are worried about a loved one who may need crisis support.     Outpatient Services for Therapy and Medication Management for [facility] [place], [place], [address] [phone] phone  New Patient Assessment/Therapy Walk-ins Monday and Wednesday: 8am until slots are full. Every 1st and 2nd Friday: 1pm - 5pm  NO ASSESSMENT/THERAPY WALK-INS ON [place]  New Patient Psychiatry/Medication Management Walk-ins Monday-Friday: 8am-11am  For all walk-ins, we ask that you arrive by 7:30am because patient will be seen in the order of arrival.  Availability is limited; therefore, you may not be seen on the same day that you walk-in.  Our goal is to serve and meet the needs of our  community to the best of our ability.   [facility] [address], [place], [place], [address] [phone] phone  [address] [place], [place], [address] [phone] phone ([address] [place], [place], [place], [place], [employer], [place], [place], [place], [place], [place], [place], [employer], [employer], [employer], [place])  [facility] [address] [place], [place], [address] [phone] phone  [place] [address], [place], [place], [address] [phone] phone  [facility] [address] [place], [place], [address] [phone] phone  [facility] [address] [place], [address] [phone] phone  [facility], [place] [address], [place], [address] [phone] phone  [facility] [place] [place], [place], [address] [phone] phone [phone] fax  [facility] [address] [place], [place], [address] [phone] phone [phone] fax  [facility] [phone] [address] [place], [place], [address] [phone] phone  [facility] [address] [place], [place], [address]  [phone] phone  [facility]  (Adults Only) [address] [place], [place], [address]  [phone] phone [phone] fax

## 2022-03-14 NOTE — ED Provider Notes (Signed)
Behavioral Health Urgent Care Medical Screening Exam  Patient Name: Alexandra Sanchez MRN: [id number] Date of Evaluation: 03/14/22 Chief Complaint:   Diagnosis:  Final diagnoses:  Episode of recurrent major depressive disorder, unspecified depression episode severity ([place])  Passive suicidal ideations    History of Present illness: Alexandra Sanchez is a 57 y.o. female. Patient presents voluntarily to [facility] behavioral health for walk-in assessment.  Patient transported voluntarily by law enforcement from outpatient dietitian scheduled appointment earlier this date.  Patient states "I said that I think about dying and that I wish I was dead sometimes but I never said that I wanted to kill myself, I would never do that."  Patient reports intermittent passive suicidal ideations times more than 5 years.   Patient is assessed, face-to-face, by nurse practitioner. She is seated in assessment area, no acute distress. Consulted with provider, Dr.  Dwyane Dee, and chart reviewed on 03/14/2022. She  is alert and oriented, cooperative during assessment. Patient  presents with euthymic mood, irritable affect.   Recent stressors include a difficult and challenging relationship with her 3 daughters ages 43 years old, 40 years old and 58 years old.  Patient reports difficulty sleeping related to "people coming and going all through the day and night and my granddaughter in the house."  Patient's 3 daughters reside with her, they are sometimes disrespectful.  She states "my kids tell me they hate me."  She reports financial concerns as she "is stressed out, I am paying all the bills."  She  denies suicidal and homicidal ideations currently. She denies history of suicide attempts, denies history of nonsuicidal self-harm behavior.  Patient easily contracts verbally for safety with this Probation officer. She denies auditory and visual hallucinations.  Patient is able to converse coherently with goal-directed thoughts and no  distractibility or preoccupation.  Denies symptoms of paranoia.  Objectively there is no evidence of psychosis/mania or delusional thinking.  [name] endorses history of major depressive disorder.  She would like to be linked with outpatient psychiatry for medication management and individual counseling.  She would also like to be linked with specifically "an eating disorder counselor and family therapy, my whole family needs to come to counseling with me."  No current medications to address mood.  She was admitted to [facility] behavioral health several weeks ago.  She states "I was admitted last month but while I was there I cheeked my meds, I was not very cooperative because I did not want to be there, I do not think forcing someone into the hospital is helpful." Family mental health history includes mother diagnosed with MDD and alcohol use disorder, also father diagnosed with MDD and alcohol use disorder.   Patient verbalizes plan to follow-up with [facility].  She canceled her appointment after discharge from [facility] behavioral health related to transportation difficulties.  Patient endorses history of multiple previous inpatient psychiatric hospitalizations.  Hospitalized in [place] approximately 25 years ago related to eating disorder.  She reports losing more than 100 pounds more than 20 years ago.  Since that time she reports chronically decreased appetite.  Patient states "I was fat most of my life, I do not want to be fat."  Patient consumed a ginger ale and 2 packages of snack crackers while waiting.  Alexandra Sanchez resides in [place] with 3 daughters and 1 grandchild.  She denies access to weapons.  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  She denies alcohol  and substance use.  Patient offered support and encouragement.  She gives verbal consent to speak with a close friend, Eduard Clos phone number [phone].  Attempted to reach Charlie x 3,  HIPAA compliant voicemail left.  Patient gives verbal consent to speak with her adult daughter, Jewelie Boyster, phone number [phone].  Patient's daughter confirms no weapons in home.  Patient's daughter denies safety concerns.  She states "I never have safety concerns for her, she has always had passive suicidal ideations, my whole life, I think she wants attention and wants to be heard."  Per patient's daughter patient has never intentionally harmed herself.  Patient's daughter verbalizes understanding of strict return precautions.   Patient and family are educated and verbalize understanding of mental health resources and other crisis services in the community. They are instructed to call 911 and present to the nearest emergency room should patient experience any suicidal/homicidal ideation, auditory/visual/hallucinations, or detrimental worsening of mental health condition.      [place] [facility] from 03/14/2022 in [facility] Most recent reading at 03/14/2022  1:26 PM Nutrition from 03/14/2022 in [facility] Most recent reading at 03/14/2022 11:03 AM ED to Hosp-Admission (Discharged) from 02/07/2022 in [place] Most recent reading at 02/07/2022 10:23 PM  C-SSRS RISK CATEGORY No Risk Error: Question 6 not populated No Risk       Psychiatric Specialty Exam  Presentation  General Appearance:Appropriate for Environment; Casual  Eye Contact:Good  Speech:Clear and Coherent; Normal Rate  Speech Volume:Normal  Handedness:Right   Mood and Affect  Mood: Irritable  Affect: Congruent   Thought Process  Thought Processes: Coherent; Goal Directed; Linear  Descriptions of Associations:Intact  Orientation:Full (Time, Place and Person)  Thought Content:Logical; WDL  Diagnosis of Schizophrenia or Schizoaffective disorder in past: No   Hallucinations:None  Ideas of  Reference:None  Suicidal Thoughts:No Without Intent  Homicidal Thoughts:No   Sensorium  Memory: Immediate Good; Recent Fair  Judgment: Fair  Insight: Present   Executive Functions  Concentration: Fair  Attention Span: Fair  Recall: [place] of Knowledge: Good  Language: Good   Psychomotor Activity  Psychomotor Activity: Normal   Assets  Assets: Communication Skills; Desire for Improvement; Resilience; Social Support; Housing; Financial Resources/Insurance   Sleep  Sleep: Poor  Number of hours: No data recorded  Physical Exam: Physical Exam Vitals and nursing note reviewed.  Constitutional:      Appearance: Normal appearance. She is well-developed.  HENT:     Head: Normocephalic and atraumatic.     Nose: Nose normal.  Cardiovascular:     Rate and Rhythm: Normal rate.  Pulmonary:     Effort: Pulmonary effort is normal.  Musculoskeletal:        General: Normal range of motion.     Cervical back: Normal range of motion.  Skin:    General: Skin is warm and dry.  Neurological:     Mental Status: She is alert and oriented to person, place, and time.  Psychiatric:        Attention and Perception: Attention and perception normal.        Mood and Affect: Mood normal. Affect is labile.        Speech: Speech normal.        Behavior: Behavior normal.        Thought Content: Thought content normal.        Cognition and Memory: Cognition normal.   Review  of Systems  Constitutional: Negative.   HENT: Negative.    Eyes: Negative.   Respiratory: Negative.    Cardiovascular: Negative.   Gastrointestinal: Negative.   Genitourinary: Negative.   Musculoskeletal: Negative.   Skin: Negative.   Neurological: Negative.   Psychiatric/Behavioral: Negative.     Blood pressure (!) 157/79, pulse 60, temperature 98.7 F (37.1 C), temperature source Oral, resp. rate 20, SpO2 100 %. There is no height or weight on file to calculate  BMI.  Musculoskeletal: Strength & Muscle Tone: within normal limits Gait & Station: normal Patient leans: N/A   [place] MSE Discharge Disposition for Follow up and Recommendations: Based on my evaluation the patient does not appear to have an emergency medical condition and can be discharged with resources and follow up care in outpatient services for Medication Management and Individual Therapy Follow-up with outpatient psychiatry, resources provided.  Continue current medications.  Lucky Rathke, FNP 03/14/2022, 2:20 PM

## 2022-03-14 NOTE — Progress Notes (Signed)
Appointment start time: 11:00  Appointment end time: 11:20  Patient was seen on 03/14/2022 for nutrition counseling pertaining to disordered eating  Primary care provider: Lanelle Bal Therapist: none  ROI: N/A Any other medical team members:    Assessment  Pt arrives stating she can't sleep. States she quit smoking cigarettes and smoking weed. Reports she gained weight to be over 105 lbs and started back smoking cigarettes again because it curbs her appetite. States she used to weigh 289 lbs and weighed 121 lbs last year.   States she wants to get a good handle on eating. Reports she does not eat a lot of sweets  States she is supposed to be taking medication for high cholesterol and doesn't remember to take them. States she gets about 3 hours of sleep per night.   States she has 6 people living in her home; does not have privacy.   States she feels her family would be better off if she was no longer alive.    Growth Metrics: Goal rate of weight gain:  0.5-1.0 lb/week  Eating history: Length of time:  Previous treatments:  Goals for RD meetings:   Weight history:  Highest weight:    Lowest weight:  Most consistent weight:   What would you like to weigh: How has weight changed in the past year:   Medical Information:  Changes in hair, skin, nails since ED started:  Chewing/swallowing difficulties:  Reflux or heartburn:  Trouble with teeth:  LMP without the use of hormones:   Weight at that point:  Effect of exercise on menses:    Effect of hormones on menses:  Constipation, diarrhea:  Dizziness/lightheadedness:  Headaches/body aches:  Heart racing/chest pain:  Mood:  Sleep:  Focus/concentration:  Cold intolerance:  Vision changes:   Mental health diagnosis:    Dietary assessment: A typical day consists of meals and  snacks  Safe foods include:  Avoided foods include:  24 hour recall:    What Methods Do You Use To Control Your Weight (Compensatory  behaviors)?           Restricting (calories, fat, carbs)  SIV  Diet pills  Laxatives  Diuretics  Alcohol or drugs  Exercise (what type)  Food rules or rituals (explain)  Binge  Estimated energy intake:  kcal  Estimated energy needs: 1600-1800 kcal 200-250 g CHO 80-90 g pro 53-60 g fat  Nutrition Diagnosis: NB-1.5 Disordered eating pattern As related to lack of interest in food sue to depression.  As evidenced by failure to maintain appropriate weight.  Intervention/Goals: Pt reports recent SI during this appt and per our department policy, I informed pt that I would be calling 911 and she agreed. Pt went to the restroom and stated she considered not returning but decided to come back and wait for emergency services. Pt waited in the lobby while I was in the front office. Emergency services arrived around 12 pm, spoke with pt and pt left with them to go to [facility].  Meal plan:     meals     snacks  Monitoring and Evaluation: Patient will follow up prn.

## 2022-03-29 ENCOUNTER — Ambulatory Visit: Payer: Medicaid Other | Admitting: Diagnostic Neuroimaging

## 2022-08-27 IMAGING — DX DG CHEST 1V PORT
1 series · 1 of 1 positions shown · non-contrast
Comparison: January 04, 2020

CLINICAL DATA: Pain

EXAM:
PORTABLE CHEST 1 VIEW

[chest ap]
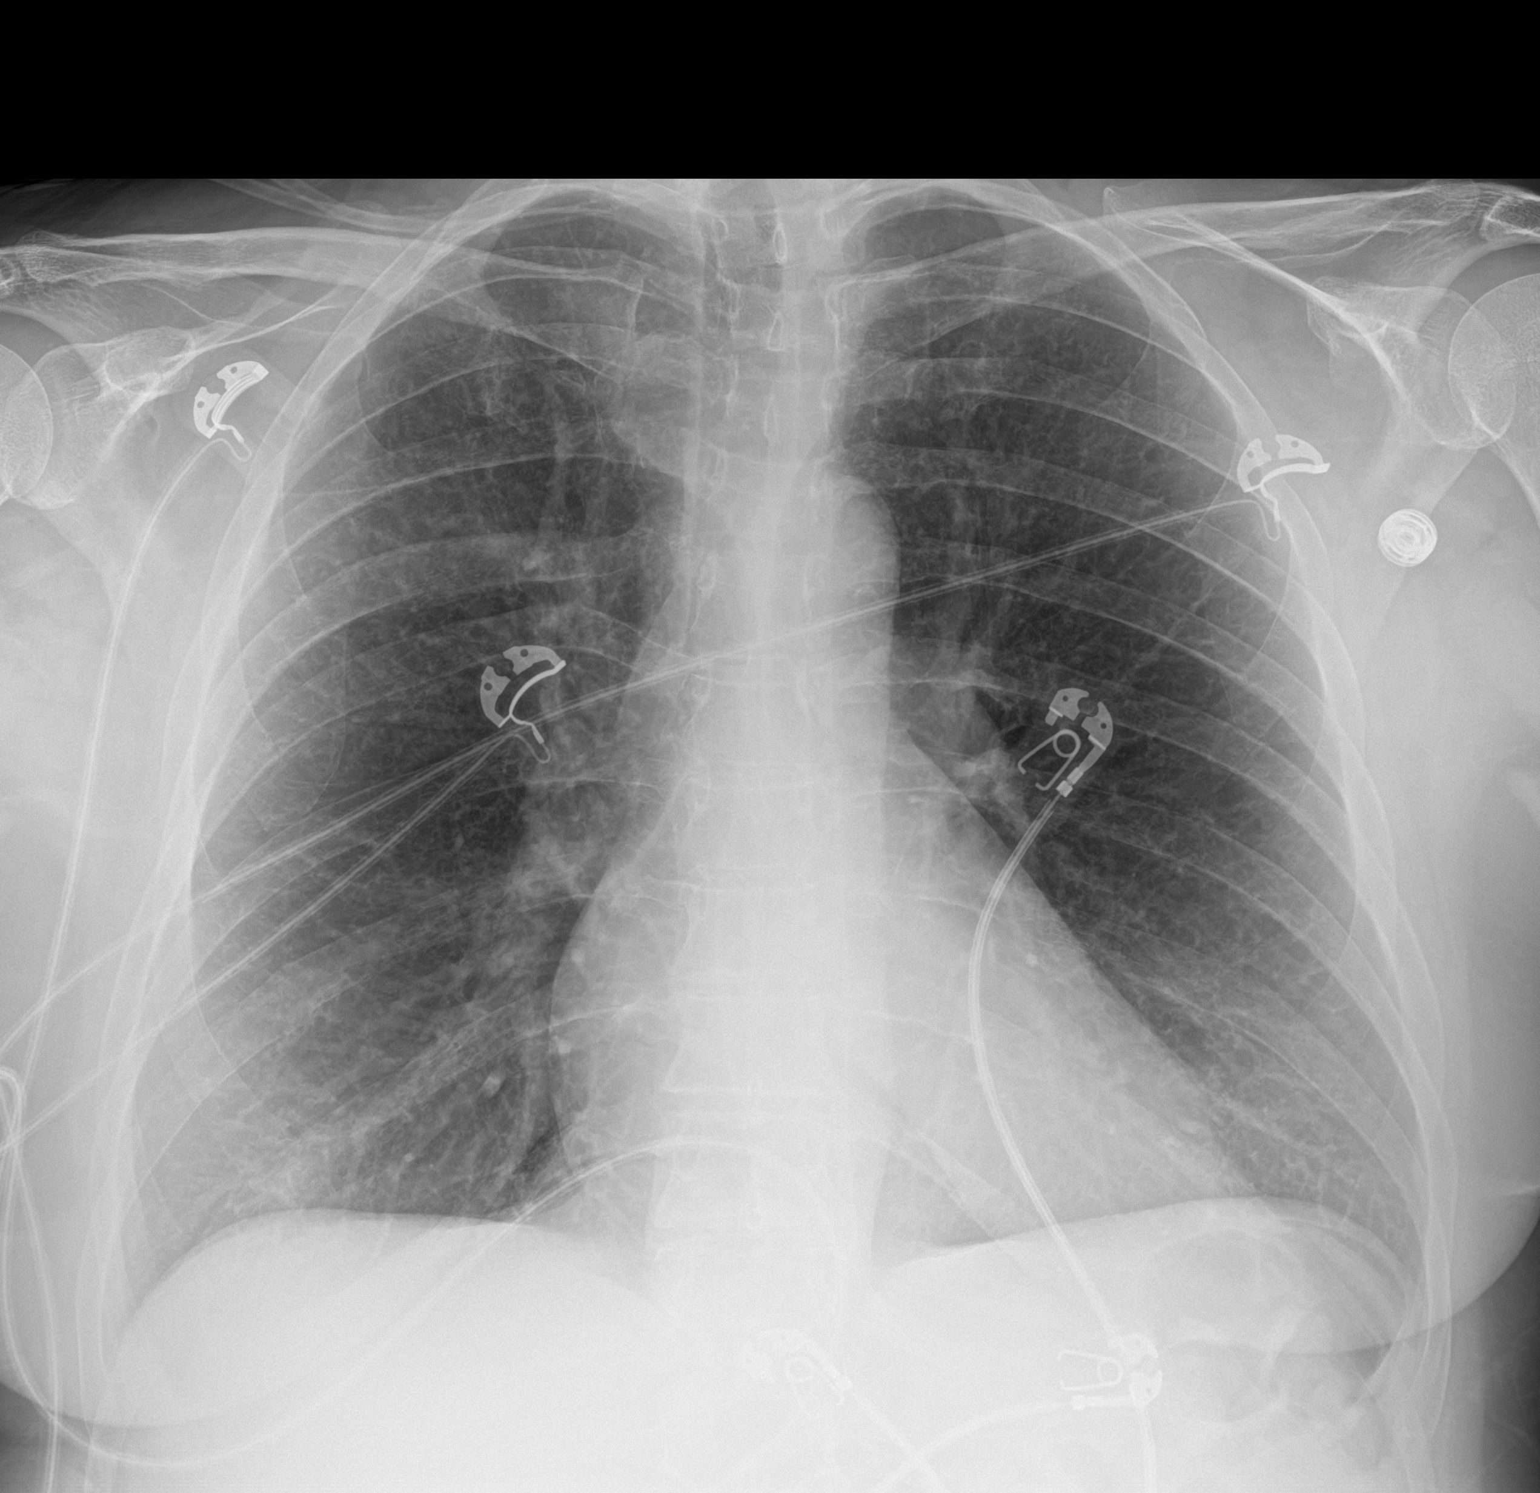

[1 of 1 positions shown; findings below may reference images not displayed]

FINDINGS: There is a small area of ill-defined opacity in the right base.
Lungs elsewhere are clear. Heart size and pulmonary vascularity are
normal. No adenopathy. There is postoperative change in the lower
cervical region.
IMPRESSION: Rather subtle area of opacity right base concerning for early
developing pneumonia. Lungs elsewhere clear. Heart size within
normal limits.

## 2022-10-09 ENCOUNTER — Encounter (HOSPITAL_COMMUNITY): Payer: Self-pay | Admitting: Emergency Medicine

## 2022-10-09 ENCOUNTER — Other Ambulatory Visit: Payer: Self-pay

## 2022-10-09 ENCOUNTER — Emergency Department (HOSPITAL_COMMUNITY): Payer: MEDICAID

## 2022-10-09 ENCOUNTER — Emergency Department (HOSPITAL_COMMUNITY)
Admission: EM | Admit: 2022-10-09 | Discharge: 2022-10-10 | Disposition: A | Discharge status: Home / Self Care | Payer: MEDICAID | Attending: Emergency Medicine | Admitting: Emergency Medicine

## 2022-10-09 DIAGNOSIS — R079 Chest pain, unspecified: Secondary | ICD-10-CM | POA: Diagnosis present

## 2022-10-09 DIAGNOSIS — Z91040 Latex allergy status: Secondary | ICD-10-CM | POA: Diagnosis not present

## 2022-10-09 DIAGNOSIS — J441 Chronic obstructive pulmonary disease with (acute) exacerbation: Secondary | ICD-10-CM | POA: Insufficient documentation

## 2022-10-09 DIAGNOSIS — H6690 Otitis media, unspecified, unspecified ear: Secondary | ICD-10-CM

## 2022-10-09 DIAGNOSIS — H6691 Otitis media, unspecified, right ear: Secondary | ICD-10-CM | POA: Diagnosis not present

## 2022-10-09 LAB — URINALYSIS, ROUTINE W REFLEX MICROSCOPIC
Bacteria, UA: NONE SEEN
Bilirubin Urine: NEGATIVE
Glucose, UA: NEGATIVE mg/dL
Ketones, ur: NEGATIVE mg/dL
Leukocytes,Ua: NEGATIVE
Nitrite: NEGATIVE
Protein, ur: NEGATIVE mg/dL
Specific Gravity, Urine: 1.002 — ABNORMAL LOW (ref 1.005–1.030)
pH: 7 (ref 5.0–8.0)

## 2022-10-09 LAB — BASIC METABOLIC PANEL
Anion gap: 9 (ref 5–15)
BUN: 5 mg/dL — ABNORMAL LOW (ref 6–20)
CO2: 28 mmol/L (ref 22–32)
Calcium: 9.5 mg/dL (ref 8.9–10.3)
Chloride: 104 mmol/L (ref 98–111)
Creatinine, Ser: 0.69 mg/dL (ref 0.44–1.00)
GFR, Estimated: >60 mL/min (ref >60)
Glucose, Bld: 85 mg/dL (ref 70–99)
Potassium: 3.6 mmol/L (ref 3.5–5.1)
Sodium: 141 mmol/L (ref 135–145)

## 2022-10-09 LAB — CBC
HCT: 43.1 % (ref 36.0–46.0)
Hemoglobin: 14.5 g/dL (ref 12.0–15.0)
MCH: 29.2 pg (ref 26.0–34.0)
MCHC: 33.6 g/dL (ref 30.0–36.0)
MCV: 86.7 fL (ref 80.0–100.0)
Platelets: 367 10*3/uL (ref 150–400)
RBC: 4.97 MIL/uL (ref 3.87–5.11)
RDW: 13.3 % (ref 11.5–15.5)
WBC: 9.6 10*3/uL (ref 4.0–10.5)
nRBC: 0 % (ref 0.0–0.2)

## 2022-10-09 LAB — TROPONIN I (HIGH SENSITIVITY): Troponin I (High Sensitivity): 4 ng/L (ref <18)

## 2022-10-09 NOTE — ED Triage Notes (Signed)
Pt presents with left-sided CP, radiating down left arm, hypertensive in triage, also has bilateral ear pain.

## 2022-10-10 LAB — TROPONIN I (HIGH SENSITIVITY): Troponin I (High Sensitivity): 5 ng/L (ref <18)

## 2022-10-10 MED ORDER — CEFDINIR 300 MG PO CAPS: 300.0000 mg | ORAL_CAPSULE | Freq: Two times a day (BID) | ORAL | 0 refills | Status: DC

## 2022-10-10 MED ORDER — ONDANSETRON HCL 4 MG/2ML IJ SOLN
4.0000 mg | Freq: Once | INTRAMUSCULAR | Status: AC
  Administered 2022-10-10: 4 mg via INTRAVENOUS
  Filled 2022-10-10: qty 2

## 2022-10-10 MED ORDER — MORPHINE SULFATE (PF) 4 MG/ML IV SOLN
4.0000 mg | Freq: Once | INTRAVENOUS | Status: AC
  Administered 2022-10-10: 4 mg via INTRAVENOUS
  Filled 2022-10-10: qty 1

## 2022-10-10 MED ORDER — PREDNISONE 20 MG PO TABS: 40.0000 mg | ORAL_TABLET | Freq: Every day | ORAL | 0 refills | Status: DC

## 2022-10-10 NOTE — ED Notes (Signed)
Patient reports chest pressure, nonproductive cough, and R ear pain

## 2022-10-10 NOTE — ED Provider Notes (Signed)
[place] EMERGENCY DEPARTMENT AT [facility] Provider Note   CSN: [id number] Arrival date & time: 10/09/22  2143     History {Add pertinent medical, surgical, social history, OB history to HPI:1} Chief Complaint  Patient presents with   Chest Pain    Alexandra Sanchez is a 57 y.o. female.  Patient presents to the emergency department for evaluation of chest pain.  Patient reports pain in the central portion of her chest that radiates to the left arm.  Patient with cough and does have a history of COPD.  Patient also complaining of right ear pain.  She was treated for an ear infection recently but pain has not resolved.       Home Medications Prior to Admission medications   Medication Sig Start Date End Date Taking? Authorizing Provider  ALPRAZolam (XANAX XR) 0.5 MG 24 hr tablet Take 0.5 mg by mouth daily.    [provider]  atorvastatin (LIPITOR) 40 MG tablet Take 40 mg by mouth daily.    [provider]  betamethasone valerate ointment (VALISONE) 0.1 % Apply topically 2 (two) times daily. Patient not taking: Reported on 02/03/2022 01/11/22   [provider]  fluticasone (FLONASE) 50 MCG/ACT nasal spray Place 2 sprays into both nostrils daily.    [provider]  gabapentin (NEURONTIN) 300 MG capsule Take 300 mg by mouth at bedtime. Patient not taking: Reported on 02/03/2022    [provider]  hydrochlorothiazide (HYDRODIURIL) 25 MG tablet Take 25 mg by mouth daily.    [provider]  ibuprofen (ADVIL) 200 MG tablet Take 800 mg by mouth every 6 (six) hours as needed for fever, headache or mild pain.    [provider]  levETIRAcetam (KEPPRA) 500 MG tablet Take 1 tablet (500 mg total) by mouth 2 (two) times daily. Patient not taking: Reported on 10/16/2021 03/20/20   Sabino Dick, DO  lidocaine (LIDODERM) 5 % APPLY 1 PATCH BY TRANSDERMAL ROUTE ONCE DAILY (MAY WEAR UP TO 12HOURS.) Patient not taking:  Reported on 02/03/2022    [provider]  omeprazole (PRILOSEC) 40 MG capsule Take 40 mg by mouth daily. 02/13/22   [provider]  PROAIR HFA 108 (90 Base) MCG/ACT inhaler 2 puffs every 4 (four) hours as needed. 06/10/20   [provider]  propranolol (INDERAL) 40 MG tablet Take 40 mg by mouth 2 (two) times daily. Patient not taking: Reported on 02/03/2022    [provider]  QUEtiapine (SEROQUEL) 25 MG tablet Take 1 tablet every day by oral route at bedtime for 30 days. Patient not taking: Reported on 02/03/2022 03/29/17   [provider]  sertraline (ZOLOFT) 50 MG tablet Take 50 mg by mouth daily.    [provider]  SYMBICORT 160-4.5 MCG/ACT inhaler SMARTSIG:2 Puff(s) By Mouth Twice Daily Patient not taking: Reported on 02/03/2022 11/08/21   [provider]  triamcinolone cream (KENALOG) 0.1 % APPLY THIN COAT TO AFFECTED AREA TWICE A DAY Patient not taking: Reported on 02/03/2022    [provider]  venlafaxine XR (EFFEXOR-XR) 75 MG 24 hr capsule TAKE 1 CAPSULE BY MOUTH EVERY DAY IN THE MORNING Patient not taking: Reported on 02/03/2022    [provider]      Allergies    Aspirin, Bee pollen, Haloperidol lactate, Latex, and Erythromycin    Review of Systems   Review of Systems  Physical Exam Updated Vital Signs BP 135/77   Pulse 87   Temp 98.9  F (37.2 C) (Oral)   Resp 15   Ht 5\' 2"  (1.575 m)   Wt 53.5 kg   SpO2 92%   BMI 21.58 kg/m  Physical Exam Vitals and nursing note reviewed.  Constitutional:      General: She is not in acute distress.    Appearance: She is well-developed.  HENT:     Head: Normocephalic and atraumatic.     Mouth/Throat:     Mouth: Mucous membranes are moist.  Eyes:     General: Vision grossly intact. Gaze aligned appropriately.     Extraocular Movements: Extraocular movements intact.     Conjunctiva/sclera: Conjunctivae normal.  Cardiovascular:     Rate and Rhythm: Normal  rate and regular rhythm.     Pulses: Normal pulses.     Heart sounds: Normal heart sounds, S1 normal and S2 normal. No murmur heard.    No friction rub. No gallop.  Pulmonary:     Effort: Pulmonary effort is normal. Prolonged expiration present. No respiratory distress.     Breath sounds: Decreased air movement present. Wheezing present.  Abdominal:     General: Bowel sounds are normal.     Palpations: Abdomen is soft.     Tenderness: There is no abdominal tenderness. There is no guarding or rebound.     Hernia: No hernia is present.  Musculoskeletal:        General: No swelling.     Cervical back: Full passive range of motion without pain, normal range of motion and neck supple. No spinous process tenderness or muscular tenderness. Normal range of motion.     Right lower leg: No edema.     Left lower leg: No edema.  Skin:    General: Skin is warm and dry.     Capillary Refill: Capillary refill takes less than 2 seconds.     Findings: No ecchymosis, erythema, rash or wound.  Neurological:     General: No focal deficit present.     Mental Status: She is alert and oriented to person, place, and time.     GCS: GCS eye subscore is 4. GCS verbal subscore is 5. GCS motor subscore is 6.     Cranial Nerves: Cranial nerves 2-12 are intact.     Sensory: Sensation is intact.     Motor: Motor function is intact.     Coordination: Coordination is intact.  Psychiatric:        Attention and Perception: Attention normal.        Mood and Affect: Mood normal.        Speech: Speech normal.        Behavior: Behavior normal.     ED Results / Procedures / Treatments   Labs (all labs ordered are listed, but only abnormal results are displayed) Labs Reviewed  BASIC METABOLIC PANEL - Abnormal; Notable for the following components:      Result Value   BUN 5 (*)    All other components within normal limits  URINALYSIS, ROUTINE W REFLEX MICROSCOPIC - Abnormal; Notable for the following components:    Color, Urine COLORLESS (*)    Specific Gravity, Urine 1.002 (*)    Hgb urine dipstick MODERATE (*)    All other components within normal limits  CBC  TROPONIN I (HIGH SENSITIVITY)  TROPONIN I (HIGH SENSITIVITY)    EKG EKG Interpretation Date/Time:  Tuesday October 09 2022 22:00:26 EDT Ventricular Rate:  90 PR Interval:  192 QRS Duration:  82 QT Interval:  366 QTC Calculation: 447 R Axis:   78  Text Interpretation: Sinus rhythm with sinus arrhythmia with occasional Premature ventricular complexes Nonspecific ST abnormality Abnormal ECG When compared with ECG of 07-Feb-2022 19:33, No significant change since last tracing Confirmed by Meridee Score [phone]) on 10/09/2022 10:06:37 PM  Radiology DG Chest 2 View  Result Date: 10/09/2022 CLINICAL DATA:  Left chest pain radiating down left arm EXAM: CHEST - 2 VIEW COMPARISON:  09/07/2022 FINDINGS: Biapical scarring. No acute confluent airspace opacities or effusions. Heart mediastinal contours are within normal limits. No acute bony abnormality. IMPRESSION: No active cardiopulmonary disease. Electronically Signed   By: Charlett Nose M.D.   On: 10/09/2022 23:07    Procedures Procedures  {Document cardiac monitor, telemetry assessment procedure when appropriate:1}  Medications Ordered in ED Medications - No data to display  ED Course/ Medical Decision Making/ A&P   {   Click here for ABCD2, HEART and other calculatorsREFRESH Note before signing :1}                              Medical Decision Making Amount and/or Complexity of Data Reviewed Labs: ordered. Radiology: ordered.   ***  {Document critical care time when appropriate:1} {Document review of labs and clinical decision tools ie heart score, Chads2Vasc2 etc:1}  {Document your independent review of radiology images, and any outside records:1} {Document your discussion with family members, caretakers, and with consultants:1} {Document social determinants of health  affecting pt's care:1} {Document your decision making why or why not admission, treatments were needed:1} Final Clinical Impression(s) / ED Diagnoses Final diagnoses:  None    Rx / DC Orders ED Discharge Orders     None

## 2022-10-18 ENCOUNTER — Encounter (HOSPITAL_COMMUNITY): Payer: Self-pay

## 2022-10-18 ENCOUNTER — Emergency Department (HOSPITAL_COMMUNITY): Payer: MEDICAID

## 2022-10-18 ENCOUNTER — Other Ambulatory Visit: Payer: Self-pay

## 2022-10-18 ENCOUNTER — Emergency Department (HOSPITAL_COMMUNITY)
Admission: EM | Admit: 2022-10-18 | Discharge: 2022-10-19 | Discharge status: Against Medical Advice | Payer: MEDICAID | Attending: Emergency Medicine | Admitting: Emergency Medicine

## 2022-10-18 DIAGNOSIS — R509 Fever, unspecified: Secondary | ICD-10-CM | POA: Insufficient documentation

## 2022-10-18 DIAGNOSIS — H538 Other visual disturbances: Secondary | ICD-10-CM | POA: Insufficient documentation

## 2022-10-18 DIAGNOSIS — I10 Essential (primary) hypertension: Secondary | ICD-10-CM | POA: Diagnosis not present

## 2022-10-18 DIAGNOSIS — R059 Cough, unspecified: Secondary | ICD-10-CM | POA: Diagnosis not present

## 2022-10-18 DIAGNOSIS — R519 Headache, unspecified: Secondary | ICD-10-CM | POA: Diagnosis present

## 2022-10-18 DIAGNOSIS — Z91040 Latex allergy status: Secondary | ICD-10-CM | POA: Diagnosis not present

## 2022-10-18 DIAGNOSIS — Z20822 Contact with and (suspected) exposure to covid-19: Secondary | ICD-10-CM | POA: Insufficient documentation

## 2022-10-18 DIAGNOSIS — Z79899 Other long term (current) drug therapy: Secondary | ICD-10-CM | POA: Insufficient documentation

## 2022-10-18 DIAGNOSIS — R55 Syncope and collapse: Secondary | ICD-10-CM | POA: Insufficient documentation

## 2022-10-18 DIAGNOSIS — R7309 Other abnormal glucose: Secondary | ICD-10-CM | POA: Insufficient documentation

## 2022-10-18 LAB — CBC WITH DIFFERENTIAL/PLATELET
Abs Immature Granulocytes: 0.01 10*3/uL (ref 0.00–0.07)
Basophils Absolute: 0.1 10*3/uL (ref 0.0–0.1)
Basophils Relative: 1 %
Eosinophils Absolute: 0.4 10*3/uL (ref 0.0–0.5)
Eosinophils Relative: 5 %
HCT: 43.7 % (ref 36.0–46.0)
Hemoglobin: 14.6 g/dL (ref 12.0–15.0)
Immature Granulocytes: 0 %
Lymphocytes Relative: 40 %
Lymphs Abs: 3.4 10*3/uL (ref 0.7–4.0)
MCH: 29.3 pg (ref 26.0–34.0)
MCHC: 33.4 g/dL (ref 30.0–36.0)
MCV: 87.8 fL (ref 80.0–100.0)
Monocytes Absolute: 0.5 10*3/uL (ref 0.1–1.0)
Monocytes Relative: 6 %
Neutro Abs: 4.1 10*3/uL (ref 1.7–7.7)
Neutrophils Relative %: 48 %
Platelets: 309 10*3/uL (ref 150–400)
RBC: 4.98 MIL/uL (ref 3.87–5.11)
RDW: 13.6 % (ref 11.5–15.5)
WBC: 8.5 10*3/uL (ref 4.0–10.5)
nRBC: 0 % (ref 0.0–0.2)

## 2022-10-18 LAB — BASIC METABOLIC PANEL
Anion gap: 7 (ref 5–15)
BUN: 7 mg/dL (ref 6–20)
CO2: 26 mmol/L (ref 22–32)
Calcium: 8.8 mg/dL — ABNORMAL LOW (ref 8.9–10.3)
Chloride: 104 mmol/L (ref 98–111)
Creatinine, Ser: 0.72 mg/dL (ref 0.44–1.00)
GFR, Estimated: >60 mL/min (ref >60)
Glucose, Bld: 111 mg/dL — ABNORMAL HIGH (ref 70–99)
Potassium: 3.7 mmol/L (ref 3.5–5.1)
Sodium: 137 mmol/L (ref 135–145)

## 2022-10-18 MED ORDER — AMLODIPINE BESYLATE 5 MG PO TABS: 5.0000 mg | ORAL_TABLET | Freq: Every day | ORAL | 0 refills | Status: DC

## 2022-10-18 MED ORDER — METOCLOPRAMIDE HCL 5 MG/ML IJ SOLN
10.0000 mg | Freq: Once | INTRAMUSCULAR | Status: AC
  Administered 2022-10-18: 10 mg via INTRAVENOUS
  Filled 2022-10-18: qty 2

## 2022-10-18 NOTE — ED Provider Notes (Signed)
[place] EMERGENCY DEPARTMENT AT [facility] Provider Note   CSN: [id number] Arrival date & time: 10/18/22  2159     History  Chief Complaint  Patient presents with   Headache    Alexandra Sanchez is a 57 y.o. female..  She has had a general headache that began yesterday and worsened today.  She also says she has fainted a couple times today.  Feels hot and cold.  She has a cough but she says that is chronic from smoking.  She denies any vomiting or diarrhea.  She also said she has been out of her blood pressure medication.  Sometimes has blurry vision.  The history is provided by the patient.  Headache Pain location:  Generalized Quality:  Dull Severity currently:  10/10 Severity at highest:  10/10 Onset quality:  Gradual Duration:  2 days Timing:  Constant Progression:  Worsening Chronicity:  New Relieved by:  Nothing Worsened by:  Nothing Ineffective treatments:  NSAIDs Associated symptoms: blurred vision, cough, fever and syncope   Associated symptoms: no abdominal pain, no nausea, no neck pain, no numbness, no vomiting and no weakness        Home Medications Prior to Admission medications   Medication Sig Start Date End Date Taking? Authorizing Provider  ALPRAZolam (XANAX XR) 0.5 MG 24 hr tablet Take 0.5 mg by mouth daily.    [provider]  atorvastatin (LIPITOR) 40 MG tablet Take 40 mg by mouth daily.    [provider]  betamethasone valerate ointment (VALISONE) 0.1 % Apply topically 2 (two) times daily. Patient not taking: Reported on 02/03/2022 01/11/22   [provider]  cefdinir (OMNICEF) 300 MG capsule Take 1 capsule (300 mg total) by mouth 2 (two) times daily. 10/10/22   Gilda Crease, MD  fluticasone (FLONASE) 50 MCG/ACT nasal spray Place 2 sprays into both nostrils daily.    [provider]  gabapentin (NEURONTIN) 300 MG capsule Take 300 mg by mouth at bedtime. Patient not taking: Reported on  02/03/2022    [provider]  hydrochlorothiazide (HYDRODIURIL) 25 MG tablet Take 25 mg by mouth daily.    [provider]  ibuprofen (ADVIL) 200 MG tablet Take 800 mg by mouth every 6 (six) hours as needed for fever, headache or mild pain.    [provider]  levETIRAcetam (KEPPRA) 500 MG tablet Take 1 tablet (500 mg total) by mouth 2 (two) times daily. Patient not taking: Reported on 10/16/2021 03/20/20   Sabino Dick, DO  lidocaine (LIDODERM) 5 % APPLY 1 PATCH BY TRANSDERMAL ROUTE ONCE DAILY (MAY WEAR UP TO 12HOURS.) Patient not taking: Reported on 02/03/2022    [provider]  omeprazole (PRILOSEC) 40 MG capsule Take 40 mg by mouth daily. 02/13/22   [provider]  predniSONE (DELTASONE) 20 MG tablet Take 2 tablets (40 mg total) by mouth daily with breakfast. 10/10/22   Pollina, Canary Brim, MD  PROAIR HFA 108 [phone] Base) MCG/ACT inhaler 2 puffs every 4 (four) hours as needed. 06/10/20   [provider]  propranolol (INDERAL) 40 MG tablet Take 40 mg by mouth 2 (two) times daily. Patient not taking: Reported on 02/03/2022    [provider]  QUEtiapine (SEROQUEL) 25 MG tablet Take 1 tablet every day by oral route at bedtime for 30 days. Patient not taking: Reported on 02/03/2022 03/29/17   [provider]  sertraline (ZOLOFT) 50 MG tablet Take 50 mg by mouth daily.    [provider]  SYMBICORT 160-4.5 MCG/ACT inhaler SMARTSIG:2 Puff(s) By Mouth Twice Daily Patient not taking: Reported on 02/03/2022 11/08/21   [provider]  triamcinolone cream (KENALOG) 0.1 % APPLY THIN COAT TO AFFECTED AREA TWICE A DAY Patient not taking: Reported on 02/03/2022    [provider]  venlafaxine XR (EFFEXOR-XR) 75 MG 24 hr capsule TAKE 1 CAPSULE BY MOUTH EVERY DAY IN THE MORNING Patient not taking: Reported on 02/03/2022    [provider]      Allergies    Aspirin, Bee pollen, Haloperidol lactate, Latex,  and Erythromycin    Review of Systems   Review of Systems  Constitutional:  Positive for chills and fever.  Eyes:  Positive for blurred vision and visual disturbance.  Respiratory:  Positive for cough.   Cardiovascular:  Positive for syncope. Negative for chest pain.  Gastrointestinal:  Negative for abdominal pain, nausea and vomiting.  Genitourinary:  Negative for dysuria.  Musculoskeletal:  Negative for neck pain.  Neurological:  Positive for syncope and headaches. Negative for weakness and numbness.    Physical Exam Updated Vital Signs BP (!) 153/102 (BP Location: Right Arm)   Pulse 92   Resp 16   Ht 5\' 2"  (1.575 m)   Wt 54 kg   SpO2 95%   BMI 21.77 kg/m  Physical Exam Vitals and nursing note reviewed.  Constitutional:      General: She is not in acute distress.    Appearance: She is well-developed.  HENT:     Head: Normocephalic and atraumatic.  Eyes:     Conjunctiva/sclera: Conjunctivae normal.  Cardiovascular:     Rate and Rhythm: Normal rate and regular rhythm.     Heart sounds: No murmur heard. Pulmonary:     Effort: Pulmonary effort is normal. No respiratory distress.     Breath sounds: Normal breath sounds.  Abdominal:     Palpations: Abdomen is soft.     Tenderness: There is no abdominal tenderness.  Musculoskeletal:        General: No swelling.     Cervical back: Neck supple.  Skin:    General: Skin is warm and dry.     Capillary Refill: Capillary refill takes less than 2 seconds.  Neurological:     Mental Status: She is alert.     GCS: GCS eye subscore is 4. GCS verbal subscore is 5. GCS motor subscore is 6.     Cranial Nerves: No cranial nerve deficit.     Sensory: No sensory deficit.     Motor: No weakness.     ED Results / Procedures / Treatments   Labs (all labs ordered are listed, but only abnormal results are displayed) Labs Reviewed  BASIC METABOLIC PANEL - Abnormal; Notable for the following components:      Result Value   Glucose,  Bld 111 (*)    Calcium 8.8 (*)    All other components within normal limits  RESP PANEL BY RT-PCR (RSV, FLU A&B, COVID)  [id number]  CBC WITH DIFFERENTIAL/PLATELET    EKG EKG Interpretation Date/Time:  Thursday October 18 2022 22:32:37 EDT Ventricular Rate:  95 PR Interval:  197 QRS Duration:  89 QT Interval:  359 QTC Calculation: 452 R Axis:   83  Text Interpretation: Sinus rhythm Borderline prolonged PR interval No significant change since prior 10/24 Confirmed by Meridee Score [phone]) on 10/18/2022 10:36:09 PM  Radiology DG Chest Port 1 View  Result Date: 10/18/2022 CLINICAL DATA:  Cough, headache. EXAM:  PORTABLE CHEST 1 VIEW COMPARISON:  10/09/2022. FINDINGS: The heart size and mediastinal contours are within normal limits. There is atherosclerotic calcification of the aorta. No consolidation, effusion, or pneumothorax. Cervical spinal fusion hardware is noted. No acute osseous abnormality. IMPRESSION: No active disease. Electronically Signed   By: Thornell Sartorius M.D.   On: 10/18/2022 23:38   CT Head Wo Contrast  Result Date: 10/18/2022 CLINICAL DATA:  New onset headache. Out of blood pressure medication. EXAM: CT HEAD WITHOUT CONTRAST TECHNIQUE: Contiguous axial images were obtained from the base of the skull through the vertex without intravenous contrast. RADIATION DOSE REDUCTION: This exam was performed according to the departmental dose-optimization program which includes automated exposure control, adjustment of the mA and/or kV according to patient size and/or use of iterative reconstruction technique. COMPARISON:  CT HEAD 09/07/2022 FINDINGS: Brain: No intracranial hemorrhage, mass effect, or evidence of acute infarct. No hydrocephalus. No extra-axial fluid collection. Vascular: No hyperdense vessel or unexpected calcification. Skull: No fracture or focal lesion. Sinuses/Orbits: No acute finding. Other: None. IMPRESSION: No acute intracranial abnormality. Electronically Signed    By: Minerva Fester M.D.   On: 10/18/2022 23:33    Procedures Procedures    Medications Ordered in ED Medications  metoCLOPramide (REGLAN) injection 10 mg (has no administration in time range)    ED Course/ Medical Decision Making/ A&P Clinical Course as of 10/19/22 1011  Thu Oct 18, 2022  2258 Chest x-ray interpreted by me as no acute infiltrate.  Awaiting radiology reading. [MB]    Clinical Course User Index [MB] Terrilee Files, MD                                 Medical Decision Making Amount and/or Complexity of Data Reviewed Labs: ordered. Radiology: ordered.  Risk Prescription drug management.   This patient complains of headache elevated blood pressure; this involves an extensive number of treatment Options and is a complaint that carries with it a high risk of complications and morbidity. The differential includes headache, bleed, migraine, dehydration, infection  I ordered, reviewed and interpreted labs, which included CBC normal chemistries normal other than mildly elevated glucose COVID and flu negative I ordered medication IV Reglan and reviewed PMP when indicated. I ordered imaging studies which included chest x-ray and CT head and I independently    visualized and interpreted imaging which showed no acute findings Previous records obtained and reviewed in epic, patient frequently in the ED for various complaints Cardiac monitoring reviewed, sinus rhythm Social determinants considered, tobacco use and depression Critical Interventions: None  After the interventions stated above, I reevaluated the patient and found patient to be resting comfortably Admission and further testing considered, her care is signed out to Dr. Preston Fleeting to reassess after results of labs and imaging are back.  Likely can be discharged and needs to start on antihypertensive.         Final Clinical Impression(s) / ED Diagnoses Final diagnoses:  Headache disorder  Uncontrolled  hypertension  Syncope, unspecified syncope type    Rx / DC Orders ED Discharge Orders          Ordered    amLODipine (NORVASC) 5 MG tablet  Daily        10/18/22 2307              Terrilee Files, MD 10/19/22 1014

## 2022-10-18 NOTE — ED Triage Notes (Signed)
Pt reports headache since yesterday and is out of BP medicine since she can not see her PCP until November.

## 2022-10-18 NOTE — ED Notes (Signed)
Patient transported to CT/XRay.

## 2022-10-18 NOTE — Discharge Instructions (Signed)

## 2022-10-18 NOTE — ED Provider Notes (Signed)
Care assumed from Dr. Charm Barges, patient with headache, syncope - pending response to migraine cocktail, respiratory pathogen panel. Will need to be discharged on antihypertensives.  Patient eloped before lab results came back.  I have reviewed the results of respiratory pathogen panel and my interpretation is no evidence of RSV, influenza or COVID-19 infection.  Results for orders placed or performed during the hospital encounter of 10/18/22  Resp panel by RT-PCR (RSV, Flu A&B, Covid) Anterior Nasal Swab   Specimen: Anterior Nasal Swab  Result Value Ref Range   SARS Coronavirus 2 by RT PCR NEGATIVE NEGATIVE   Influenza A by PCR NEGATIVE NEGATIVE   Influenza B by PCR NEGATIVE NEGATIVE   Resp Syncytial Virus by PCR NEGATIVE NEGATIVE     Dione Booze, MD 10/19/22 0031

## 2022-10-19 LAB — RESP PANEL BY RT-PCR (RSV, FLU A&B, COVID)  RVPGX2
Influenza A by PCR: NEGATIVE
Influenza B by PCR: NEGATIVE
Resp Syncytial Virus by PCR: NEGATIVE
SARS Coronavirus 2 by RT PCR: NEGATIVE

## 2022-10-19 NOTE — ED Notes (Signed)
Not not visualized in room since 11PM. Reported that she eloped with some papers in hand.   Unsure if PIV remains in place. Attempts to contact pt unsuccessful.

## 2022-10-19 NOTE — ED Provider Notes (Incomplete)
Care assumed from Dr. Charm Barges, patient with headache, syncope - pending response to migraine cocktail, respiratory pathogen panel. Will need to be discharged on antihypertensives.  Patient eloped before lab results came back.

## 2022-12-28 ENCOUNTER — Encounter (HOSPITAL_COMMUNITY): Payer: Self-pay | Admitting: *Deleted

## 2022-12-28 ENCOUNTER — Emergency Department (HOSPITAL_COMMUNITY)
Admission: EM | Admit: 2022-12-28 | Discharge: 2022-12-28 | Disposition: A | Discharge status: Home / Self Care | Payer: MEDICAID | Attending: Emergency Medicine | Admitting: Emergency Medicine

## 2022-12-28 ENCOUNTER — Other Ambulatory Visit: Payer: Self-pay

## 2022-12-28 ENCOUNTER — Emergency Department (HOSPITAL_COMMUNITY): Payer: MEDICAID

## 2022-12-28 DIAGNOSIS — R6884 Jaw pain: Secondary | ICD-10-CM | POA: Insufficient documentation

## 2022-12-28 DIAGNOSIS — J449 Chronic obstructive pulmonary disease, unspecified: Secondary | ICD-10-CM | POA: Diagnosis not present

## 2022-12-28 MED ORDER — NAPROXEN 500 MG PO TABS: 500.0000 mg | ORAL_TABLET | Freq: Two times a day (BID) | ORAL | 0 refills | Status: DC

## 2022-12-28 MED ORDER — KETOROLAC TROMETHAMINE 15 MG/ML IJ SOLN
30.0000 mg | Freq: Once | INTRAMUSCULAR | Status: AC
  Administered 2022-12-28: 30 mg via INTRAMUSCULAR
  Filled 2022-12-28: qty 2

## 2022-12-28 NOTE — ED Triage Notes (Signed)
Pt states she has a hx of swollen salivary glands and she had one inflamed x 2 days ago and states the swelling has gone down but now she is having severe pain to right jaw
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
## 2022-12-28 NOTE — ED Notes (Signed)
Pt given warm blanket and heat pack.

## 2022-12-28 NOTE — Discharge Instructions (Signed)
You were evaluated in the Emergency Department and after careful evaluation, we did not find any emergent condition requiring admission or further testing in the hospital.  Your exam/testing today is overall reassuring.  Recommend use of the Naprosyn anti-inflammatory twice daily for pain and follow-up with ENT if symptoms do not improve.  Please return to the Emergency Department if you experience any worsening of your condition.   Thank you for allowing [place] to be a part of your care.

## 2023-09-30 ENCOUNTER — Ambulatory Visit (INDEPENDENT_AMBULATORY_CARE_PROVIDER_SITE_OTHER): Payer: MEDICAID

## 2023-09-30 ENCOUNTER — Ambulatory Visit (INDEPENDENT_AMBULATORY_CARE_PROVIDER_SITE_OTHER): Payer: MEDICAID | Admitting: Podiatry

## 2023-09-30 VITALS — Ht 63.0 in | Wt 105.0 lb

## 2023-09-30 DIAGNOSIS — M2012 Hallux valgus (acquired), left foot: Secondary | ICD-10-CM | POA: Diagnosis not present

## 2023-09-30 DIAGNOSIS — M21619 Bunion of unspecified foot: Secondary | ICD-10-CM

## 2023-09-30 DIAGNOSIS — M21612 Bunion of left foot: Secondary | ICD-10-CM | POA: Diagnosis not present
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
## 2023-10-03 ENCOUNTER — Telehealth: Payer: Self-pay | Admitting: Podiatry

## 2023-10-03 NOTE — Telephone Encounter (Signed)
 Pt left 2 messages stating she was calling about scheduling surgery with Dr Janit.   I returned call and pt is scheduled for 10/9 and is not on blood thinners or GLP1 medications. Pharmacy correct in chart. Pt also aware will need driver

## 2023-10-04 ENCOUNTER — Telehealth: Payer: Self-pay | Admitting: Podiatry

## 2023-10-04 NOTE — Telephone Encounter (Signed)
 DOS- 10/17/2023  [place] BUNIONECTOMY LT- 71703  [facility] EFFECTIVE DATE- 09/09/2023  PER CODE LIST LOCATED ON [facility] WEBSITE, NO PRIOR AUTH IS REQUIRED FOR CPT CODE 71703.

## 2023-10-17 ENCOUNTER — Other Ambulatory Visit: Payer: Self-pay | Admitting: Podiatry

## 2023-10-17 DIAGNOSIS — M2012 Hallux valgus (acquired), left foot: Secondary | ICD-10-CM | POA: Diagnosis not present

## 2023-10-17 MED ORDER — IBUPROFEN 800 MG PO TABS: 800.0000 mg | ORAL_TABLET | Freq: Three times a day (TID) | ORAL | 1 refills | Status: DC

## 2023-10-17 MED ORDER — OXYCODONE-ACETAMINOPHEN 5-325 MG PO TABS: 1.0000 | ORAL_TABLET | ORAL | 0 refills | Status: DC | PRN

## 2023-10-17 NOTE — Progress Notes (Signed)
 PRN postop

## 2023-10-18 ENCOUNTER — Telehealth: Payer: Self-pay | Admitting: Podiatry

## 2023-10-18 ENCOUNTER — Other Ambulatory Visit: Payer: Self-pay | Admitting: Podiatry

## 2023-10-18 MED ORDER — OXYCODONE-ACETAMINOPHEN 5-325 MG PO TABS: 1.0000 | ORAL_TABLET | ORAL | 0 refills | Status: DC | PRN

## 2023-10-18 NOTE — Telephone Encounter (Signed)
 Patient states dog ate her percocet pills.  he just moved and doesn't have a nightstand and the bottle was on the floor. Her dog chewed through the bottle and ate the pills. Dog is at vet recovering from getting tummy pumped.She asks if you can send another script.  She mentioned she doesn't have help at home and has to get around. She is walking with the boot on. Should sh be wearing the boot while walking?

## 2023-10-18 NOTE — Telephone Encounter (Signed)
 Patient states that pharmacy suggested when sending the order make a note that the dog ate the pills .Because it may help with insurance paying for it.   She mentioned she does have proof that the dog is at the vet due to eating the pills.

## 2023-10-18 NOTE — Progress Notes (Signed)
 PRN postop.  Patient states that her dog chewed through the bottle and ate the pills of Percocet.  According the patient she has veterinary documentation supporting the event.   Thresa EMERSON Sar, DPM [facility]  Dr. Thresa EMERSON Sar, DPM    [address] [place], [place] [address]                Office [phone]  Fax [phone]

## 2023-10-23 ENCOUNTER — Ambulatory Visit: Payer: MEDICAID | Admitting: Podiatry

## 2023-10-23 ENCOUNTER — Ambulatory Visit (INDEPENDENT_AMBULATORY_CARE_PROVIDER_SITE_OTHER): Payer: MEDICAID

## 2023-10-23 VITALS — BP 139/78 | HR 91 | Temp 98.2°F

## 2023-10-23 DIAGNOSIS — Z9889 Other specified postprocedural states: Secondary | ICD-10-CM

## 2023-10-23 DIAGNOSIS — M2012 Hallux valgus (acquired), left foot: Secondary | ICD-10-CM
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
## 2023-10-23 NOTE — Progress Notes (Signed)
 Patient presents for post-op visit today, POV # 1 DOS 10/17/23 LT FOOT BUNIONECTOMY W/ OSTEOTOMY Had fall up the stairs going home night of surgery. That night bled through to the boot, boyfriend helped unwrap the foot. Tried redressing but it kept falling off. Next night went to ER. They wrapped it. I threw the boot away, I think it is in my boyfriend's car. This was on the 10th. My leg hurt so bad. I know I was not supposed to be walking on it but I had to make money.  RN Notes: Patient presents not in AFW and full weight bearing. When asked about boot, patient reported throwing it away because of leg pain. Patient also reports that her dog at 42 of the Percocet pills. Patient denies pain at this time.    Vital Signs: Today's Vitals   10/23/23 1102  BP: 139/78  Pulse: 91  Temp: 98.2 F (36.8 C)  PainSc: 0-No pain    Radiographs: [x]  Taken []  Not taken  Surgical Site Assessment:  - Dressing:  []  Minimal dry blood, intact []  Reinforced   []  Changed     -RN Notes: No dressing in place, patient removed prior to appointment.  - Incision:  [x]  CDI (clean, dry, intact)  []  Mild erythema  []  Drainage noted   -RN Notes: moderate erythema.   - Swelling:  []  None  []  Mild  [x]  Moderate   []  Significant    - Bruising:  []  None  [x]  Present: 1-5 toes, medial and lateral aspects of foot.   - Sutures/staples:  [x]  Intact  []  Removed Today  [x]  Plan to remove at next visit    -Cast/Splint/Pins: [x]  None []  Intact  []  Removed []  Plan to remove at next visit []  Replaced  -Signs of infection:  []  None  [x]  Present - Describe: redness, warmth, swelling.  -DME:    []  AFW []  Surgical shoe []  Cast  []  Splint  -Walking status:  [x]  Full WB  []  Partial WB  []  NWB  -Utilizing device:  [x]  None []  Knee Scooter []  Crutches []  Wheelchair    DVT assessment:  [x]  Denies symptoms []  Chest pain/SOB []  Pain in calf/redness/warmth   Redressed DSD and ace wrap. Educated on signs of infection, proper  dressing care, pain management, and weight bearing status. Patient will contact provider with any new or worsening symptoms. The provider assessed the patient today and reviewed instructions regarding plan of care.

## 2023-10-30 ENCOUNTER — Telehealth: Payer: Self-pay | Admitting: Podiatry

## 2023-10-30 ENCOUNTER — Ambulatory Visit (INDEPENDENT_AMBULATORY_CARE_PROVIDER_SITE_OTHER): Payer: MEDICAID | Admitting: Podiatry

## 2023-10-30 VITALS — BP 150/86 | HR 89 | Temp 98.2°F

## 2023-10-30 DIAGNOSIS — M2012 Hallux valgus (acquired), left foot: Secondary | ICD-10-CM

## 2023-10-30 NOTE — Progress Notes (Signed)
 Patient presents for post-op visit today, POV # 2 DOS 10/17/23 LT FOOT BUNIONECTOMY W/ OSTEOTOMY  Absolutely killing me. I have a fever of 100 last night. Went to [facility] and was told I had an infection. Called in antibiotic. This has been going on a couple of days..  RN Notes: n/a  Vital Signs: Today's Vitals   10/30/23 1100  BP: (!) 150/86  Pulse: 89  Temp: 98.2 F (36.8 C)  TempSrc: Oral  Pain rating right now is 10/10    Radiographs: []  Taken [x]  Not taken  Surgical Site Assessment:  - Dressing:  []  Minimal dry blood, intact []  Reinforced   []  Changed     -RN Notes: no dressing in place.   - Incision:  []  CDI (clean, dry, intact)  []  Mild erythema  []  Drainage noted   -RN Notes: Patient reports pus coming out last night. Moderate erythema .  - Swelling:  []  None  []  Mild  []  Moderate   [x]  Significant     -RN Notes: n/a  - Bruising:  [x]  None  []  Present: n/a   - Sutures/Staples:  []  None [x]  Mostly Intact, some have come undone []  Removed Today  []  Plan to remove at next visit   -Cast/Splint/Pins: [x]  None []  Intact []  Removed Today []  Plan to remove at next visit []  Replaced  -Signs of infection:  []  None  [x]  Present - Describe: significant redness, swelling, fever at home, nausea reported by patient, purulent drainage reported by patient.   -DME:    [x]  None []  AFW []  Surgical shoe []  Cast  []  Splint  -Walking status:  [x]  Full WB  []  Partial WB  []  NWB  -Utilizing device:  [x]  None []  Knee Scooter []  Crutches []  Wheelchair    DVT assessment:  [x]  Denies symptoms []  Chest pain/SOB []  Pain in calf/redness/warmth   Redressed DSD and ace wrap. Educated on signs of infection, proper dressing care, pain management, and weight bearing status. Patient will contact provider with any new or worsening symptoms. The provider assessed the patient today and reviewed instructions regarding plan of care.
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
## 2023-10-30 NOTE — Telephone Encounter (Signed)
 Patient called in asking could you subscribe a pain medication.

## 2023-10-31 ENCOUNTER — Telehealth: Payer: Self-pay | Admitting: Podiatry

## 2023-10-31 NOTE — Telephone Encounter (Signed)
 Patient called back on 10/31/23 requesting Dr. Janit provide something for the pain she is experiencing

## 2023-11-01 ENCOUNTER — Other Ambulatory Visit: Payer: Self-pay | Admitting: Podiatry

## 2023-11-01 MED ORDER — OXYCODONE-ACETAMINOPHEN 5-325 MG PO TABS: 1.0000 | ORAL_TABLET | Freq: Four times a day (QID) | ORAL | 0 refills | Status: DC | PRN

## 2023-11-01 NOTE — Progress Notes (Signed)
 PRN postop

## 2023-11-13 ENCOUNTER — Ambulatory Visit (INDEPENDENT_AMBULATORY_CARE_PROVIDER_SITE_OTHER): Payer: MEDICAID

## 2023-11-13 ENCOUNTER — Ambulatory Visit (INDEPENDENT_AMBULATORY_CARE_PROVIDER_SITE_OTHER): Payer: MEDICAID | Admitting: Podiatry

## 2023-11-13 DIAGNOSIS — M2012 Hallux valgus (acquired), left foot: Secondary | ICD-10-CM
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
## 2023-11-21 NOTE — ED Provider Notes (Addendum)
 [facility]  Emergency Department Provider Note     History   Chief Complaint Fatigue, Dizziness, and Shoulder Pain   HPI  Alexandra Sanchez is a 58 y.o. female feels tired with no energy she can sleep all day and then wake up and still feel tired.  This has been ongoing for about 1 week.  Even though she has history of seizures she has not been taking her seizure medications for about 1 year according to the patient.  No vomiting no diarrhea no fever chills coughing runny nose sore throat no headache or neck pain or stiffness no facial pain denies any chest pressure or heaviness no abdominal pain or back pain no recent fall or injury no urinary symptoms denies any bloody or black stool or loose stools no rash.  No joint pains.  No recent travel history.  No calf or leg pains.  Patient looks very comfortable without any obvious distress or discomfort.    Past Medical History: No date: Anxiety 07/29/2019: Chest pain No date: COPD (chronic obstructive pulmonary disease) (CMS-HCC) 07/29/2019: COPD with acute exacerbation    (CMS-HCC) No date: Depression 07/29/2019: Epilepsy    (CMS-HCC) No date: History of DVT (deep vein thrombosis) No date: Hyperlipidemia 07/29/2019: Hypoxemia 07/29/2019: Noncompliance with medications 07/29/2019: Pleuritic chest pain No date: Seizures    (CMS-HCC) 07/29/2019: Tobacco abuse disorder  Past Surgical History: No date: APPENDECTOMY No date: BACK SURGERY No date: CERVICAL SPINE SURGERY No date: CHOLECYSTECTOMY No date: HYSTERECTOMY No date: KNEE SURGERY; Right No date: TONSILLECTOMY  Prior to Admission medications  Medication Dose, Route, Frequency  albuterol  HFA 90 mcg/actuation inhaler 2 puffs, Inhalation, Every 4 hours PRN  amlodipine  (NORVASC ) 5 MG tablet 5 mg, Daily (standard)  atorvastatin  (LIPITOR) 40 MG tablet 40 mg, Daily (standard)    Allergies Aspirin, Bee pollen, Erythromycin, Haloperidol lactate, and Latex   Derego Social  History[1]  Review of Systems  Constitutional:  Positive for malaise/fatigue.  All other systems reviewed and are negative.   As in HPI, all systems reviewed and otherwise negative.  Physical Exam    Vitals:   11/21/23 1727  BP: 148/83  Pulse: 96  Resp: 16  Temp: 37 C (98.6 F)  TempSrc: Temporal  SpO2: 97%  Weight: 58.9 kg (129 lb 12.8 oz)  Height: 160 cm (5' 3)     Physical Exam  Constitutional: Patient appears well-developed and well nourished. Non toxic in appearance. HEENT: Unremarkable. Head: Atraumatic.  Eyes: Normal ocular movements. Neck: Supple with normal range of motion.  Pulmonary/Chest: Effort normal. No respiratory distress. Abdominal: Soft and non tender abdomen. Musculoskeletal: Extremities atraumatic. Neurological: Alert with no focal neurological deficit. Ambulatory with a steady gait. Skin: Warm and dry.  Nursing note and vital signs reviewed.   ED Course        Procedures  No orders of the defined types were placed in this encounter.   ED Results No results found for any visits on 11/21/23.  Radiology No results found.   Medical Decision Making   I have reviewed the vital signs and the nursing notes. Labs and radiology results that were available during my care of the patient were independently reviewed by me and considered in my medical decision making.    This is a comfortable appearing 58 year old female with a history of depression anxiety disorder comes in with 1 week history of just not feeling refreshed or rested even after she sleeps all day when she wakes up she says she  does not feel refreshed.  Patient has had the symptoms for 1 week.  Medical Decision Making Amount and/or Complexity of Data Reviewed Labs: ordered. ECG/medicine tests: ordered.     Differential Diagnosis: Anxiety disorder, generalized anxiety disorder, depression, electrolyte abnormality, hypothyroidism     ED Clinical Impression   Final  diagnoses:  None   Fatigue  Procedures      This record has been created using [employer]. Chart creation errors have been sought, but may not always have been located. Such creation errors do not reflect on the standard of medical care.       [1] Social History Tobacco Use  . Smoking status: Every Day    Current packs/day: 0.50    Average packs/day: 0.5 packs/day for 41.0 years (20.5 ttl pk-yrs)    Types: Cigarettes  . Smokeless tobacco: Never  Vaping Use  . Vaping status: Every Day  Substance Use Topics  . Alcohol use: Never  . Drug use: Not Currently    Frequency: 7.0 times per week    Types: Marijuana   Maree Jonelle Lash, MD 11/21/23 2001    Maree Jonelle Lash, MD 11/21/23 2002

## 2023-11-25 ENCOUNTER — Telehealth: Payer: Self-pay | Admitting: Podiatry

## 2023-11-25 NOTE — Telephone Encounter (Signed)
 Patient states that the pharmacy didn't receive the script.

## 2023-12-03 ENCOUNTER — Other Ambulatory Visit: Payer: Self-pay

## 2023-12-03 ENCOUNTER — Encounter (HOSPITAL_COMMUNITY): Payer: Self-pay | Admitting: Emergency Medicine

## 2023-12-03 ENCOUNTER — Inpatient Hospital Stay (HOSPITAL_COMMUNITY)
Admission: EM | Admit: 2023-12-03 | Discharge: 2023-12-04 | DRG: 189 | Discharge status: Against Medical Advice | Payer: MEDICAID | Attending: Family Medicine | Admitting: Family Medicine

## 2023-12-03 DIAGNOSIS — G4700 Insomnia, unspecified: Secondary | ICD-10-CM | POA: Diagnosis present

## 2023-12-03 DIAGNOSIS — F411 Generalized anxiety disorder: Secondary | ICD-10-CM | POA: Diagnosis present

## 2023-12-03 DIAGNOSIS — F17210 Nicotine dependence, cigarettes, uncomplicated: Secondary | ICD-10-CM | POA: Diagnosis present

## 2023-12-03 DIAGNOSIS — Z886 Allergy status to analgesic agent status: Secondary | ICD-10-CM

## 2023-12-03 DIAGNOSIS — F329 Major depressive disorder, single episode, unspecified: Secondary | ICD-10-CM | POA: Diagnosis present

## 2023-12-03 DIAGNOSIS — Z83438 Family history of other disorder of lipoprotein metabolism and other lipidemia: Secondary | ICD-10-CM

## 2023-12-03 DIAGNOSIS — R569 Unspecified convulsions: Secondary | ICD-10-CM

## 2023-12-03 DIAGNOSIS — Z888 Allergy status to other drugs, medicaments and biological substances status: Secondary | ICD-10-CM

## 2023-12-03 DIAGNOSIS — F17290 Nicotine dependence, other tobacco product, uncomplicated: Secondary | ICD-10-CM | POA: Diagnosis present

## 2023-12-03 DIAGNOSIS — G40909 Epilepsy, unspecified, not intractable, without status epilepticus: Secondary | ICD-10-CM | POA: Diagnosis present

## 2023-12-03 DIAGNOSIS — Z833 Family history of diabetes mellitus: Secondary | ICD-10-CM

## 2023-12-03 DIAGNOSIS — E785 Hyperlipidemia, unspecified: Secondary | ICD-10-CM | POA: Diagnosis present

## 2023-12-03 DIAGNOSIS — F5000 Anorexia nervosa, unspecified: Secondary | ICD-10-CM | POA: Diagnosis present

## 2023-12-03 DIAGNOSIS — Z881 Allergy status to other antibiotic agents status: Secondary | ICD-10-CM

## 2023-12-03 DIAGNOSIS — J441 Chronic obstructive pulmonary disease with (acute) exacerbation: Secondary | ICD-10-CM | POA: Diagnosis present

## 2023-12-03 DIAGNOSIS — F17200 Nicotine dependence, unspecified, uncomplicated: Secondary | ICD-10-CM | POA: Diagnosis present

## 2023-12-03 DIAGNOSIS — J9601 Acute respiratory failure with hypoxia: Principal | ICD-10-CM | POA: Diagnosis present

## 2023-12-03 DIAGNOSIS — F419 Anxiety disorder, unspecified: Secondary | ICD-10-CM | POA: Diagnosis present

## 2023-12-03 DIAGNOSIS — Z91030 Bee allergy status: Secondary | ICD-10-CM

## 2023-12-03 DIAGNOSIS — Z91040 Latex allergy status: Secondary | ICD-10-CM

## 2023-12-03 DIAGNOSIS — J439 Emphysema, unspecified: Secondary | ICD-10-CM | POA: Diagnosis present

## 2023-12-03 DIAGNOSIS — J449 Chronic obstructive pulmonary disease, unspecified: Secondary | ICD-10-CM | POA: Diagnosis present

## 2023-12-03 DIAGNOSIS — Z79899 Other long term (current) drug therapy: Secondary | ICD-10-CM

## 2023-12-03 DIAGNOSIS — I10 Essential (primary) hypertension: Secondary | ICD-10-CM | POA: Diagnosis present

## 2023-12-03 DIAGNOSIS — Z5329 Procedure and treatment not carried out because of patient's decision for other reasons: Secondary | ICD-10-CM | POA: Diagnosis present

## 2023-12-03 DIAGNOSIS — Z818 Family history of other mental and behavioral disorders: Secondary | ICD-10-CM

## 2023-12-03 DIAGNOSIS — Z90710 Acquired absence of both cervix and uterus: Secondary | ICD-10-CM

## 2023-12-03 DIAGNOSIS — Z803 Family history of malignant neoplasm of breast: Secondary | ICD-10-CM

## 2023-12-03 NOTE — ED Triage Notes (Signed)
 Pt presents with congestion, neck and shoulder pain x a couple weeks, negative Covid yesterday at [facility], was given prednisone  and abx yesterday

## 2023-12-04 ENCOUNTER — Emergency Department (HOSPITAL_COMMUNITY): Payer: MEDICAID

## 2023-12-04 ENCOUNTER — Encounter (HOSPITAL_COMMUNITY): Payer: Self-pay | Admitting: Internal Medicine

## 2023-12-04 DIAGNOSIS — J9601 Acute respiratory failure with hypoxia: Secondary | ICD-10-CM | POA: Diagnosis not present

## 2023-12-04 DIAGNOSIS — F17200 Nicotine dependence, unspecified, uncomplicated: Secondary | ICD-10-CM | POA: Diagnosis not present

## 2023-12-04 DIAGNOSIS — J449 Chronic obstructive pulmonary disease, unspecified: Secondary | ICD-10-CM

## 2023-12-04 DIAGNOSIS — J441 Chronic obstructive pulmonary disease with (acute) exacerbation: Secondary | ICD-10-CM

## 2023-12-04 LAB — MAGNESIUM: Magnesium: 1.8 mg/dL (ref 1.7–2.4)

## 2023-12-04 LAB — I-STAT CHEM 8, ED
BUN: <3 mg/dL — ABNORMAL LOW (ref 6–20)
Calcium, Ion: 1.14 mmol/L — ABNORMAL LOW (ref 1.15–1.40)
Chloride: 100 mmol/L (ref 98–111)
Creatinine, Ser: 0.6 mg/dL (ref 0.44–1.00)
Glucose, Bld: 87 mg/dL (ref 70–99)
HCT: 41 % (ref 36.0–46.0)
Hemoglobin: 13.9 g/dL (ref 12.0–15.0)
Potassium: 3.2 mmol/L — ABNORMAL LOW (ref 3.5–5.1)
Sodium: 143 mmol/L (ref 135–145)
TCO2: 28 mmol/L (ref 22–32)

## 2023-12-04 LAB — CBC WITH DIFFERENTIAL/PLATELET
Abs Immature Granulocytes: 0.02 K/uL (ref 0.00–0.07)
Basophils Absolute: 0 K/uL (ref 0.0–0.1)
Basophils Relative: 0 %
Eosinophils Absolute: 0.2 K/uL (ref 0.0–0.5)
Eosinophils Relative: 2 %
HCT: 39.3 % (ref 36.0–46.0)
Hemoglobin: 12.4 g/dL (ref 12.0–15.0)
Immature Granulocytes: 0 %
Lymphocytes Relative: 29 %
Lymphs Abs: 2.6 K/uL (ref 0.7–4.0)
MCH: 27.6 pg (ref 26.0–34.0)
MCHC: 31.6 g/dL (ref 30.0–36.0)
MCV: 87.3 fL (ref 80.0–100.0)
Monocytes Absolute: 0.7 K/uL (ref 0.1–1.0)
Monocytes Relative: 7 %
Neutro Abs: 5.5 K/uL (ref 1.7–7.7)
Neutrophils Relative %: 62 %
Platelets: 314 K/uL (ref 150–400)
RBC: 4.5 MIL/uL (ref 3.87–5.11)
RDW: 13.7 % (ref 11.5–15.5)
WBC: 9 K/uL (ref 4.0–10.5)
nRBC: 0 % (ref 0.0–0.2)

## 2023-12-04 LAB — HIV ANTIBODY (ROUTINE TESTING W REFLEX): HIV Screen 4th Generation wRfx: NONREACTIVE

## 2023-12-04 LAB — COMPREHENSIVE METABOLIC PANEL WITH GFR
ALT: 5 U/L (ref 0–44)
AST: 13 U/L — ABNORMAL LOW (ref 15–41)
Albumin: 4.1 g/dL (ref 3.5–5.0)
Alkaline Phosphatase: 169 U/L — ABNORMAL HIGH (ref 38–126)
Anion gap: 8 (ref 5–15)
BUN: <5 mg/dL — ABNORMAL LOW (ref 6–20)
CO2: 30 mmol/L (ref 22–32)
Calcium: 8.8 mg/dL — ABNORMAL LOW (ref 8.9–10.3)
Chloride: 104 mmol/L (ref 98–111)
Creatinine, Ser: 0.6 mg/dL (ref 0.44–1.00)
GFR, Estimated: >60 mL/min (ref >60)
Glucose, Bld: 85 mg/dL (ref 70–99)
Potassium: 3.3 mmol/L — ABNORMAL LOW (ref 3.5–5.1)
Sodium: 142 mmol/L (ref 135–145)
Total Bilirubin: 0.3 mg/dL (ref 0.0–1.2)
Total Protein: 6.7 g/dL (ref 6.5–8.1)

## 2023-12-04 MED ORDER — NICOTINE 21 MG/24HR TD PT24: 21.0000 mg | MEDICATED_PATCH | Freq: Every day | TRANSDERMAL | Status: DC | PRN

## 2023-12-04 MED ORDER — DOXYCYCLINE HYCLATE 100 MG PO TABS: 100.0000 mg | ORAL_TABLET | Freq: Two times a day (BID) | ORAL | Status: DC

## 2023-12-04 MED ORDER — DEXTROMETHORPHAN POLISTIREX ER 30 MG/5ML PO SUER
30.0000 mg | Freq: Two times a day (BID) | ORAL | Status: DC | PRN
Start: 2023-12-04 — End: 2023-12-04

## 2023-12-04 MED ORDER — ONDANSETRON HCL 4 MG/2ML IJ SOLN: 4.0000 mg | Freq: Four times a day (QID) | INTRAMUSCULAR | Status: DC | PRN

## 2023-12-04 MED ORDER — FENTANYL CITRATE (PF) 100 MCG/2ML IJ SOLN: 12.5000 ug | INTRAMUSCULAR | Status: DC | PRN

## 2023-12-04 MED ORDER — PANTOPRAZOLE SODIUM 40 MG PO TBEC: 40.0000 mg | DELAYED_RELEASE_TABLET | Freq: Every evening | ORAL | Status: DC

## 2023-12-04 MED ORDER — ONDANSETRON HCL 4 MG PO TABS: 4.0000 mg | ORAL_TABLET | Freq: Four times a day (QID) | ORAL | Status: DC | PRN

## 2023-12-04 MED ORDER — IPRATROPIUM-ALBUTEROL 0.5-2.5 (3) MG/3ML IN SOLN
3.0000 mL | Freq: Four times a day (QID) | RESPIRATORY_TRACT | Status: DC
Start: 2023-12-04 — End: 2023-12-04
  Filled 2023-12-04: qty 3

## 2023-12-04 MED ORDER — IPRATROPIUM-ALBUTEROL 0.5-2.5 (3) MG/3ML IN SOLN: 3.0000 mL | RESPIRATORY_TRACT | Status: DC | PRN

## 2023-12-04 MED ORDER — ACETAMINOPHEN 325 MG PO TABS: 650.0000 mg | ORAL_TABLET | Freq: Four times a day (QID) | ORAL | Status: DC | PRN

## 2023-12-04 MED ORDER — ALBUTEROL SULFATE (2.5 MG/3ML) 0.083% IN NEBU
10.0000 mg/h | INHALATION_SOLUTION | RESPIRATORY_TRACT | Status: AC
  Administered 2023-12-04: 10 mg/h via RESPIRATORY_TRACT

## 2023-12-04 MED ORDER — BENZONATATE 100 MG PO CAPS
100.0000 mg | ORAL_CAPSULE | Freq: Once | ORAL | Status: AC
  Administered 2023-12-04: 100 mg via ORAL
  Filled 2023-12-04: qty 1

## 2023-12-04 MED ORDER — IPRATROPIUM-ALBUTEROL 0.5-2.5 (3) MG/3ML IN SOLN
3.0000 mL | RESPIRATORY_TRACT | Status: AC
  Administered 2023-12-04 (×3): 3 mL via RESPIRATORY_TRACT
  Filled 2023-12-04: qty 3
  Filled 2023-12-04: qty 6

## 2023-12-04 MED ORDER — MAGNESIUM SULFATE IN D5W 1-5 GM/100ML-% IV SOLN
1.0000 g | Freq: Once | INTRAVENOUS | Status: DC
  Filled 2023-12-04: qty 100

## 2023-12-04 MED ORDER — POTASSIUM CHLORIDE CRYS ER 20 MEQ PO TBCR
40.0000 meq | EXTENDED_RELEASE_TABLET | Freq: Once | ORAL | Status: AC
  Administered 2023-12-04: 40 meq via ORAL
  Filled 2023-12-04: qty 2

## 2023-12-04 MED ORDER — BISACODYL 5 MG PO TBEC: 5.0000 mg | DELAYED_RELEASE_TABLET | Freq: Every day | ORAL | Status: DC | PRN

## 2023-12-04 MED ORDER — ENOXAPARIN SODIUM 40 MG/0.4ML IJ SOSY: 40.0000 mg | PREFILLED_SYRINGE | INTRAMUSCULAR | Status: DC

## 2023-12-04 MED ORDER — MAGNESIUM SULFATE 2 GM/50ML IV SOLN
2.0000 g | Freq: Once | INTRAVENOUS | Status: AC
  Administered 2023-12-04: 2 g via INTRAVENOUS
  Filled 2023-12-04: qty 50

## 2023-12-04 MED ORDER — ACETAMINOPHEN 650 MG RE SUPP: 650.0000 mg | Freq: Four times a day (QID) | RECTAL | Status: DC | PRN

## 2023-12-04 MED ORDER — METHYLPREDNISOLONE SODIUM SUCC 125 MG IJ SOLR
125.0000 mg | Freq: Once | INTRAMUSCULAR | Status: AC
  Administered 2023-12-04: 125 mg via INTRAVENOUS
  Filled 2023-12-04: qty 2

## 2023-12-04 MED ORDER — DOXYCYCLINE HYCLATE 100 MG PO TABS
100.0000 mg | ORAL_TABLET | Freq: Once | ORAL | Status: AC
  Administered 2023-12-04: 100 mg via ORAL
  Filled 2023-12-04: qty 1

## 2023-12-04 MED ORDER — ALBUTEROL SULFATE (2.5 MG/3ML) 0.083% IN NEBU
INHALATION_SOLUTION | RESPIRATORY_TRACT | Status: AC
  Filled 2023-12-04: qty 12

## 2023-12-04 MED ORDER — METHYLPREDNISOLONE SODIUM SUCC 40 MG IJ SOLR: 40.0000 mg | Freq: Two times a day (BID) | INTRAMUSCULAR | Status: DC

## 2023-12-04 MED ORDER — MAGNESIUM SULFATE 50 % IJ SOLN: 3.0000 g | Freq: Once | INTRAVENOUS | Status: DC

## 2023-12-04 MED ORDER — GUAIFENESIN ER 600 MG PO TB12
600.0000 mg | ORAL_TABLET | Freq: Two times a day (BID) | ORAL | Status: DC
  Administered 2023-12-04: 600 mg via ORAL
  Filled 2023-12-04: qty 1

## 2023-12-04 MED ORDER — HYDROCODONE-ACETAMINOPHEN 5-325 MG PO TABS: 1.0000 | ORAL_TABLET | ORAL | Status: DC | PRN

## 2023-12-04 MED ORDER — CYCLOBENZAPRINE HCL 10 MG PO TABS
10.0000 mg | ORAL_TABLET | Freq: Once | ORAL | Status: AC
  Administered 2023-12-04: 10 mg via ORAL
  Filled 2023-12-04: qty 1

## 2023-12-04 MED ORDER — HYDROCODONE BIT-HOMATROP MBR 5-1.5 MG/5ML PO SOLN
5.0000 mL | Freq: Once | ORAL | Status: DC
  Filled 2023-12-04: qty 5

## 2023-12-04 NOTE — Progress Notes (Signed)
 Patient requested to leave AMA. Spoke to Dr Vicci in the hallway. RN explained the effects of low oxygen  saturation, patient stated. I am a smoker, this is not new and I will be fine. RN removed IV, patient signed AMA paperwork and left the floor via ambulation with all personal property.

## 2023-12-04 NOTE — ED Notes (Signed)
 Waiting on  continuous neb to finish before transport.

## 2023-12-04 NOTE — Hospital Course (Signed)
 58 year old female smoker with COPD and emphysema, epilepsy, hyperlipidemia, hypertension, history of leaving hospital AMA, presented to the emergency department complaining of chest congestion, neck and shoulder pain for couple of weeks.  She had been treated at an urgent care yesterday and tested negative for COVID.  She was given oral prednisone  and antibiotics.  She complains of insomnia, nonproductive cough, wheezing and coughing and chest congestion.  She was found in the ED to have a new oxygen  requirement with desaturation to 80% on room air.  She was placed on 2 L nasal cannula.  She received aggressive treatments in the ED with IV steroids, oral antibiotics and continuous nebulizers.  Unfortunately they were not able to get her weaned off supplemental oxygen  she continued to have symptoms of shortness of breath coughing and wheezing and admission was requested for management of acute COPD exacerbation failing outpatient management.

## 2023-12-04 NOTE — ED Provider Notes (Signed)
 Emergency Department Provider Note  TRIAGE NOTE: Pt presents with congestion, neck and shoulder pain x a couple weeks, negative Covid yesterday at [facility], was given prednisone  and abx yesterday   HISTORY  Chief Complaint Nasal Congestion   HPI Alexandra Sanchez is a 58 y.o. female with   a chief complaint of severe chest pain and difficulty moving the head, which has been ongoing for several weeks. The patient reports that the symptoms are primarily muscular, affecting the neck and shoulders, and are exacerbated by persistent coughing. The patient also experiences significant congestion and insomnia due to the inability to lie down without coughing. The patient has a history of COPD and asthma and has been using albuterol  for breathing treatments. Additionally, the patient has been on prednisone  for a month and was recently prescribed antibiotics at a [facility] facility, where a chest X-ray was performed and reportedly appeared normal. The patient denies any recent improvement in symptoms despite these interventions. The history was obtained from the patient.  PMH Past Medical History:  Diagnosis Date   Anxiety disorder    COPD (chronic obstructive pulmonary disease) (HCC)    Depression    Hyperlipidemia    Seizures (HCC) 08/2018    Home Medications Prior to Admission medications   Medication Sig Start Date End Date Taking? Authorizing Provider  amLODipine  (NORVASC ) 5 MG tablet Take 1 tablet (5 mg total) by mouth daily. 10/18/22   Towana Ozell BROCKS, MD  atorvastatin  (LIPITOR) 40 MG tablet Take 40 mg by mouth daily.    [provider]  cephALEXin (KEFLEX) 500 MG capsule Take 500 mg by mouth 4 (four) times daily. 10/30/23   [provider]  ibuprofen  (ADVIL ) 800 MG tablet Take 1 tablet (800 mg total) by mouth 3 (three) times daily. 10/17/23   Janit Thresa HERO, DPM  oxyCODONE -acetaminophen  (PERCOCET) 5-325 MG tablet Take 1 tablet by mouth every 6 (six) hours as needed for  severe pain (pain score 7-10). 11/01/23   Janit Thresa HERO, DPM  PROAIR  HFA 108 (90 Base) MCG/ACT inhaler 2 puffs every 4 (four) hours as needed. 06/10/20   [provider]    Social History Social History   Tobacco Use   Smoking status: Every Day    Current packs/day: 1.00    Types: Cigarettes   Smokeless tobacco: Never   Tobacco comments:    Smokes 1/2 to 3/4 pack per day 06/20/20  Vaping Use   Vaping status: Some Days  Substance Use Topics   Alcohol use: No   Drug use: Not Currently    Types: Marijuana    Comment: couple hits on bongper night--06/20/20    Review of Systems: Documented in HPI ____________________________________________  PHYSICAL EXAM: VITAL SIGNS: Triage: Blood pressure (!) 152/74, pulse 98, temperature 99.3 F (37.4 C), temperature source Oral, resp. rate 17, height 5' 3 (1.6 m), weight 54.4 kg, SpO2 94%.  Vitals:   12/04/23 0300 12/04/23 0315 12/04/23 0345 12/04/23 0630  BP: (!) 109/58 106/61 130/64   Pulse: 92 90 98   Resp: 17     Temp:      TempSrc:      SpO2: 94% 95% 93% 96%  Weight:      Height:        Physical Exam Vitals and nursing note reviewed.  Constitutional:      Appearance: She is well-developed.  HENT:     Head: Normocephalic and atraumatic.  Cardiovascular:     Rate and Rhythm: Normal rate and  regular rhythm.  Pulmonary:     Effort: No respiratory distress.     Breath sounds: No stridor.  Abdominal:     General: There is no distension.  Musculoskeletal:        General: No swelling or tenderness. Normal range of motion.     Cervical back: Normal range of motion.  Skin:    General: Skin is warm and dry.  Neurological:     General: No focal deficit present.     Mental Status: She is alert.       ____________________________________________   LABS (all labs ordered are listed, but only abnormal results are displayed)  Labs Reviewed  COMPREHENSIVE METABOLIC PANEL WITH GFR - Abnormal; Notable for the  following components:      Result Value   Potassium 3.3 (*)    BUN <5 (*)    Calcium  8.8 (*)    AST 13 (*)    Alkaline Phosphatase 169 (*)    All other components within normal limits  CBC WITH DIFFERENTIAL/PLATELET  I-STAT CHEM 8, ED   ____________________________________________  EKG   EKG Interpretation Date/Time:    Ventricular Rate:    PR Interval:    QRS Duration:    QT Interval:    QTC Calculation:   R Axis:      Text Interpretation:          ____________________________________________  RADIOLOGY  DG Chest Portable 1 View Result Date: 12/04/2023 EXAM: 1 VIEW(S) XRAY OF THE CHEST 12/04/2023 05:55:00 AM COMPARISON: AP radiograph of the chest dated 12/25/2022. CLINICAL HISTORY: eval for pneumonia FINDINGS: LUNGS AND PLEURA: No focal pulmonary opacity. No pleural effusion. No pneumothorax. HEART AND MEDIASTINUM: No acute abnormality of the cardiac and mediastinal silhouettes. BONES AND SOFT TISSUES: Cervical fixation hardware in place. No acute osseous abnormality. IMPRESSION: 1. No acute process. Electronically signed by: Evalene Coho MD 12/04/2023 06:05 AM EST RP Workstation: [id number]   ____________________________________________  PROCEDURES  Procedure(s) performed:   .Critical Care  Performed by: Lorette Mayo, MD Authorized by: Lorette Mayo, MD   Critical care provider statement:    Critical care time (minutes):  30   Critical care was necessary to treat or prevent imminent or life-threatening deterioration of the following conditions:  Respiratory failure   Critical care was time spent personally by me on the following activities:  Development of treatment plan with patient or surrogate, discussions with consultants, evaluation of patient's response to treatment, examination of patient, ordering and review of laboratory studies, ordering and review of radiographic studies, ordering and performing treatments and interventions, pulse oximetry,  re-evaluation of patient's condition and review of old charts  ____________________________________________  INITIAL IMPRESSION / ASSESSMENT AND PLAN   Clinical Course as of 12/04/23 0644  Wed Dec 04, 2023  0022 Initial Evaluation:  Patient presents with persistent cough, chest pain, and muscular pain in the neck and shoulders for several weeks, with difficulty sleeping due to congestion and cough, and has a history of COPD and asthma.  The differential diagnosis includes exacerbation of COPD, asthma exacerbation, viral upper respiratory infection, bacterial pneumonia, and musculoskeletal strain from coughing. Less likely considerations include pleurisy, bronchitis, and post-viral syndrome.  Plan:  Administer additional nebulizer treatments Consider prescription cough suppressant Consider muscle relaxer for muscular pain Advise follow-up with primary care physician or pulmonologist [JM]  0229 Sleeping comfortably. As this was one of her mian issues was coughing so bad she couldn't sleep, ill let her sleep awhile before discharge.  [JM]  Clinical Course User Index [JM] Mercedies Ganesh, Selinda, MD     Images ordered viewed and obtained by myself. Agree with Radiology interpretation. Details in ED course.  Labs ordered reviewed by myself as detailed in ED course.  Consultations obtained/considered detailed in ED course.    CRITICAL INTERVENTIONS:  Oxygen  for resp failure/copd exacerbation  Patient became hypoxic while sleeping.  We could not get her oxygen  above 90% even with long deep breaths.  She is very tachypneic.  Patient requiring 2 L of oxygen  to maintain sats above 90%.  She is observed for couple hours and still not able to come off oxygen .  Add on antibiotics, labs, chest x-ray, steroids and started on continuous albuterol .  Suspect likely COPD exacerbation with hypoxia but she does have asthma as well so it is somewhat hard to say.    FINAL IMPRESSION Final diagnoses:  Acute  respiratory failure with hypoxia [facility])     Disposition Medical screening exam was performed and I feel the patient has had appropriate emergency department evaluation and work-up for their chief complaint and is stable for ADMISSION to the hospital at this time.  I discussed with Dr. Shona with the [facility] service and discussed labs, imaging and other work-up in the emergency room.  They agree to admission for further management and work-up of said condition.   ____________________________________________   NEW OUTPATIENT MEDICATIONS STARTED DURING THIS VISIT:  New Prescriptions   No medications on file    Note:  This note was prepared with assistance of Dragon voice recognition software. Occasional wrong-word or sound-a-like substitutions may have occurred due to the inherent limitations of voice recognition software.    Garrell Flagg, Selinda, MD 12/04/23 [phone]

## 2023-12-04 NOTE — ED Notes (Signed)
 Per EDP pt placed on room air to see if able to tolerate. Pt's oxygen  saturation decreased from 98% on 2L [place] to 88% on room air. Pt placed back on 2L [place] increasing oxygen  saturation to 97%.

## 2023-12-04 NOTE — ED Notes (Signed)
 Pt's oxygen  saturation decreased to 80% on room air. Pt placed on 2L [place] increasing oxygen  saturation to 98%. EDP made aware. No new orders at this time.

## 2023-12-04 NOTE — Progress Notes (Signed)
 Transition of Care Department [facility]) has reviewed patient and no other TOC needs have been identified at this time. We will continue to monitor patient advancement through interdisciplinary progression rounds. If new patient transition needs arise, please place a TOC consult.   12/04/23 0932  TOC Brief Assessment  Insurance and Status Reviewed  Patient has primary care physician Yes  Home environment has been reviewed Lives with adult children.  Prior level of function: Independent.  Prior/Current Home Services No current home services  Social Drivers of Health Review SDOH reviewed no interventions necessary  Readmission risk has been reviewed Yes  Transition of care needs no transition of care needs at this time
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
## 2023-12-04 NOTE — Discharge Summary (Signed)
 Physician Discharge Summary  Alexandra [facility] FMW:[id number] DOB: [birth year] DOA: 12/03/2023  PCP: Teresa Jenkins Jansky, FNP  Admit date: 12/03/2023 Discharge date: 12/04/2023  PATIENT HAS DISCHARGED AGAINST MEDICAL ADVICE   Home Health: NA   Discharge Condition:  GUARDED CODE STATUS: FULL   Brief Hospitalization Summary: Please see all hospital notes, images, labs for full details of the hospitalization. 58 year old female smoker with COPD and emphysema, epilepsy, hyperlipidemia, hypertension, history of leaving hospital AMA, presented to the emergency department complaining of chest congestion, neck and shoulder pain for couple of weeks.  She had been treated at an urgent care yesterday and tested negative for COVID.  She was given oral prednisone  and antibiotics.  She complains of insomnia, nonproductive cough, wheezing and coughing and chest congestion.  She was found in the ED to have a new oxygen  requirement with desaturation to 80% on room air.  She was placed on 2 L nasal cannula.  She received aggressive treatments in the ED with IV steroids, oral antibiotics and continuous nebulizers.  Unfortunately they were not able to get her weaned off supplemental oxygen  she continued to have symptoms of shortness of breath coughing and wheezing and admission was requested for management of acute COPD exacerbation failing outpatient management.  Notified at 2:36 pm 12/04/23 that patient has left the hospital against medical advice.    Discharge Diagnoses:  Principal Problem:   Acute hypoxic respiratory failure (HCC) Active Problems:   COPD exacerbation (HCC)   MDD (major depressive disorder)   Anorexia nervosa (HCC)   Seizures (HCC)   Anxiety state   Tobacco use disorder   COPD (chronic obstructive pulmonary disease) (HCC)   Procedures/Studies: DG Chest Portable 1 View Result Date: 12/04/2023 EXAM: 1 VIEW(S) XRAY OF THE CHEST 12/04/2023 05:55:00 AM COMPARISON: AP radiograph of the chest  dated 12/25/2022. CLINICAL HISTORY: eval for pneumonia FINDINGS: LUNGS AND PLEURA: No focal pulmonary opacity. No pleural effusion. No pneumothorax. HEART AND MEDIASTINUM: No acute abnormality of the cardiac and mediastinal silhouettes. BONES AND SOFT TISSUES: Cervical fixation hardware in place. No acute osseous abnormality. IMPRESSION: 1. No acute process. Electronically signed by: Evalene Coho MD 12/04/2023 06:05 AM EST RP Workstation: [id number]   DG Foot Complete Left Result Date: 11/13/2023 Please see detailed radiograph report in office note.    Discharge Exam: Vitals:   12/04/23 0800 12/04/23 1239  BP: 109/75 (!) 100/55  Pulse: (!) 106 91  Resp:  17  Temp: 99 F (37.2 C)   SpO2: 96% 96%   Vitals:   12/04/23 0741 12/04/23 0742 12/04/23 0800 12/04/23 1239  BP:  (!) 114/51 109/75 (!) 100/55  Pulse: 87  (!) 106 91  Resp: 17 19  17   Temp:   99 F (37.2 C)   TempSrc:   Oral   SpO2: 95% 95% 96% 96%  Weight:   57.8 kg   Height:       The results of significant diagnostics from this hospitalization (including imaging, microbiology, ancillary and laboratory) are listed below for reference.     Microbiology: No results found for this or any previous visit (from the past 240 hours).   Labs: BNP (last 3 results) No results for input(s): BNP in the last 8760 hours. Basic Metabolic Panel: Recent Labs  Lab 12/04/23 0548 12/04/23 0555 12/04/23 0742  NA 142 143  --   K 3.3* 3.2*  --   CL 104 100  --   CO2 30  --   --   GLUCOSE 85  87  --   BUN <5* <3*  --   CREATININE 0.60 0.60  --   CALCIUM  8.8*  --   --   MG  --   --  1.8   Liver Function Tests: Recent Labs  Lab 12/04/23 0548  AST 13*  ALT 5  ALKPHOS 169*  BILITOT 0.3  PROT 6.7  ALBUMIN 4.1   No results for input(s): LIPASE, AMYLASE in the last 168 hours. No results for input(s): AMMONIA in the last 168 hours. CBC: Recent Labs  Lab 12/04/23 0548 12/04/23 0555  WBC 9.0  --   NEUTROABS 5.5  --    HGB 12.4 13.9  HCT 39.3 41.0  MCV 87.3  --   PLT 314  --    Cardiac Enzymes: No results for input(s): CKTOTAL, CKMB, CKMBINDEX, TROPONINI in the last 168 hours. BNP: Invalid input(s): POCBNP CBG: No results for input(s): GLUCAP in the last 168 hours. D-Dimer No results for input(s): DDIMER in the last 72 hours. Hgb A1c No results for input(s): HGBA1C in the last 72 hours. Lipid Profile No results for input(s): CHOL, HDL, LDLCALC, TRIG, CHOLHDL, LDLDIRECT in the last 72 hours. Thyroid function studies No results for input(s): TSH, T4TOTAL, T3FREE, THYROIDAB in the last 72 hours.  Invalid input(s): FREET3 Anemia work up No results for input(s): VITAMINB12, FOLATE, FERRITIN, TIBC, IRON, RETICCTPCT in the last 72 hours. Urinalysis    Component Value Date/Time   COLORURINE COLORLESS (A) 10/09/2022 2200   APPEARANCEUR CLEAR 10/09/2022 2200   LABSPEC 1.002 (L) 10/09/2022 2200   PHURINE 7.0 10/09/2022 2200   GLUCOSEU NEGATIVE 10/09/2022 2200   HGBUR MODERATE (A) 10/09/2022 2200   BILIRUBINUR NEGATIVE 10/09/2022 2200   KETONESUR NEGATIVE 10/09/2022 2200   PROTEINUR NEGATIVE 10/09/2022 2200   NITRITE NEGATIVE 10/09/2022 2200   LEUKOCYTESUR NEGATIVE 10/09/2022 2200   Sepsis Labs Recent Labs  Lab 12/04/23 0548  WBC 9.0   Microbiology No results found for this or any previous visit (from the past 240 hours).  Time coordinating discharge: 20 mins  SIGNED:  Afton Louder, MD  [facility] 12/04/2023, 2:36 PM How to contact the [facility] Attending or Consulting provider 7A - 7P or covering provider during after hours 7P -7A, for this patient?  Check the care team in [facility] and look for a) attending/consulting [facility] provider listed and b) the [facility] team listed Log into [email or web address] and use [place]'s universal password to access. If you do not have the password, please contact the hospital operator. Locate the [facility] provider  you are looking for under [facility] and page to a number that you can be directly reached. If you still have difficulty reaching the provider, please page the [facility] (Director on Call) for the Hospitalists listed on amion for assistance.

## 2023-12-06 ENCOUNTER — Emergency Department (HOSPITAL_COMMUNITY): Payer: MEDICAID

## 2023-12-06 ENCOUNTER — Other Ambulatory Visit: Payer: Self-pay

## 2023-12-06 ENCOUNTER — Emergency Department (HOSPITAL_COMMUNITY)
Admission: EM | Admit: 2023-12-06 | Discharge: 2023-12-06 | Disposition: A | Discharge status: Home / Self Care | Payer: MEDICAID | Attending: Emergency Medicine | Admitting: Emergency Medicine

## 2023-12-06 ENCOUNTER — Encounter (HOSPITAL_COMMUNITY): Payer: Self-pay

## 2023-12-06 DIAGNOSIS — Z79899 Other long term (current) drug therapy: Secondary | ICD-10-CM | POA: Diagnosis not present

## 2023-12-06 DIAGNOSIS — J441 Chronic obstructive pulmonary disease with (acute) exacerbation: Secondary | ICD-10-CM | POA: Diagnosis not present

## 2023-12-06 DIAGNOSIS — F17210 Nicotine dependence, cigarettes, uncomplicated: Secondary | ICD-10-CM | POA: Insufficient documentation

## 2023-12-06 DIAGNOSIS — Z91040 Latex allergy status: Secondary | ICD-10-CM | POA: Diagnosis not present

## 2023-12-06 DIAGNOSIS — R000 Tachycardia, unspecified: Secondary | ICD-10-CM | POA: Insufficient documentation

## 2023-12-06 DIAGNOSIS — R0602 Shortness of breath: Secondary | ICD-10-CM | POA: Diagnosis present

## 2023-12-06 LAB — TROPONIN T, HIGH SENSITIVITY
Troponin T High Sensitivity: <15 ng/L (ref 0–19)
Troponin T High Sensitivity: <15 ng/L (ref 0–19)

## 2023-12-06 LAB — COMPREHENSIVE METABOLIC PANEL WITH GFR
ALT: 6 U/L (ref 0–44)
AST: 16 U/L (ref 15–41)
Albumin: 3.9 g/dL (ref 3.5–5.0)
Alkaline Phosphatase: 151 U/L — ABNORMAL HIGH (ref 38–126)
Anion gap: 9 (ref 5–15)
BUN: 6 mg/dL (ref 6–20)
CO2: 30 mmol/L (ref 22–32)
Calcium: 8.6 mg/dL — ABNORMAL LOW (ref 8.9–10.3)
Chloride: 103 mmol/L (ref 98–111)
Creatinine, Ser: 0.67 mg/dL (ref 0.44–1.00)
GFR, Estimated: >60 mL/min (ref >60)
Glucose, Bld: 92 mg/dL (ref 70–99)
Potassium: 3.9 mmol/L (ref 3.5–5.1)
Sodium: 141 mmol/L (ref 135–145)
Total Bilirubin: <0.2 mg/dL (ref 0.0–1.2)
Total Protein: 6.6 g/dL (ref 6.5–8.1)

## 2023-12-06 LAB — BLOOD GAS, VENOUS
Acid-Base Excess: 5.5 mmol/L — ABNORMAL HIGH (ref 0.0–2.0)
Bicarbonate: 32.4 mmol/L — ABNORMAL HIGH (ref 20.0–28.0)
Drawn by: 7049
O2 Saturation: 62.9 %
Patient temperature: 36.7
pCO2, Ven: 55 mmHg (ref 44–60)
pH, Ven: 7.37 (ref 7.25–7.43)
pO2, Ven: <31 mmHg — CL (ref 32–45)

## 2023-12-06 LAB — CBC WITH DIFFERENTIAL/PLATELET
Abs Immature Granulocytes: 0.03 K/uL (ref 0.00–0.07)
Basophils Absolute: 0 K/uL (ref 0.0–0.1)
Basophils Relative: 0 %
Eosinophils Absolute: 0.1 K/uL (ref 0.0–0.5)
Eosinophils Relative: 1 %
HCT: 41.9 % (ref 36.0–46.0)
Hemoglobin: 13 g/dL (ref 12.0–15.0)
Immature Granulocytes: 0 %
Lymphocytes Relative: 29 %
Lymphs Abs: 3.2 K/uL (ref 0.7–4.0)
MCH: 27.4 pg (ref 26.0–34.0)
MCHC: 31 g/dL (ref 30.0–36.0)
MCV: 88.4 fL (ref 80.0–100.0)
Monocytes Absolute: 0.6 K/uL (ref 0.1–1.0)
Monocytes Relative: 5 %
Neutro Abs: 7 K/uL (ref 1.7–7.7)
Neutrophils Relative %: 65 %
Platelets: 346 K/uL (ref 150–400)
RBC: 4.74 MIL/uL (ref 3.87–5.11)
RDW: 13.9 % (ref 11.5–15.5)
WBC: 10.9 K/uL — ABNORMAL HIGH (ref 4.0–10.5)
nRBC: 0 % (ref 0.0–0.2)

## 2023-12-06 LAB — PRO BRAIN NATRIURETIC PEPTIDE: Pro Brain Natriuretic Peptide: 55.9 pg/mL (ref <300.0)

## 2023-12-06 MED ORDER — SODIUM CHLORIDE 0.9 % IV SOLN
1.0000 g | Freq: Once | INTRAVENOUS | Status: AC
  Administered 2023-12-06: 1 g via INTRAVENOUS
  Filled 2023-12-06: qty 10

## 2023-12-06 MED ORDER — ALBUTEROL SULFATE HFA 108 (90 BASE) MCG/ACT IN AERS: 2.0000 | INHALATION_SPRAY | RESPIRATORY_TRACT | 3 refills | Status: DC | PRN

## 2023-12-06 MED ORDER — PREDNISONE 20 MG PO TABS: 40.0000 mg | ORAL_TABLET | Freq: Every day | ORAL | 0 refills | Status: DC

## 2023-12-06 MED ORDER — DOXYCYCLINE HYCLATE 100 MG PO CAPS: 100.0000 mg | ORAL_CAPSULE | Freq: Two times a day (BID) | ORAL | 0 refills | Status: DC

## 2023-12-06 MED ORDER — METHYLPREDNISOLONE SODIUM SUCC 125 MG IJ SOLR
125.0000 mg | Freq: Once | INTRAMUSCULAR | Status: AC
  Administered 2023-12-06: 125 mg via INTRAVENOUS
  Filled 2023-12-06: qty 2

## 2023-12-06 MED ORDER — ALBUTEROL SULFATE (2.5 MG/3ML) 0.083% IN NEBU
10.0000 mg | INHALATION_SOLUTION | RESPIRATORY_TRACT | Status: AC
  Administered 2023-12-06: 10 mg via RESPIRATORY_TRACT
  Filled 2023-12-06 (×2): qty 12

## 2023-12-06 MED ORDER — DOXYCYCLINE HYCLATE 100 MG PO TABS
100.0000 mg | ORAL_TABLET | Freq: Once | ORAL | Status: AC
  Administered 2023-12-06: 100 mg via ORAL
  Filled 2023-12-06: qty 1

## 2023-12-06 NOTE — ED Notes (Signed)
 Pt placed on 2 L of O2 d/t SPO2 dropping to 87-88% while laying down.

## 2023-12-06 NOTE — Discharge Instructions (Addendum)
 Prednisone  is a steroid that helps to reduce certain types of inflammation and may be used for allergic reactions, some rashes such as poison ivy or dermatitis, for asthma attacks or bronchitis and for certain types of pain.  Please take this medicine exactly as prescribed - 40mg  by mouth daily for 5 days.  This can have certain side effects with some people including feeling like you can't sleep, feeling anxious or feeling like you are on a high.  It should not cause weight gain if only taken for a [place] time.  Please be aware that this medication may also cause an elevation in your blood sugar if you are a diabetic so if you are a diabetic you will need to keep a very close eye on your blood sugar, make sure that you are eating an extremely low level of carbohydrates and taking your medications exactly as prescribed.  If you should develop severe high blood sugar or start to feel poorly return to the emergency department immediately   Albuterol  is an inhaled medication which can help you to breathe better, you should take 2 puffs every 4 hours as needed, this may cause your heart to feel like it is racing, this should be a temporary side effect.  Doxycycline  is an antibiotic which is taken twice a day, this treats bacterial infections that can cause staph infections, it treats sinus infections and some pneumonia. It also treats tick infections like Lyme's disease.  In this case I would like for you to take the antibiotic exactly as prescribed until it is completed.  Please be aware that occasionally people will get a rash if they are in the sunlight for extended periods of time while taking this medicine.  It appears that you are having a flareup of your COPD, take the medications above, return for severe worsening symptoms

## 2023-12-06 NOTE — ED Triage Notes (Signed)
 Pt arrived via POV c/o recurrent SOB. Pt reports recent admission for COPD and reports O2 Sats with 79% on room air, however, Pt reports she signed out AMA to go home to cook food for her kids. Pt returned today due to on-going SOB. Pt is 95% O2 on room air in Triage. Pt reports she does have a strong smoker's cough at baseline.

## 2023-12-06 NOTE — ED Provider Notes (Signed)
 [place] EMERGENCY DEPARTMENT AT [facility] Provider Note   CSN: [id number] Arrival date & time: 12/06/23  8195     Patient presents with: Shortness of Breath   Alexandra Sanchez is a 58 y.o. female.    Shortness of Breath    This patient is a 58 year old female who admitted to the hospital approximately 3 days ago with shortness of breath and chest pain, admitted for COPD exacerbation and respiratory failure, she was ill-appearing and required admission to the hospital, she had also been seen because of shortness of breath in the outpatient setting as an office visit on 18 November, she had been seen in the emergency department for fatigue, 13 November, multiple emergency department visits over time for her COPD exacerbations, nausea and vomiting, constipation going back over the course of the year.  She continues to smoke cigarettes, approximately 1 pack/day.  She left AGAINST MEDICAL ADVICE from the hospital the day after she was admitted, it was the morning of the 26th, she was still [place] of breath, she was desaturating to 80% on room air, she was requiring oxygen  in the hospital.  The patient reports that she had to cook for her family on Thanksgiving which is why she left.  She comes back today because of having ongoing shortness of breath and chest pain.  She has albuterol  inhalers at home but has not had any steroids or antibiotics, denies fevers or chills and denies any sputum production or swelling of the legs.  Prior to Admission medications   Medication Sig Start Date End Date Taking? Authorizing Provider  albuterol  (VENTOLIN  HFA) 108 (90 Base) MCG/ACT inhaler Inhale 2 puffs into the lungs every 4 (four) hours as needed for wheezing or shortness of breath. 12/06/23  Yes Cleotilde Rogue, MD  doxycycline  (VIBRAMYCIN ) 100 MG capsule Take 1 capsule (100 mg total) by mouth 2 (two) times daily. 12/06/23  Yes Cleotilde Rogue, MD  predniSONE  (DELTASONE ) 20 MG tablet Take 2  tablets (40 mg total) by mouth daily. 12/06/23  Yes Cleotilde Rogue, MD  amLODipine  (NORVASC ) 5 MG tablet Take 1 tablet (5 mg total) by mouth daily. 10/18/22   Towana Ozell BROCKS, MD  atorvastatin  (LIPITOR) 40 MG tablet Take 40 mg by mouth daily.    [provider]  cephALEXin (KEFLEX) 500 MG capsule Take 500 mg by mouth 4 (four) times daily. 10/30/23   [provider]  ibuprofen  (ADVIL ) 800 MG tablet Take 1 tablet (800 mg total) by mouth 3 (three) times daily. 10/17/23   Janit Thresa HERO, DPM  ipratropium-albuterol  (DUONEB) 0.5-2.5 (3) MG/3ML SOLN Inhale 3 mLs into the lungs in the morning, at noon, in the evening, and at bedtime. 11/26/23   [provider]  oxyCODONE -acetaminophen  (PERCOCET) 5-325 MG tablet Take 1 tablet by mouth every 6 (six) hours as needed for severe pain (pain score 7-10). 11/01/23   Janit Thresa HERO, DPM    Allergies: Aspirin, Bee pollen, Haloperidol, Haloperidol lactate, Latex, Bee venom, and Erythromycin    Review of Systems  Respiratory:  Positive for shortness of breath.   All other systems reviewed and are negative.   Updated Vital Signs BP (!) 173/106 (BP Location: Left Arm)   Pulse (!) 114   Temp 98 F (36.7 C) (Oral)   Resp (!) 24   Ht 1.6 m (5' 3)   Wt 57.8 kg   SpO2 90%   BMI 22.57 kg/m   Physical Exam Vitals and nursing note reviewed.  Constitutional:  General: She is not in acute distress.    Appearance: She is well-developed. She is ill-appearing.  HENT:     Head: Normocephalic and atraumatic.     Mouth/Throat:     Pharynx: No oropharyngeal exudate.  Eyes:     General: No scleral icterus.       Right eye: No discharge.        Left eye: No discharge.     Conjunctiva/sclera: Conjunctivae normal.     Pupils: Pupils are equal, round, and reactive to light.  Neck:     Thyroid: No thyromegaly.     Vascular: No JVD.  Cardiovascular:     Rate and Rhythm: Regular rhythm. Tachycardia present.     Heart sounds: Normal  heart sounds. No murmur heard.    No friction rub. No gallop.  Pulmonary:     Effort: Respiratory distress present.     Breath sounds: Wheezing present. No rales.  Abdominal:     General: Bowel sounds are normal. There is no distension.     Palpations: Abdomen is soft. There is no mass.     Tenderness: There is no abdominal tenderness.  Musculoskeletal:        General: No tenderness. Normal range of motion.     Cervical back: Normal range of motion and neck supple.     Right lower leg: No edema.     Left lower leg: No edema.  Lymphadenopathy:     Cervical: No cervical adenopathy.  Skin:    General: Skin is warm and dry.     Findings: No erythema or rash.  Neurological:     Mental Status: She is alert.     Coordination: Coordination normal.  Psychiatric:        Behavior: Behavior normal.     (all labs ordered are listed, but only abnormal results are displayed) Labs Reviewed  CBC WITH DIFFERENTIAL/PLATELET - Abnormal; Notable for the following components:      Result Value   WBC 10.9 (*)    All other components within normal limits  COMPREHENSIVE METABOLIC PANEL WITH GFR - Abnormal; Notable for the following components:   Calcium  8.6 (*)    Alkaline Phosphatase 151 (*)    All other components within normal limits  BLOOD GAS, VENOUS - Abnormal; Notable for the following components:   pO2, Ven <31 (*)    Bicarbonate 32.4 (*)    Acid-Base Excess 5.5 (*)    All other components within normal limits  PRO BRAIN NATRIURETIC PEPTIDE  TROPONIN T, HIGH SENSITIVITY  TROPONIN T, HIGH SENSITIVITY    EKG: EKG Interpretation Date/Time:  Friday December 06 2023 18:11:58 EST Ventricular Rate:  97 PR Interval:  168 QRS Duration:  90 QT Interval:  330 QTC Calculation: 420 R Axis:   84  Text Interpretation: Sinus rhythm Baseline wander in lead(s) V1 V6 Confirmed by Cleotilde Rogue ([id number]) on 12/06/2023 6:50:35 PM  Radiology: DG Chest 2 View Result Date: 12/06/2023 EXAM: 2  VIEW(S) XRAY OF THE CHEST 12/06/2023 07:16:21 PM COMPARISON: 12/04/2023 CLINICAL HISTORY: SOB FINDINGS: LUNGS AND PLEURA: New linear opacity in right middle lobe. No pleural effusion. No pneumothorax. HEART AND MEDIASTINUM: No acute abnormality of the cardiac and mediastinal silhouettes. BONES AND SOFT TISSUES: Hardware in lower cervical spine partially visualized. No acute osseous abnormality. IMPRESSION: 1. New linear opacity in the right middle lobe, which may reflect atelectasis or early pneumonia. Recommend follow up in 8 - 10 weeks to ensure resolution. Electronically signed by: Norman  Stutzman MD 12/06/2023 07:22 PM EST RP Workstation: [id number]     Procedures   Medications Ordered in the ED  albuterol  (PROVENTIL ) (2.5 MG/3ML) 0.083% nebulizer solution 10 mg (10 mg Nebulization New Bag/Given 12/06/23 1847)  methylPREDNISolone  sodium succinate (SOLU-MEDROL ) 125 mg/2 mL injection 125 mg (125 mg Intravenous Given 12/06/23 1855)  cefTRIAXone (ROCEPHIN) 1 g in sodium chloride  0.9 % 100 mL IVPB (0 g Intravenous Stopped 12/06/23 1914)  doxycycline  (VIBRA -TABS) tablet 100 mg (100 mg Oral Given 12/06/23 1847)                                    Medical Decision Making Amount and/or Complexity of Data Reviewed Labs: ordered. Radiology: ordered.  Risk Prescription drug management.    This patient presents to the ED for concern of shortness of breath, this involves an extensive number of treatment options, and is a complaint that carries with it a high risk of complications and morbidity.  The differential diagnosis includes OPD exacerbation, CHF but that seems less likely, she has significant wheezing with history of COPD, oxygen  is in the low 90% range at rest   Co morbidities / Chronic conditions that complicate the patient evaluation  COPD, continues to smoke cigarettes   Additional history obtained:  Additional history obtained from EMR External records from outside source  obtained and reviewed including medical record including admission history from this last week   Lab Tests:  I Ordered, and personally interpreted labs.  The pertinent results include: Troponin undetectable, CBC without significant leukocytosis, metabolic panel is unremarkable, LFTs are unremarkable and BNP is measured at 56.  ABG without acidosis   Imaging Studies ordered:  I ordered imaging studies including chest x-ray I independently visualized and interpreted imaging which showed possible linear opacity in the right middle lobe, atelectasis or pneumonia I agree with the radiologist interpretation   Cardiac Monitoring: / EKG:  The patient was maintained on a cardiac monitor.  I personally viewed and interpreted the cardiac monitored which showed an underlying rhythm of: Mild sinus tachycardia   Problem List / ED Course / Critical interventions / Medication management  Patient appears well ambulated without any desaturations, VBG and other labs and x-ray are reassuring that this is probably just COPD exacerbation, home with doxycycline  prednisone  and albuterol  I ordered medication including albuterol  nebulizer treatment Reevaluation of the patient after these medicines showed that the patient improved I have reviewed the patients home medicines and have made adjustments as needed   Social Determinants of Health:  Tobacco use   Test / Admission - Considered:  Considered admission but patient improved and is stable for discharge      Final diagnoses:  COPD exacerbation [employer])    ED Discharge Orders          Ordered    predniSONE  (DELTASONE ) 20 MG tablet  Daily        12/06/23 2027    doxycycline  (VIBRAMYCIN ) 100 MG capsule  2 times daily        12/06/23 2027    albuterol  (VENTOLIN  HFA) 108 (90 Base) MCG/ACT inhaler  Every 4 hours PRN        12/06/23 2027               Cleotilde Rogue, MD 12/06/23 2028

## 2023-12-06 NOTE — ED Notes (Signed)
 Pt ambulated around ED, maintained O2 sat of 98%, denied weakness/Sob while ambulating

## 2023-12-18 NOTE — ED Provider Notes (Signed)
 [facility]  Emergency Department Provider Note     History   Chief Complaint Weakness, Cough, and Failure To Thrive    HPI  Alexandra Sanchez is a 58 y.o. female has multiple complaints some coughing some generalized weakness decreased appetite.  No fever or chills denies any shortness of breath no chest pain.  Has a history of anxiety disorder and depression in the past.  Patient is a smoker's she states.    Past Medical History: No date: Anxiety 07/29/2019: Chest pain No date: COPD (chronic obstructive pulmonary disease) (CMS-HCC) 07/29/2019: COPD with acute exacerbation    (CMS-HCC) No date: Depression 07/29/2019: Epilepsy    (CMS-HCC) No date: History of DVT (deep vein thrombosis) No date: Hyperlipidemia 07/29/2019: Hypoxemia 07/29/2019: Noncompliance with medications 07/29/2019: Pleuritic chest pain No date: Seizures    (CMS-HCC) 07/29/2019: Tobacco abuse disorder  Past Surgical History: No date: APPENDECTOMY No date: BACK SURGERY No date: CERVICAL SPINE SURGERY No date: CHOLECYSTECTOMY No date: HYSTERECTOMY No date: KNEE SURGERY; Right No date: TONSILLECTOMY  Prior to Admission medications  Medication Dose, Route, Frequency  albuterol  HFA 90 mcg/actuation inhaler 2 puffs, Inhalation, Every 4 hours PRN  amlodipine  (NORVASC ) 5 MG tablet 5 mg, Daily (standard)  atorvastatin  (LIPITOR) 40 MG tablet 40 mg, Daily (standard)  ipratropium-albuterol  (DUO-NEB) 0.5-2.5 mg/3 mL nebulizer 3 mL, Nebulization, 4 times a day  tiotropium (SPIRIVA WITH HANDIHALER) 18 mcg inhalation capsule 18 mcg, Inhalation, Daily (standard)    Allergies Aspirin, Bee pollen, Erythromycin, Haloperidol lactate, and Latex   [place] Social History[1]  Review of Systems  Constitutional:  Positive for malaise/fatigue.    As in HPI, all systems reviewed and otherwise negative.  Physical Exam    Vitals:   12/18/23 1306  BP: 134/95  Pulse: 102  Resp: 16  Temp: 36.4 C (97.5 F)   TempSrc: Temporal  SpO2: 92%  Weight: 58.1 kg (128 lb)  Height: 160 cm (5' 3)     Physical Exam  Constitutional: Patient appears well-developed and well nourished. Non toxic in appearance. HEENT: Unremarkable. Head: Atraumatic.  Eyes: Normal ocular movements. Neck: Supple with normal range of motion.  Pulmonary/Chest: Effort normal. No respiratory distress. Abdominal: Soft and non tender abdomen. Musculoskeletal: Extremities atraumatic. Neurological: Alert with no focal neurological deficit. Ambulatory with a steady gait. Skin: Warm and dry.  Nursing note and vital signs reviewed.   ED Course        Procedures  No orders of the defined types were placed in this encounter.   ED Results Results for orders placed or performed during the hospital encounter of 12/18/23  ECG 12 Lead  Result Value Ref Range   EKG Systolic BP  mmHg   EKG Diastolic BP  mmHg   EKG Ventricular Rate 101 BPM   EKG Atrial Rate 101 BPM   EKG P-R Interval 160 ms   EKG QRS Duration 92 ms   EKG Q-T Interval 332 ms   EKG QTC Calculation 430 ms   EKG Calculated P Axis 80 degrees   EKG Calculated R Axis 81 degrees   EKG Calculated T Axis 75 degrees   QTC Fredericia 395 ms  CBC w/ Differential  Result Value Ref Range   WBC 8.2 4.0 - 10.5 10*9/L   RBC 4.94 3.80 - 5.10 10*12/L   HGB 13.4 11.5 - 15.0 g/dL   HCT 58.3 65.9 - 55.9 %   MCV 84.2 80.0 - 98.0 fL   MCH 27.1 27.0 - 34.0 pg   MCHC  32.2 32.0 - 36.0 g/dL   RDW 85.7 88.4 - 85.4 %   MPV 9.3 7.4 - 10.4 fL   Platelet 331 140 - 415 10*9/L   Neutrophils % 60.5 %   Lymphocytes % 30.3 %   Monocytes % 6.5 %   Eosinophils % 2.1 %   Basophils % 0.4 %   Absolute Neutrophils 4.9 1.8 - 7.8 10*9/L   Absolute Lymphocytes 2.5 0.7 - 4.5 10*9/L   Absolute Monocytes 0.5 0.1 - 1.0 10*9/L   Absolute Eosinophils 0.2 0.0 - 0.4 10*9/L   Absolute Basophils 0.0 0.0 - 0.2 10*9/L    Radiology XR Chest 2 views Result Date: 12/18/2023 Exam:  Chest Two  Views  History:  Cough, weakness and decreased appetite. Syncopal episode 2 days ago.  Technique:  2 views  Comparison:  11/27/2023  Findings:  The cardiomediastinal silhouette and pulmonary vasculature appear normal. The lungs and pleural spaces are clear. The bony thorax is unremarkable.    No acute cardiopulmonary findings.    Signed (Electronic Signature): 12/18/2023 1:38 PM Signed By: Madelin Lamas    Medical Decision Making   I have reviewed the vital signs and the nursing notes. Labs and radiology results that were available during my care of the patient were independently reviewed by me and considered in my medical decision making.    This is a 58 year old COPD with multiple syncopal episodes associated with generalized weakness who denies any shortness of breath or chest pain.  States that she has had some coughing.  Decreased appetite.  Medical Decision Making Amount and/or Complexity of Data Reviewed Labs: ordered. Radiology: ordered. ECG/medicine tests: ordered.     Differential Diagnosis: Acute COPD exacerbation, acute bronchitis, community-acquired pneumonia     ED Clinical Impression   Final diagnoses:  None  Acute COPD exacerbation Syncope  Procedures      This record has been created using [employer]. Chart creation errors have been sought, but may not always have been located. Such creation errors do not reflect on the standard of medical care.       [1] Social History Tobacco Use   Smoking status: Every Day    Current packs/day: 0.50    Average packs/day: 0.5 packs/day for 41.0 years (20.5 ttl pk-yrs)    Types: Cigarettes    Passive exposure: Past   Smokeless tobacco: Never  Vaping Use   Vaping status: Never Used  Substance Use Topics   Alcohol use: Never   Drug use: Not Currently    Frequency: 7.0 times per week    Types: Marijuana   Maree Jonelle Lash, MD 12/18/23 1540

## 2023-12-18 NOTE — ED Triage Notes (Addendum)
 Feeling weak,coughing and poor appetite x2 weeks.States she passed out x2 days ago.Right shoulder pain.

## 2023-12-18 NOTE — Nursing Note (Signed)
 Patient meal tray delivered. Patient refused meal. Patient states she is not hungry. Meal tray left within reach at bedside. Call bell in reach. Patient denies further needs at this time.

## 2023-12-18 NOTE — H&P (Signed)
 History and Physical [facility]   12/18/23    Patient name: Alexandra Sanchez. Spicher DOB Dec 25, 1965 MRN#: [id number] PCP: Spencer, Lorenzo Johnathan, MD Time: 4:22 PM Primary Care Provider:  Jacques Arlon Goodell, MD Inpatient primary attending provider: Elspeth Blane Kay, MD  _________________________________________________________________________  Admission HPI   Patient admitted on: 12/18/2023  1:17 PM  Patient admitted by: Elspeth Blane Kay, MD   CHIEF COMPLAINT: Syncope  Day of admission HPI:  [name]  is a 58 y.o. female with a PMH significant for COPD who presented with syncope.  Patient has felt Jeanty of breath with cough and wheezing for the last 2 weeks.  She is a 1 pack/day smoker.  Today she had a syncopal event and came to the emergency room.  She was found to be hypoxic and in respiratory distress.  Arterial blood gas shows mild hypercapnia and significant hypoxia so she was presented to the hospitalist service for further evaluation  Patient admitted on Home O2? - no Patient on home anticoagulant? -  no Patient admitted with Chronic home foley catheter? - no Foley catheter placed or replaced by another service prior to admission? - no Central Line Status: NONE  Mental Status on Admission: The patient is Alert and oriented to PERSON The patient is Alert And oriented to TIME The patient is Alert and oriented to LOCATION  Problem List, Assessment & Plan    ASSESSMENT & PLAN (In order of descending acuity)  Acute exacerbation of COPD Syncope Continuous tobacco dependence Acute on chronic respiratory failure with hypoxia and hypercapnia  Admit to inpatient DuoNebs 4 times per day Prednisone , azithromycin/ceftriaxone  Encouraged tobacco cessation Obtain 2D Echo given syncope and prior documented aortic stenosis   Incidental Findings for ourpatient Follow-Up: No significant incidental findings present   ADDITIONAL NON-ACUTE  FINDINGS, OBSERVATIONS, FAMILY DISCUSSIONS, ETC. (When present):  Physical Exam Vitals reviewed.  Constitutional:      Appearance: Normal appearance.  HENT:     Head: Normocephalic.     Mouth/Throat:     Mouth: Mucous membranes are moist.     Pharynx: Oropharynx is clear.  Eyes:     Extraocular Movements: Extraocular movements intact.  Cardiovascular:     Rate and Rhythm: Regular rhythm. Tachycardia present.     Heart sounds: No murmur heard. Pulmonary:     Effort: Accessory muscle usage, prolonged expiration and respiratory distress present.     Breath sounds: Wheezing present.  Abdominal:     General: Abdomen is flat. There is no distension.  Musculoskeletal:        General: Normal range of motion.     Cervical back: Neck supple.  Skin:    General: Skin is warm and dry.     Capillary Refill: Capillary refill takes less than 2 seconds.  Neurological:     General: No focal deficit present.     Mental Status: She is alert and oriented to person, place, and time.  Psychiatric:        Mood and Affect: Mood normal.        Behavior: Behavior normal.      DVT Prophylaxis Ordered: SQ Enoxaparin  __________________________________________________________________________  Temp:  [36.4 C (97.5 F)] 36.4 C (97.5 F) Pulse:  [102-104] 104 SpO2 Pulse:  [93-95] 95 Resp:  [16-20] 20 BP: (119-134)/(61-95) 119/61 FiO2 (%):  [21 %-28 %] 28 % SpO2:  [80 %-94 %] 94 % Body mass index is 22.67 kg/m. Intake/Output last 3 shifts: No intake/output data recorded.  Consults Requested  None     In hospital Nutrition: No diet orders on file    An advanced care planning discussion was not  had with patient and/or patient's decisions maker (documented separately).  CODE STATUS :                    No Order   Given this patient's known comorbid illnesses and condition Present on Admission, plan of care includes acute interventions of oxygen  supplementation IV antibiotics as noted in  the Problem List, Assessment & Plan noted above.  I fully expect, with this information in hand, that this patient will require at least two medically necessary midnights of hospital care prior to a safe discharge.   The patient's hospital stay is complicated by the above clinically significant conditions, as documented in Assessment and Plan, which are present on admission and requiring additional evaluation and treatment or having a significant effect of this patient's care.   __________________________________________________________  Allergies  Allergen Reactions   Aspirin Anaphylaxis, Shortness Of Breath and Swelling    swelling Tongue swelling  Swelling of tongue and throat Tongue swelling  Tongue swelling  Tongue swelling  Swelling of tongue and throat    Bee Pollen Other (See Comments)    Other reaction(s): Other (See Comments) STOPS BREATHING STOPS BREATHING STOPS BREATHING STOPS BREATHING    Erythromycin Hives    Stomach cramps    Haloperidol Lactate Shortness Of Breath   Latex Shortness Of Breath and Swelling    Swelling       Past Medical History[1]  Past Surgical History[2]   Family History[3]       Current Medications[4]  REFER TO EPIC FOR FULL LIST OF CURRENT MEDICATIONS ORDERED ON ADMISSION. THESE ORDERS APPEAR ONLY WHEN RELEASED, WHICH MAY HAPPEN AFTER ADMISSION ONCE PATIENT IS TRANSFERRED FROM THE ED.  Allergies  Allergies[5]  Imaging  CTA Chest W Contrast Result Date: 12/18/2023 Exam:  CT Angiogram Chest (Pulmonary Embolism Protocol)  History:  Shortness of breath. Cough. Syncopal episodes. Failure to thrive .  Technique: Chest CTA with IV contrast (pulmonary embolism protocol). This examination was specifically tailored to evaluate the pulmonary arteries for the presence of intraluminal thrombus.  Volumetric axial CT Maximum Intensity Projection (MIP) pulmonary angiographic images were generated at the scanner and sent to PACS for review. AEC  (automated exposure control) and/or manual techniques such as size-specific kV and mAs are employed where appropriate to reduce radiation exposure for all CT exams.  Comparison:  Chest two views 12/18/2023 and 11/27/2023; CT chest 07/29/2019  Chest CT Findings:  PULMONARY ARTERIES: The main pulmonary artery is opacified up to 413 Hounsfield units. No acute pulmonary embolism. The main pulmonary artery is normal in caliber.  LUNGS: Central airways patent. Background parenchyma normal. No groundglass or consolidation. No suspicious pulmonary nodules.  PLEURA: No pleural effusion or pneumothorax.  HEART: Normal heart size. No pericardial effusion. Mild coronary artery calcifications.  AORTA: No acute pathology. Normal caliber. Mild atherosclerosis.  MEDIASTINUM: No lymphadenopathy. The esophagus follows a normal course of normal caliber.  UPPER ABDOMEN: Limited view of the upper abdomen is unremarkable.  SOFT TISSUES: Normal.  BONES: Mild dextrocurvature of the mid to upper thoracic spine. Redemonstration of C6-7 ACDF hardware. Mild-to-moderate multilevel degenerative disc changes of the upper thoracic spine.     1.    No acute pulmonary embolism. 2.    No acute pulmonary process. 3.    Mild coronary artery and thoracic aorta atherosclerotic calcifications.  Signed (  Electronic Signature): 12/18/2023 4:14 PM Signed By: Tanda Lyons  CT Head Wo Contrast Result Date: 12/18/2023 Exam:  CT Head without Contrast  History:  Syncope  Technique: Routine brain CT without IV contrast. AEC (automated exposure control) and/or manual techniques such as size-specific kV and mAs are employed where appropriate to reduce radiation exposure for all CT exams.  Comparison:  Head CT dated 12/25/2022  Findings:   BRAIN:  No CT evidence of acute infarction, hemorrhage, edema, mass or mass effect. Ventricles and basilar cisterns are unremarkable.  SOFT TISSUES:  Negative. CALVARIUM:  Negative. No fracture. SINUSES AND MASTOIDS:  No  mucosal thickening or fluid.     - No acute intracranial process.  Signed (Electronic Signature): 12/18/2023 2:07 PM Signed By: Reyes Going, MD  XR Chest 2 views Result Date: 12/18/2023 Exam:  Chest Two Views  History:  Cough, weakness and decreased appetite. Syncopal episode 2 days ago.  Technique:  2 views  Comparison:  11/27/2023  Findings:  The cardiomediastinal silhouette and pulmonary vasculature appear normal. The lungs and pleural spaces are clear. The bony thorax is unremarkable.    No acute cardiopulmonary findings.    Signed (Electronic Signature): 12/18/2023 1:38 PM Signed By: Madelin Lamas   Lab Results   Recent Labs    12/18/23 1325  WBC 8.2  HGB 13.4  HCT 41.6  PLT 331   Recent Labs    12/18/23 1325  NA 142  K 3.4*  CL 104  CO2 27.7  BUN 5*  CREATININE 0.94  GLU 137  CALCIUM  8.8  ALBUMIN 3.6  PROT 7.4  BILITOT 0.5  AST 14*  ALT 11*  ALKPHOS 221*  LIPASE 49  LACTATE 1.6   Recent Labs    12/18/23 1439  TROPONINI 9   Recent Labs    12/18/23 1358  WBCUA <1  NITRITE Negative  LEUKOCYTESUR Negative  BACTERIA None Seen  RBCUA 1  BLOODU Small*  GLUCOSEU Negative  PROTEINUA Negative  KETONESU Negative   Recent Labs    12/18/23 1358 12/18/23 1409  OPIAU Negative  --   BENZU Negative  --   AMPHU Negative  --   COCAU Negative  --   CANNAU Positive*  --   BARBU Negative  --   ETOH  --  <3   No results for input(s): PREGTESTUR, PREGPOC in the last 72 hours. No results for input(s): OCCULTBLD, RAPSCRN, CDIFRPCR, CDIFFNAP1, A1C, CHOL, LDL, HDL, TRIG in the last 72 hours. Recent Labs    12/18/23 1521  PHART 7.30*  PCO2ART 59.4*  PO2ART 53*  HCO3ART 28.9*  O2SATART 86.7*  BEART 1.2     Home Medications   Prior to Admission medications  Medication Dose, Route, Frequency  albuterol  HFA 90 mcg/actuation inhaler 2 puffs, Inhalation, Every 4 hours PRN  amlodipine  (NORVASC ) 5 MG tablet 5 mg, Daily (standard)   atorvastatin  (LIPITOR) 40 MG tablet 40 mg, Daily (standard)  ipratropium-albuterol  (DUO-NEB) 0.5-2.5 mg/3 mL nebulizer 3 mL, Nebulization, 4 times a day  tiotropium (SPIRIVA WITH HANDIHALER) 18 mcg inhalation capsule 18 mcg, Inhalation, Daily (standard)   Elspeth GORMAN Kay, MD Hospitalist, [facility] 12/18/23, 4:22 PM       [1] Past Medical History: Diagnosis Date   Anxiety    Chest pain 07/29/2019   COPD (chronic obstructive pulmonary disease) (CMS-HCC)    COPD with acute exacerbation    (CMS-HCC) 07/29/2019   Depression    Epilepsy    (CMS-HCC) 07/29/2019   History of DVT (deep vein  thrombosis)    Hyperlipidemia    Hypoxemia 07/29/2019   Noncompliance with medications 07/29/2019   Pleuritic chest pain 07/29/2019   Seizures    (CMS-HCC)    Tobacco abuse disorder 07/29/2019  [2] Past Surgical History: Procedure Laterality Date   APPENDECTOMY     BACK SURGERY     CERVICAL SPINE SURGERY     CHOLECYSTECTOMY     HYSTERECTOMY     KNEE SURGERY Right    TONSILLECTOMY    [3] Family History Problem Relation Age of Onset   Diabetes Mother    Depression Mother    Diabetes Father    Depression Father    Breast cancer Maternal Aunt   [4]  Current Facility-Administered Medications:    albuterol  2.5 mg /3 mL (0.083 %) nebulizer solution, 15 mg/hr, Nebulization, Continuous, Maree Jonelle Lash, MD, Last Rate: 18 mL/hr at 12/18/23 1555, 15 mg/hr at 12/18/23 1555   magnesium  sulfate in D5W 1 gram/100 mL infusion 1 g, 1 g, Intravenous, Q1H, Maree Jonelle Lash, MD, 1 g at 12/18/23 1605   sodium chloride  0.9% (NS) bolus 1,000 mL, 1,000 mL, Intravenous, Once, Maree Jonelle Lash, MD, Last Rate: 1,000 mL/hr at 12/18/23 1606, 1,000 mL at 12/18/23 1606  Current Outpatient Medications:    albuterol  HFA 90 mcg/actuation inhaler, Inhale 2 puffs every four (4) hours as needed for wheezing., Disp: 18 g, Rfl: 2   amlodipine  (NORVASC ) 5 MG tablet, Take 1 tablet (5 mg total) by mouth  daily., Disp: , Rfl:    atorvastatin  (LIPITOR) 40 MG tablet, Take 1 tablet (40 mg total) by mouth daily., Disp: , Rfl:    ipratropium-albuterol  (DUO-NEB) 0.5-2.5 mg/3 mL nebulizer, Inhale 3 mL by nebulization four (4) times a day., Disp: 48 mL, Rfl: 0   tiotropium (SPIRIVA WITH HANDIHALER) 18 mcg inhalation capsule, Place 1 capsule (18 mcg total) into inhaler and inhale daily., Disp: 30 capsule, Rfl: 2 [5] Allergies Allergen Reactions   Aspirin Anaphylaxis, Shortness Of Breath and Swelling    swelling Tongue swelling  Swelling of tongue and throat Tongue swelling  Tongue swelling  Tongue swelling  Swelling of tongue and throat    Bee Pollen Other (See Comments)    Other reaction(s): Other (See Comments) STOPS BREATHING STOPS BREATHING STOPS BREATHING STOPS BREATHING    Erythromycin Hives    Stomach cramps    Haloperidol Lactate Shortness Of Breath   Latex Shortness Of Breath and Swelling    Swelling

## 2023-12-18 NOTE — Nursing Note (Addendum)
 The patient left AMA at this time. She said she had a family emergency and there was no talking her out of it. I removed her IV, she got dressed, signed the AMA paperwork, grabbed all of her belongings, and left without any issues. She was A/O x 4 and ambulates great on RA. I let Dr. Florie know.

## 2023-12-19 NOTE — Care Plan (Signed)
 Transition of Care Encounter Data   Call attempt: 1 Admission date: 12/18/23 Discharge date: 12/18/23 Discharge diagnosis: Left AMA, syncope, COPD Patient post discharge: Medications:         [facility]: [phone] [facility]: [phone]:    Other: Contact PCP:       [facility] TRANSITIONS OF CARE SUMMARY NOTE   Attempted to contact pt today at Cell to complete Transitions of Care call for the [facility]. Left message to return call.; 1st attempt  Hadassah KATHEE Core, RN

## 2023-12-19 NOTE — Telephone Encounter (Signed)
 Upcoming Appt: No future appointments.  Disposition: Call EMS 911 Now  Triage documentation reviewed and Disposition present? yes  Encounter Reason for Disposition:   Difficulty breathing   Is this a pediatric patient?  No   Any recent, relevant visit?  Yes   Date/location of recent visit? 12/10 ICU, left AMA  Any related medications? Did not ask  Any relevant medical history?  Did not ask  Any interventions?  Did not ask   [facility] Eligibility  Patient is eligible for [facility] (according to most recent zip code and insurance information)?  No  Patient is [facility] eligible and appropriate for referral: No   Encounter Initial Assessment: 1. ONSET: How long were you unconscious? (e.g., minutes, seconds) When did it happen?     Passed out, this AM, unsure how long was unconscious, left ICU AMA last night, had to drive son to job interview, coughing while on the call with RN, has difficulties breathing from 2. CONTENT: What happened during the period of unconsciousness? (e.g., seizure activity)      Had 2 seizures today 3. MENTAL STATUS: Alert and oriented now? (e.g., oriented x 3 = name, month, location)      A/O x3 now 4. TRIGGER: What do you think caused the fainting? What were you doing just before you fainted?  (e.g., exercise, sudden standing up, prolonged standing)     Too high acuity to ask, ask pt. to call 911 5. RECURRENT SYMPTOM: Have you ever passed out before? If Yes, ask: When was the last time? and What happened that time?      To high acuity to ask 6. INJURY: Did you hurt yourself when you fell?      Too high acuity to ask 7. CARDIAC SYMPTOMS: Have you had any of the following symptoms: chest pain, difficulty breathing, palpitations?     Did not ask 8. NEUROLOGIC SYMPTOMS: Have you had any of the following symptoms: headache, numbness, vertigo, weakness?     Did not ask 9. GI SYMPTOMS: Have you had any  of the following symptoms: abdomen pain, vomiting, diarrhea, blood in stools?     Did not ask 10. OTHER SYMPTOMS: Do you have any other symptoms?       Did not ask 11. PREGNANCY: Is there any chance you are pregnant? When was your last menstrual period?       Did not ask   Encounter Protocols Used: Fainting-A-AH

## 2023-12-20 ENCOUNTER — Emergency Department (HOSPITAL_COMMUNITY): Payer: MEDICAID

## 2023-12-20 ENCOUNTER — Other Ambulatory Visit: Payer: Self-pay

## 2023-12-20 ENCOUNTER — Observation Stay (HOSPITAL_COMMUNITY)
Admission: EM | Admit: 2023-12-20 | Discharge: 2023-12-21 | DRG: 189 | Disposition: A | Payer: MEDICAID | Attending: Family Medicine | Admitting: Family Medicine

## 2023-12-20 ENCOUNTER — Encounter (HOSPITAL_COMMUNITY): Payer: Self-pay | Admitting: *Deleted

## 2023-12-20 DIAGNOSIS — J9621 Acute and chronic respiratory failure with hypoxia: Secondary | ICD-10-CM | POA: Diagnosis present

## 2023-12-20 DIAGNOSIS — J441 Chronic obstructive pulmonary disease with (acute) exacerbation: Principal | ICD-10-CM

## 2023-12-20 DIAGNOSIS — Z91148 Patient's other noncompliance with medication regimen for other reason: Secondary | ICD-10-CM

## 2023-12-20 DIAGNOSIS — E876 Hypokalemia: Secondary | ICD-10-CM

## 2023-12-20 DIAGNOSIS — J9602 Acute respiratory failure with hypercapnia: Secondary | ICD-10-CM

## 2023-12-20 DIAGNOSIS — R569 Unspecified convulsions: Secondary | ICD-10-CM

## 2023-12-20 DIAGNOSIS — F1910 Other psychoactive substance abuse, uncomplicated: Secondary | ICD-10-CM

## 2023-12-20 DIAGNOSIS — J9601 Acute respiratory failure with hypoxia: Secondary | ICD-10-CM

## 2023-12-20 DIAGNOSIS — R55 Syncope and collapse: Secondary | ICD-10-CM

## 2023-12-20 LAB — BLOOD GAS, VENOUS
Acid-Base Excess: 5 mmol/L — ABNORMAL HIGH (ref 0.0–2.0)
Bicarbonate: 31 mmol/L — ABNORMAL HIGH (ref 20.0–28.0)
Drawn by: 1517
O2 Saturation: 60.7 %
Patient temperature: 36.7
pCO2, Ven: 49 mmHg (ref 44–60)
pH, Ven: 7.4 (ref 7.25–7.43)
pO2, Ven: <31 mmHg — CL (ref 32–45)

## 2023-12-20 LAB — BASIC METABOLIC PANEL WITH GFR
Anion gap: 13 (ref 5–15)
BUN: 9 mg/dL (ref 6–20)
CO2: 25 mmol/L (ref 22–32)
Calcium: 9 mg/dL (ref 8.9–10.3)
Chloride: 104 mmol/L (ref 98–111)
Creatinine, Ser: 0.68 mg/dL (ref 0.44–1.00)
GFR, Estimated: >60 mL/min (ref >60)
Glucose, Bld: 87 mg/dL (ref 70–99)
Potassium: 3.9 mmol/L (ref 3.5–5.1)
Sodium: 141 mmol/L (ref 135–145)

## 2023-12-20 LAB — TROPONIN T, HIGH SENSITIVITY
Troponin T High Sensitivity: <15 ng/L (ref 0–19)
Troponin T High Sensitivity: <15 ng/L (ref 0–19)

## 2023-12-20 LAB — CBC
HCT: 45.4 % (ref 36.0–46.0)
Hemoglobin: 14.2 g/dL (ref 12.0–15.0)
MCH: 27.3 pg (ref 26.0–34.0)
MCHC: 31.3 g/dL (ref 30.0–36.0)
MCV: 87.1 fL (ref 80.0–100.0)
Platelets: 279 K/uL (ref 150–400)
RBC: 5.21 MIL/uL — ABNORMAL HIGH (ref 3.87–5.11)
RDW: 14.6 % (ref 11.5–15.5)
WBC: 11 K/uL — ABNORMAL HIGH (ref 4.0–10.5)
nRBC: 0 % (ref 0.0–0.2)

## 2023-12-20 LAB — RESP PANEL BY RT-PCR (RSV, FLU A&B, COVID)  RVPGX2
Influenza A by PCR: NEGATIVE
Influenza B by PCR: NEGATIVE
Resp Syncytial Virus by PCR: NEGATIVE
SARS Coronavirus 2 by RT PCR: NEGATIVE

## 2023-12-20 LAB — PRO BRAIN NATRIURETIC PEPTIDE: Pro Brain Natriuretic Peptide: <50 pg/mL (ref <300.0)

## 2023-12-20 MED ORDER — METHYLPREDNISOLONE SODIUM SUCC 125 MG IJ SOLR
125.0000 mg | Freq: Once | INTRAMUSCULAR | Status: AC
  Administered 2023-12-20: 125 mg via INTRAVENOUS
  Filled 2023-12-20: qty 2

## 2023-12-20 MED ORDER — PANTOPRAZOLE SODIUM 40 MG PO TBEC
40.0000 mg | DELAYED_RELEASE_TABLET | Freq: Every day | ORAL | Status: DC
  Administered 2023-12-21: 40 mg via ORAL
  Filled 2023-12-20: qty 1

## 2023-12-20 MED ORDER — GUAIFENESIN-DM 100-10 MG/5ML PO SYRP: 5.0000 mL | ORAL_SOLUTION | ORAL | Status: DC | PRN

## 2023-12-20 MED ORDER — IPRATROPIUM-ALBUTEROL 0.5-2.5 (3) MG/3ML IN SOLN
3.0000 mL | Freq: Four times a day (QID) | RESPIRATORY_TRACT | Status: DC
  Administered 2023-12-20 – 2023-12-21 (×4): 3 mL via RESPIRATORY_TRACT
  Filled 2023-12-20 (×4): qty 3

## 2023-12-20 MED ORDER — MAGNESIUM SULFATE 2 GM/50ML IV SOLN
2.0000 g | Freq: Once | INTRAVENOUS | Status: AC
  Administered 2023-12-20: 2 g via INTRAVENOUS
  Filled 2023-12-20: qty 50

## 2023-12-20 MED ORDER — POTASSIUM CHLORIDE CRYS ER 20 MEQ PO TBCR
40.0000 meq | EXTENDED_RELEASE_TABLET | Freq: Once | ORAL | Status: AC
  Administered 2023-12-20: 40 meq via ORAL
  Filled 2023-12-20: qty 2

## 2023-12-20 MED ORDER — METHYLPREDNISOLONE SODIUM SUCC 40 MG IJ SOLR
40.0000 mg | Freq: Two times a day (BID) | INTRAMUSCULAR | Status: DC
  Administered 2023-12-21: 40 mg via INTRAVENOUS
  Filled 2023-12-20: qty 1

## 2023-12-20 MED ORDER — LEVETIRACETAM (KEPPRA) 500 MG/5 ML ADULT IV PUSH
1000.0000 mg | Freq: Once | INTRAVENOUS | Status: AC
  Administered 2023-12-20: 1000 mg via INTRAVENOUS
  Filled 2023-12-20: qty 10

## 2023-12-20 MED ORDER — IPRATROPIUM-ALBUTEROL 0.5-2.5 (3) MG/3ML IN SOLN: 3.0000 mL | RESPIRATORY_TRACT | Status: DC | PRN

## 2023-12-20 MED ORDER — SODIUM CHLORIDE 0.9 % IV SOLN
100.0000 mg | Freq: Two times a day (BID) | INTRAVENOUS | Status: DC
  Administered 2023-12-20 – 2023-12-21 (×2): 100 mg via INTRAVENOUS
  Filled 2023-12-20 (×6): qty 100

## 2023-12-20 MED ORDER — IPRATROPIUM-ALBUTEROL 0.5-2.5 (3) MG/3ML IN SOLN
3.0000 mL | Freq: Once | RESPIRATORY_TRACT | Status: AC
  Administered 2023-12-20: 3 mL via RESPIRATORY_TRACT
  Filled 2023-12-20: qty 3

## 2023-12-20 MED ADMIN — Dextromethorphan-Guaifenesin Tab ER 12HR 30-600 MG: 1 | ORAL | NDC 63824005634

## 2023-12-20 MED FILL — Dextromethorphan-Guaifenesin Tab ER 12HR 30-600 MG: 1.0000 | ORAL | Qty: 1 | Status: AC

## 2023-12-20 NOTE — ED Notes (Signed)
 Lab tech reports that pt may have had a seizure. Reports generalized shaking and grunting breathing. At this time, pt appears post-ictal. Does not respond to voice, or stimuli.

## 2023-12-20 NOTE — ED Notes (Addendum)
 Witnessed seizure like activity lasting appproximately 4 seconds, generalize tremors, HR increased to 110, respiration slowed to less then 6 breaths per minute. Post-ictal period lasted less than 90 seconds. Seizure pads installed on bed rails.

## 2023-12-20 NOTE — ED Notes (Signed)
 Pt lying down, O2 sat dropped to 88%. Placed pt on 2L [place]. O2 returned to 94%

## 2023-12-20 NOTE — ED Notes (Signed)
Pt ambulated to the bathroom and back to room
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
## 2023-12-20 NOTE — Progress Notes (Signed)
 Patient given Incentive Spirometer and instructed on usage.  Patient was able to achieve X10.  IS placed in patient's treatment bag to go up with her when she gets her room assignment.
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
## 2023-12-20 NOTE — ED Triage Notes (Addendum)
 Pt was admitted to ICU at [facility] on [facility] and pt left AMA the other night. Pt with continued SOB-hx of COPD. Denies home O2.  Pt states she passed out and had a seizure last night, slept all day yesterday. NP cough noted in triage. + HA

## 2023-12-21 ENCOUNTER — Encounter (HOSPITAL_COMMUNITY): Payer: Self-pay | Admitting: Internal Medicine

## 2023-12-21 ENCOUNTER — Inpatient Hospital Stay (HOSPITAL_COMMUNITY)
Admission: EM | Admit: 2023-12-21 | Discharge: 2023-12-22 | DRG: 101 | Discharge status: Against Medical Advice | Payer: MEDICAID | Attending: Family Medicine | Admitting: Family Medicine

## 2023-12-21 ENCOUNTER — Observation Stay (HOSPITAL_COMMUNITY): Payer: MEDICAID

## 2023-12-21 ENCOUNTER — Emergency Department (HOSPITAL_COMMUNITY): Payer: MEDICAID

## 2023-12-21 DIAGNOSIS — Z9049 Acquired absence of other specified parts of digestive tract: Secondary | ICD-10-CM | POA: Diagnosis not present

## 2023-12-21 DIAGNOSIS — J441 Chronic obstructive pulmonary disease with (acute) exacerbation: Secondary | ICD-10-CM | POA: Diagnosis present

## 2023-12-21 DIAGNOSIS — E785 Hyperlipidemia, unspecified: Secondary | ICD-10-CM | POA: Diagnosis present

## 2023-12-21 DIAGNOSIS — E876 Hypokalemia: Secondary | ICD-10-CM | POA: Diagnosis present

## 2023-12-21 DIAGNOSIS — K219 Gastro-esophageal reflux disease without esophagitis: Secondary | ICD-10-CM | POA: Diagnosis present

## 2023-12-21 DIAGNOSIS — R55 Syncope and collapse: Secondary | ICD-10-CM | POA: Diagnosis not present

## 2023-12-21 DIAGNOSIS — I10 Essential (primary) hypertension: Secondary | ICD-10-CM | POA: Insufficient documentation

## 2023-12-21 DIAGNOSIS — Z886 Allergy status to analgesic agent status: Secondary | ICD-10-CM | POA: Diagnosis not present

## 2023-12-21 DIAGNOSIS — J449 Chronic obstructive pulmonary disease, unspecified: Secondary | ICD-10-CM | POA: Diagnosis not present

## 2023-12-21 DIAGNOSIS — Z83438 Family history of other disorder of lipoprotein metabolism and other lipidemia: Secondary | ICD-10-CM | POA: Diagnosis not present

## 2023-12-21 DIAGNOSIS — R569 Unspecified convulsions: Principal | ICD-10-CM

## 2023-12-21 DIAGNOSIS — Z91040 Latex allergy status: Secondary | ICD-10-CM | POA: Diagnosis not present

## 2023-12-21 DIAGNOSIS — F32A Depression, unspecified: Secondary | ICD-10-CM | POA: Diagnosis present

## 2023-12-21 DIAGNOSIS — G40909 Epilepsy, unspecified, not intractable, without status epilepticus: Secondary | ICD-10-CM | POA: Diagnosis present

## 2023-12-21 DIAGNOSIS — Z1152 Encounter for screening for COVID-19: Secondary | ICD-10-CM | POA: Diagnosis not present

## 2023-12-21 DIAGNOSIS — Z881 Allergy status to other antibiotic agents status: Secondary | ICD-10-CM | POA: Diagnosis not present

## 2023-12-21 DIAGNOSIS — Z833 Family history of diabetes mellitus: Secondary | ICD-10-CM | POA: Diagnosis not present

## 2023-12-21 DIAGNOSIS — F17210 Nicotine dependence, cigarettes, uncomplicated: Secondary | ICD-10-CM | POA: Diagnosis present

## 2023-12-21 DIAGNOSIS — Z803 Family history of malignant neoplasm of breast: Secondary | ICD-10-CM | POA: Diagnosis not present

## 2023-12-21 DIAGNOSIS — Z91148 Patient's other noncompliance with medication regimen for other reason: Secondary | ICD-10-CM | POA: Diagnosis not present

## 2023-12-21 DIAGNOSIS — Z5329 Procedure and treatment not carried out because of patient's decision for other reasons: Secondary | ICD-10-CM | POA: Diagnosis present

## 2023-12-21 DIAGNOSIS — Z888 Allergy status to other drugs, medicaments and biological substances status: Secondary | ICD-10-CM | POA: Diagnosis not present

## 2023-12-21 DIAGNOSIS — F419 Anxiety disorder, unspecified: Secondary | ICD-10-CM | POA: Diagnosis not present

## 2023-12-21 DIAGNOSIS — J9621 Acute and chronic respiratory failure with hypoxia: Secondary | ICD-10-CM | POA: Diagnosis present

## 2023-12-21 DIAGNOSIS — Z818 Family history of other mental and behavioral disorders: Secondary | ICD-10-CM | POA: Diagnosis not present

## 2023-12-21 DIAGNOSIS — F17200 Nicotine dependence, unspecified, uncomplicated: Secondary | ICD-10-CM | POA: Diagnosis not present

## 2023-12-21 DIAGNOSIS — Z90710 Acquired absence of both cervix and uterus: Secondary | ICD-10-CM | POA: Diagnosis not present

## 2023-12-21 DIAGNOSIS — E8729 Other acidosis: Secondary | ICD-10-CM | POA: Diagnosis present

## 2023-12-21 DIAGNOSIS — Z91030 Bee allergy status: Secondary | ICD-10-CM | POA: Diagnosis not present

## 2023-12-21 DIAGNOSIS — J9622 Acute and chronic respiratory failure with hypercapnia: Secondary | ICD-10-CM | POA: Diagnosis present

## 2023-12-21 DIAGNOSIS — T426X6A Underdosing of other antiepileptic and sedative-hypnotic drugs, initial encounter: Secondary | ICD-10-CM | POA: Diagnosis not present

## 2023-12-21 LAB — I-STAT CHEM 8, ED
BUN: 21 mg/dL — ABNORMAL HIGH (ref 6–20)
Calcium, Ion: 1.14 mmol/L — ABNORMAL LOW (ref 1.15–1.40)
Chloride: 104 mmol/L (ref 98–111)
Creatinine, Ser: 0.8 mg/dL (ref 0.44–1.00)
Glucose, Bld: 135 mg/dL — ABNORMAL HIGH (ref 70–99)
HCT: 45 % (ref 36.0–46.0)
Hemoglobin: 15.3 g/dL — ABNORMAL HIGH (ref 12.0–15.0)
Potassium: 4.3 mmol/L (ref 3.5–5.1)
Sodium: 139 mmol/L (ref 135–145)
TCO2: 24 mmol/L (ref 22–32)

## 2023-12-21 LAB — CBC WITH DIFFERENTIAL/PLATELET
Abs Immature Granulocytes: 0.15 K/uL — ABNORMAL HIGH (ref 0.00–0.07)
Basophils Absolute: 0 K/uL (ref 0.0–0.1)
Basophils Relative: 0 %
Eosinophils Absolute: 0 K/uL (ref 0.0–0.5)
Eosinophils Relative: 0 %
HCT: 43.4 % (ref 36.0–46.0)
Hemoglobin: 13.8 g/dL (ref 12.0–15.0)
Immature Granulocytes: 1 %
Lymphocytes Relative: 11 %
Lymphs Abs: 2 K/uL (ref 0.7–4.0)
MCH: 27.7 pg (ref 26.0–34.0)
MCHC: 31.8 g/dL (ref 30.0–36.0)
MCV: 87 fL (ref 80.0–100.0)
Monocytes Absolute: 0.8 K/uL (ref 0.1–1.0)
Monocytes Relative: 5 %
Neutro Abs: 15.6 K/uL — ABNORMAL HIGH (ref 1.7–7.7)
Neutrophils Relative %: 83 %
Platelets: 351 K/uL (ref 150–400)
RBC: 4.99 MIL/uL (ref 3.87–5.11)
RDW: 14.3 % (ref 11.5–15.5)
WBC: 18.6 K/uL — ABNORMAL HIGH (ref 4.0–10.5)
nRBC: 0 % (ref 0.0–0.2)

## 2023-12-21 LAB — RESP PANEL BY RT-PCR (RSV, FLU A&B, COVID)  RVPGX2
Influenza A by PCR: NEGATIVE
Influenza B by PCR: NEGATIVE
Resp Syncytial Virus by PCR: NEGATIVE
SARS Coronavirus 2 by RT PCR: NEGATIVE

## 2023-12-21 LAB — COMPREHENSIVE METABOLIC PANEL WITH GFR
ALT: 11 U/L (ref 0–44)
AST: 29 U/L (ref 15–41)
Albumin: 3.8 g/dL (ref 3.5–5.0)
Alkaline Phosphatase: 158 U/L — ABNORMAL HIGH (ref 38–126)
Anion gap: 9 (ref 5–15)
BUN: 15 mg/dL (ref 6–20)
CO2: 26 mmol/L (ref 22–32)
Calcium: 9.2 mg/dL (ref 8.9–10.3)
Chloride: 103 mmol/L (ref 98–111)
Creatinine, Ser: 0.88 mg/dL (ref 0.44–1.00)
GFR, Estimated: >60 mL/min (ref >60)
Glucose, Bld: 138 mg/dL — ABNORMAL HIGH (ref 70–99)
Potassium: 4.3 mmol/L (ref 3.5–5.1)
Sodium: 138 mmol/L (ref 135–145)
Total Bilirubin: 1.1 mg/dL (ref 0.0–1.2)
Total Protein: 6.9 g/dL (ref 6.5–8.1)

## 2023-12-21 LAB — ECHOCARDIOGRAM COMPLETE
AR max vel: 2.68 cm2
AV Area VTI: 2.64 cm2
AV Area mean vel: 2.63 cm2
AV Mean grad: 9 mmHg
AV Peak grad: 16.5 mmHg
Ao pk vel: 2.03 m/s
Area-P 1/2: 4.36 cm2
Calc EF: 66.1 %
Height: 63 in
S' Lateral: 3.1 cm
Single Plane A2C EF: 66.7 %
Single Plane A4C EF: 65.9 %
Weight: 2048 [oz_av]

## 2023-12-21 LAB — RAPID URINE DRUG SCREEN, HOSP PERFORMED
Amphetamines: NOT DETECTED
Barbiturates: NOT DETECTED
Benzodiazepines: NOT DETECTED
Cocaine: NOT DETECTED
Opiates: NOT DETECTED
Tetrahydrocannabinol: POSITIVE — AB

## 2023-12-21 LAB — MAGNESIUM: Magnesium: 2 mg/dL (ref 1.7–2.4)

## 2023-12-21 LAB — ETHANOL: Alcohol, Ethyl (B): <15 mg/dL (ref <15)

## 2023-12-21 MED ORDER — ONDANSETRON HCL 4 MG/2ML IJ SOLN
INTRAMUSCULAR | Status: AC
  Filled 2023-12-21: qty 2

## 2023-12-21 MED ORDER — PANTOPRAZOLE SODIUM 40 MG PO TBEC: 40.0000 mg | DELAYED_RELEASE_TABLET | Freq: Every day | ORAL | 3 refills | Status: DC

## 2023-12-21 MED ORDER — DIPHENHYDRAMINE HCL 50 MG/ML IJ SOLN
12.5000 mg | Freq: Once | INTRAMUSCULAR | Status: AC
  Administered 2023-12-21: 12.5 mg via INTRAVENOUS
  Filled 2023-12-21: qty 1

## 2023-12-21 MED ORDER — ATORVASTATIN CALCIUM 40 MG PO TABS: 40.0000 mg | ORAL_TABLET | Freq: Every day | ORAL | 5 refills | Status: AC

## 2023-12-21 MED ORDER — ACETAMINOPHEN 650 MG RE SUPP: 650.0000 mg | Freq: Four times a day (QID) | RECTAL | Status: DC | PRN

## 2023-12-21 MED ORDER — GUAIFENESIN ER 600 MG PO TB12: 600.0000 mg | ORAL_TABLET | Freq: Two times a day (BID) | ORAL | 0 refills | Status: DC

## 2023-12-21 MED ORDER — ALBUTEROL SULFATE HFA 108 (90 BASE) MCG/ACT IN AERS: 2.0000 | INHALATION_SPRAY | RESPIRATORY_TRACT | 3 refills | Status: AC | PRN

## 2023-12-21 MED ORDER — LEVETIRACETAM (KEPPRA) 500 MG/5 ML ADULT IV PUSH
1500.0000 mg | Freq: Once | INTRAVENOUS | Status: AC
  Administered 2023-12-21: 1500 mg via INTRAVENOUS
  Filled 2023-12-21: qty 15

## 2023-12-21 MED ORDER — AMLODIPINE BESYLATE 5 MG PO TABS: 5.0000 mg | ORAL_TABLET | Freq: Every day | ORAL | 5 refills | Status: AC

## 2023-12-21 MED ORDER — ENOXAPARIN SODIUM 40 MG/0.4ML IJ SOSY
40.0000 mg | PREFILLED_SYRINGE | Freq: Every day | INTRAMUSCULAR | Status: DC
  Administered 2023-12-22: 40 mg via SUBCUTANEOUS
  Filled 2023-12-21: qty 0.4

## 2023-12-21 MED ORDER — ONDANSETRON HCL 4 MG PO TABS: 4.0000 mg | ORAL_TABLET | Freq: Four times a day (QID) | ORAL | Status: DC | PRN

## 2023-12-21 MED ORDER — METHYLPREDNISOLONE SODIUM SUCC 40 MG IJ SOLR
40.0000 mg | Freq: Every day | INTRAMUSCULAR | Status: DC
  Administered 2023-12-22: 40 mg via INTRAVENOUS
  Filled 2023-12-21: qty 1

## 2023-12-21 MED ORDER — IPRATROPIUM-ALBUTEROL 0.5-2.5 (3) MG/3ML IN SOLN
3.0000 mL | Freq: Four times a day (QID) | RESPIRATORY_TRACT | Status: DC
  Administered 2023-12-22 (×2): 3 mL via RESPIRATORY_TRACT
  Filled 2023-12-21 (×3): qty 3

## 2023-12-21 MED ORDER — DOXYCYCLINE HYCLATE 100 MG PO CAPS: 100.0000 mg | ORAL_CAPSULE | Freq: Two times a day (BID) | ORAL | 0 refills | Status: AC

## 2023-12-21 MED ORDER — METOCLOPRAMIDE HCL 5 MG/ML IJ SOLN
5.0000 mg | Freq: Once | INTRAMUSCULAR | Status: AC
  Administered 2023-12-21: 5 mg via INTRAVENOUS
  Filled 2023-12-21: qty 2

## 2023-12-21 MED ORDER — IPRATROPIUM-ALBUTEROL 0.5-2.5 (3) MG/3ML IN SOLN: 3.0000 mL | Freq: Four times a day (QID) | RESPIRATORY_TRACT | Status: DC | PRN

## 2023-12-21 MED ORDER — IPRATROPIUM-ALBUTEROL 0.5-2.5 (3) MG/3ML IN SOLN: 3.0000 mL | Freq: Four times a day (QID) | RESPIRATORY_TRACT | 2 refills | Status: AC

## 2023-12-21 MED ORDER — SODIUM CHLORIDE 0.9% FLUSH: 3.0000 mL | INTRAVENOUS | Status: DC | PRN

## 2023-12-21 MED ORDER — LEVETIRACETAM (KEPPRA) 500 MG/5 ML ADULT IV PUSH
500.0000 mg | Freq: Two times a day (BID) | INTRAVENOUS | Status: DC
  Administered 2023-12-22: 500 mg via INTRAVENOUS
  Filled 2023-12-21: qty 5

## 2023-12-21 MED ORDER — PREDNISONE 20 MG PO TABS: 40.0000 mg | ORAL_TABLET | Freq: Every day | ORAL | 0 refills | Status: DC

## 2023-12-21 MED ORDER — ACETAMINOPHEN 325 MG PO TABS
650.0000 mg | ORAL_TABLET | Freq: Four times a day (QID) | ORAL | Status: DC | PRN
  Administered 2023-12-22: 650 mg via ORAL
  Filled 2023-12-21: qty 2

## 2023-12-21 MED ORDER — SODIUM CHLORIDE 0.9% FLUSH
3.0000 mL | Freq: Two times a day (BID) | INTRAVENOUS | Status: DC
  Administered 2023-12-22: 3 mL via INTRAVENOUS

## 2023-12-21 MED ORDER — ONDANSETRON HCL 4 MG/2ML IJ SOLN: 4.0000 mg | Freq: Four times a day (QID) | INTRAMUSCULAR | Status: DC | PRN

## 2023-12-21 MED ORDER — SODIUM CHLORIDE 0.9 % IV BOLUS
1000.0000 mL | Freq: Once | INTRAVENOUS | Status: AC
  Administered 2023-12-21: 1000 mL via INTRAVENOUS

## 2023-12-21 MED ORDER — ONDANSETRON HCL 4 MG/2ML IJ SOLN
4.0000 mg | Freq: Once | INTRAMUSCULAR | Status: AC
  Administered 2023-12-21: 4 mg via INTRAVENOUS

## 2023-12-21 MED ORDER — SODIUM CHLORIDE 0.9 % IV SOLN
1.0000 g | Freq: Every day | INTRAVENOUS | Status: DC
  Administered 2023-12-22: 1 g via INTRAVENOUS
  Filled 2023-12-21: qty 10

## 2023-12-21 MED ORDER — SODIUM CHLORIDE 0.9 % IV SOLN: 250.0000 mL | INTRAVENOUS | Status: DC | PRN

## 2023-12-21 MED ORDER — LORAZEPAM 2 MG/ML IJ SOLN: 2.0000 mg | Freq: Four times a day (QID) | INTRAMUSCULAR | Status: DC | PRN

## 2023-12-21 MED ADMIN — Dextromethorphan-Guaifenesin Tab ER 12HR 30-600 MG: 1 | ORAL | NDC 63824005634
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
## 2023-12-21 NOTE — ED Notes (Signed)
 Contacted pharmacy again for patients Doxycyline.

## 2023-12-21 NOTE — Progress Notes (Signed)
°  Echocardiogram 2D Echocardiogram has been performed.  Alexandra Sanchez 12/21/2023, 12:03 PM
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
## 2023-12-21 NOTE — ED Notes (Signed)
 Patient ambulated to the restroom

## 2023-12-21 NOTE — Discharge Instructions (Signed)
 1) smoking cessation advised--May use over-the-counter nicotine  patch to help you quit smoking 2) please take medications as prescribed-- 3) please follow-up primary care physician as advised for recheck 4) you have decided to leave the hospital AGAINST MEDICAL ADVICE--- despite persuasion to the contrary

## 2023-12-21 NOTE — Progress Notes (Addendum)
° °  Pt Left AMA--with Saline Lock still in her arm.  Staff tried to reach patient so that her IV saline lock can be safely removed from her Rt arm.   Rendall Carwin, MD

## 2023-12-21 NOTE — ED Provider Notes (Signed)
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 hospitalization was considered, and patient admitted to the hospital.    Final Clinical Impression(s) / ED Diagnoses Final diagnoses:  Seizure (HCC)         [1]  Social History Tobacco Use   Smoking status: Every Day    Current packs/day: 1.00    Types: Cigarettes    Passive exposure: Current   Smokeless tobacco: Never   Tobacco comments:    Smokes 1/2 to 3/4 pack per day 06/20/20  Vaping Use   Vaping status: Some Days  Substance Use Topics   Alcohol use: No   Drug use: Yes    Types: Marijuana     Elnor Jayson LABOR, DO 12/21/23 2317

## 2023-12-21 NOTE — Progress Notes (Signed)
°   12/21/23 1040  TOC Brief Assessment  Insurance and Status Reviewed  Patient has primary care physician Yes  Home environment has been reviewed From Home  Prior level of function: Independent  Prior/Current Home Services No current home services  Social Drivers of Health Review SDOH reviewed no interventions necessary  Readmission risk has been reviewed Yes  Transition of care needs no transition of care needs at this time   Inpatient Care Management (ICM) has reviewed patient and no other ICM needs have been identified at this time. We will continue to monitor patient advancement through interdisciplinary progression rounds. If new patient transition needs arise, please place a ICM consult.

## 2023-12-21 NOTE — ED Notes (Addendum)
 Pt pulled out of POV and taken to ED room 6. Pt reportedly had a sz. Pt was unresponsive and now responding to verbal stimuli appearing postictal. Pt was placed on NRB O2 at 15 LPM  CBG 137
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
## 2023-12-21 NOTE — Plan of Care (Signed)

## 2023-12-21 NOTE — ED Notes (Signed)
 Contacted pharmacy about doxycycline .

## 2023-12-21 NOTE — ED Notes (Signed)
Transport called to transport patient upstairs.

## 2023-12-21 NOTE — H&P (Addendum)
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 patient's presenting symptoms, physical exam findings, and initial radiographic and laboratory data in the context of their chronic comorbidities is felt to place them at high risk for further clinical deterioration. Furthermore, it is not anticipated that the patient will be medically stable for discharge from the hospital within 2 midnights of admission.   * I certify that at the point of admission it is my clinical judgment that the patient will require inpatient hospital care spanning beyond 2 midnights from the point of admission due to high intensity of service, high risk for further deterioration and high frequency of surveillance required.DEWAINE    Arlyn Buerkle, MD [facility]  How to contact the [facility] Attending or Consulting provider 7A - 7P or covering provider during after hours 7P -7A, for this patient.  Check the care team in [facility] and look for a) attending/consulting [facility] provider listed and b) the [facility] team listed Log into  [email or web address] and use [facility] universal password to access. If you do not have the password, please contact the hospital operator. Locate the [facility] provider you are looking for under [facility] and page to a number that you can be directly reached. If you still have difficulty reaching the provider, please page the [facility] (Director on Call) for the Hospitalists listed on amion for assistance.  12/22/2023, 3:04 AM           [1]  Allergies Allergen Reactions   Aspirin Anaphylaxis, Shortness Of Breath, Swelling and Other (See Comments)    Swelling of tongue and throat   Bee Pollen Anaphylaxis and Other (See Comments)    STOPS BREATHING   Haloperidol Anaphylaxis   Haloperidol Lactate Shortness Of Breath   Latex Shortness Of Breath and Swelling   Bee Venom    Erythromycin Hives    Stomach cramps

## 2023-12-22 ENCOUNTER — Inpatient Hospital Stay (HOSPITAL_COMMUNITY)
Admission: RE | Admit: 2023-12-22 | Discharge: 2023-12-22 | Disposition: A | Payer: MEDICAID | Source: Ambulatory Visit | Attending: Student in an Organized Health Care Education/Training Program

## 2023-12-22 ENCOUNTER — Inpatient Hospital Stay (HOSPITAL_COMMUNITY): Payer: MEDICAID

## 2023-12-22 DIAGNOSIS — G40909 Epilepsy, unspecified, not intractable, without status epilepticus: Secondary | ICD-10-CM

## 2023-12-22 LAB — HEMOGLOBIN A1C
Hgb A1c MFr Bld: 5.2 % (ref 4.8–5.6)
Mean Plasma Glucose: 102.54 mg/dL

## 2023-12-22 LAB — CBC
HCT: 38 % (ref 36.0–46.0)
Hemoglobin: 12.2 g/dL (ref 12.0–15.0)
MCH: 27.5 pg (ref 26.0–34.0)
MCHC: 32.1 g/dL (ref 30.0–36.0)
MCV: 85.6 fL (ref 80.0–100.0)
Platelets: 285 K/uL (ref 150–400)
RBC: 4.44 MIL/uL (ref 3.87–5.11)
RDW: 14.3 % (ref 11.5–15.5)
WBC: 11.9 K/uL — ABNORMAL HIGH (ref 4.0–10.5)
nRBC: 0 % (ref 0.0–0.2)

## 2023-12-22 LAB — BASIC METABOLIC PANEL WITH GFR
Anion gap: 11 (ref 5–15)
BUN: 12 mg/dL (ref 6–20)
CO2: 25 mmol/L (ref 22–32)
Calcium: 8.4 mg/dL — ABNORMAL LOW (ref 8.9–10.3)
Chloride: 102 mmol/L (ref 98–111)
Creatinine, Ser: 0.69 mg/dL (ref 0.44–1.00)
GFR, Estimated: >60 mL/min (ref >60)
Glucose, Bld: 110 mg/dL — ABNORMAL HIGH (ref 70–99)
Potassium: 4.8 mmol/L (ref 3.5–5.1)
Sodium: 138 mmol/L (ref 135–145)

## 2023-12-22 LAB — CREATININE, SERUM
Creatinine, Ser: 0.72 mg/dL (ref 0.44–1.00)
GFR, Estimated: >60 mL/min (ref >60)

## 2023-12-22 LAB — TSH: TSH: 0.421 u[IU]/mL (ref 0.350–4.500)

## 2023-12-22 LAB — VITAMIN B12: Vitamin B-12: <150 pg/mL — ABNORMAL LOW (ref 180–914)

## 2023-12-22 LAB — FOLATE: Folate: 6.3 ng/mL (ref >5.9)

## 2023-12-22 MED ORDER — ARFORMOTEROL TARTRATE 15 MCG/2ML IN NEBU
15.0000 ug | INHALATION_SOLUTION | Freq: Two times a day (BID) | RESPIRATORY_TRACT | Status: DC
  Administered 2023-12-22: 15 ug via RESPIRATORY_TRACT
  Filled 2023-12-22: qty 2

## 2023-12-22 MED ORDER — LEVETIRACETAM 500 MG PO TABS: 500.0000 mg | ORAL_TABLET | Freq: Two times a day (BID) | ORAL | 0 refills | Status: DC

## 2023-12-22 MED ORDER — VITAMIN B-12 1000 MCG PO TABS: 1000.0000 ug | ORAL_TABLET | Freq: Every day | ORAL | 0 refills | Status: DC

## 2023-12-22 MED ORDER — FOLIC ACID 1 MG PO TABS: 1.0000 mg | ORAL_TABLET | Freq: Every day | ORAL | 0 refills | Status: DC

## 2023-12-22 MED ORDER — BUDESONIDE 0.25 MG/2ML IN SUSP
0.2500 mg | Freq: Two times a day (BID) | RESPIRATORY_TRACT | Status: DC
  Administered 2023-12-22: 0.25 mg via RESPIRATORY_TRACT
  Filled 2023-12-22: qty 2

## 2023-12-22 MED ORDER — ALBUTEROL SULFATE (2.5 MG/3ML) 0.083% IN NEBU: 2.5000 mg | INHALATION_SOLUTION | RESPIRATORY_TRACT | Status: DC | PRN

## 2023-12-22 MED ORDER — PREDNISONE 20 MG PO TABS: 40.0000 mg | ORAL_TABLET | Freq: Every day | ORAL | Status: DC

## 2023-12-22 NOTE — ED Notes (Signed)
 Patient took out her own IV and removed her own EEG leads and states she is going home. I reached out to the doctor and made them aware. HE called her on the phone to express the importance of staying and in the end she decided not to. Patient was instructed on follow up, when to return, and where to pick up medications.

## 2023-12-22 NOTE — Progress Notes (Signed)
 LTM EEG hooked up and running - no initial skin breakdown - push button tested - [facility] not monitoring while Pt is in ED.

## 2023-12-22 NOTE — Progress Notes (Addendum)
 PROGRESS NOTE    Alexandra Sanchez  FMW:[id number] DOB: 06-11-1965 DOA: 12/21/2023 PCP: Jacques Arlon PARAS, MD  Chief Complaint  Patient presents with   Seizures    Brief Narrative:   58 yo with hx epilepsy, COPD, anxiety, depression, HTN and other medical issues here after [facility] from [facility] (admitted at that time for COPD exacerbation, seizures, syncope) with recurrent seizures and Khaliq Turay COPD exacerbation.    Assessment & Plan:   Principal Problem:   Recurrent seizures (HCC) Active Problems:   COPD with acute exacerbation (HCC)   Tobacco use disorder   Anxiety and depression   Essential hypertension  Recurrent seizure History of seizure-medication noncompliance -Presented to emergency department for 2 episodes of seizure.  She was admitted to [facility] 24 hours ago for recurrent episode of seizure and COPD exacerbation  - Had 2 episode of witnessed seizure after leaving AMA from [facility] today. -- she tells me she hasn't refilled her keppra  in about [place] year -Lab work, CBC showing leukocytosis 18.  CMP unremarkable except elevated ALP of 158.  UDS positive for THC.  Negative blood alcohol level.  Pending Keppra  level.  Normal mag level.  Negative covid, flu, RSV. - CT head no acute intracranial abnormality. - In the ED patient loaded with IV Keppra  3 g.  Received Reglan , Benadryl  Zofran  and 1 L of NS bolus. - neurology c/s, appreciate recommendations - keppra  500 mg BID, eeg - Continue seizure precaution and aspiration precaution. - Continue IV Ativan  as needed for seizure. - EEG pending - Continue cardiac monitoring.   Per [place] statutes, patients with seizures are not allowed to drive until  they have been seizure-free for six months. Use caution when using heavy equipment or power tools. Avoid working on ladders or at heights. Take showers instead of baths. Ensure the water temperature is not too high on the home water heater. Do not go swimming alone.  When caring for infants or small children, sit down when holding, feeding, or changing them to minimize risk of injury to the child in the event you have Livvy Spilman seizure. Also, Maintain good sleep hygiene. Avoid alcohol.  COPD acute exacerbation -Continue treatment for COPD with IV steroid, DuoNebs scheduled and as needed.  Patient has history of hives/allergy azithromycin.  Continue IV ceftriaxone  for 5 days course --CXR with coarsened interstitial lung markings without edema --will need to ensure she has rx for controller medicine at discharge - Continue supportive care.   Syncope - admitted to [facility] with concern for this issue - at that time sx seemed c/w orthostasis - echo with mild AS, carotid US  with 1-49% stenosis to bilateral carotid arteries - orthostatics - therapy eval  Peripheral Neuropathy - describes burning in her hands and feet - follow b12, folate, TSH, A1c  Leukocytosis - related to steroids, stress response? - improving, trend  Hyperglycemia, steroid induced - follow A1c  History of chronic smoking cigarette History of marijuana smoking -UDS positive for marijuana.  Continue nicotine  patch and counseled patient for smoking cessation   Generalized anxiety disorder and depression Essential hypertension Currently not taking any medications at home.    DVT prophylaxis: lovenox  Code Status: full Family Communication: none Disposition:   Status is: Inpatient Remains inpatient appropriate because: need for continued inpatient care   Consultants:  neurology   Procedures:  EEG Echo IMPRESSIONS     1. Left ventricular ejection fraction, by estimation, is 60 to 65%. Left  ventricular ejection fraction  by 2D MOD biplane is 66.1 %. Left  ventricular ejection fraction by PLAX is 59 %. The left ventricle has  normal function. The left ventricle has no  regional wall motion abnormalities. Left ventricular diastolic parameters  were normal.   2. Right ventricular  systolic function is normal. The right ventricular  size is normal. The estimated right ventricular systolic pressure is 9.9  mmHg.   3. The mitral valve is normal in structure. No evidence of mitral valve  regurgitation. No evidence of mitral stenosis.   4. The inferior vena cava is normal in size with greater than 50%  respiratory variability, suggesting right atrial pressure of 3 mmHg.   5. The aortic valve is calcified. Possiblefusion of the left and  non-coronary cusps. Aortic valve regurgitation is not visualized. Aortic  valve sclerosis/calcification is present, without any evidence of aortic  stenosis. Aortic valve area, by VTI  measures 2.64 cm. Aortic valve mean gradient measures 9.0 mmHg. Aortic  valve Vmax measures 2.03 m/s. Peak gradient 16.5 mmHg, DI 0.64. Based on  DI, there does not appear to be significant stenosis.   Comparison(s): Changes from prior study are noted. 10/17/2021: LVEF  60-65%, mild AS - mean gradient 8.5 mmHg - however DI normal at 0.56.   Antimicrobials:  Anti-infectives (From admission, onward)    Start     Dose/Rate Route Frequency Ordered Stop   12/21/23 2345  cefTRIAXone  (ROCEPHIN ) 1 g in sodium chloride  0.9 % 100 mL IVPB        1 g 200 mL/hr over 30 Minutes Intravenous Daily at bedtime 12/21/23 2326 12/26/23 2159       Subjective: No new complaints Left [facility] bc she wanted to move her new car Willing to stay at this time  Objective: Vitals:   12/22/23 0615 12/22/23 0700 12/22/23 0729 12/22/23 0730  BP: 92/69 116/69  101/62  Pulse: 70 76  67  Resp: 19 12  15   Temp:   97.8 F (36.6 C)   TempSrc:   Oral   SpO2: 95% 97%  100%   No intake or output data in the 24 hours ending 12/22/23 0850 There were no vitals filed for this visit.  Examination:  General exam: Appears calm and comfortable  Respiratory system: no increased wob, but diffused scattered wheezing, occasional cough Cardiovascular system: RRR Gastrointestinal system:  Abdomen is nondistended, soft and nontender.  Central nervous system: Alert and oriented. No focal neurological deficits. Extremities: no LEE   Data Reviewed: I have personally reviewed following labs and imaging studies  CBC: Recent Labs  Lab 12/20/23 1657 12/21/23 2127 12/21/23 2132 12/22/23 0514  WBC 11.0* 18.6*  --  11.9*  NEUTROABS  --  15.6*  --   --   HGB 14.2 13.8 15.3* 12.2  HCT 45.4 43.4 45.0 38.0  MCV 87.1 87.0  --  85.6  PLT 279 351  --  285    Basic Metabolic Panel: Recent Labs  Lab 12/20/23 1657 12/21/23 2127 12/21/23 2132 12/22/23 0514  NA 141 138 139 138  K 3.9 4.3 4.3 4.8  CL 104 103 104 102  CO2 25 26  --  25  GLUCOSE 87 138* 135* 110*  BUN 9 15 21* 12  CREATININE 0.68 0.88 0.80 0.69  CALCIUM  9.0 9.2  --  8.4*  MG  --  2.0  --   --     GFR: Estimated Creatinine Clearance: 62.6 mL/min (by C-G formula based on SCr of 0.69 mg/dL).  Liver Function Tests: Recent Labs  Lab 12/21/23 2127  AST 29  ALT 11  ALKPHOS 158*  BILITOT 1.1  PROT 6.9  ALBUMIN 3.8    CBG: No results for input(s): GLUCAP in the last 168 hours.   Recent Results (from the past 240 hours)  Resp panel by RT-PCR (RSV, Flu Shantaya Bluestone&B, Covid) Anterior Nasal Swab     Status: None   Collection Time: 12/20/23  7:05 PM   Specimen: Anterior Nasal Swab  Result Value Ref Range Status   SARS Coronavirus 2 by RT PCR NEGATIVE NEGATIVE Final    Comment: (NOTE) SARS-CoV-2 target nucleic acids are NOT DETECTED.  The SARS-CoV-2 RNA is generally detectable in upper respiratory specimens during the acute phase of infection. The lowest concentration of SARS-CoV-2 viral copies this assay can detect is 138 copies/mL. Anjenette Gerbino negative result does not preclude SARS-Cov-2 infection and should not be used as the sole basis for treatment or other patient management decisions. Yaacov Koziol negative result may occur with  improper specimen collection/handling, submission of specimen other than nasopharyngeal swab,  presence of viral mutation(s) within the areas targeted by this assay, and inadequate number of viral copies(<138 copies/mL). Luvina Poirier negative result must be combined with clinical observations, patient history, and epidemiological information. The expected result is Negative.  Fact Sheet for Patients:  bloggercourse.com  Fact Sheet for Healthcare Providers:  seriousbroker.it  This test is no t yet approved or cleared by the [place]  FDA and  has been authorized for detection and/or diagnosis of SARS-CoV-2 by FDA under an Emergency Use Authorization (EUA). This EUA will remain  in effect (meaning this test can be used) for the duration of the COVID-19 declaration under Section 564(b)(1) of the Act, 21 U.S.C.section 360bbb-3(b)(1), unless the authorization is terminated  or revoked sooner.       Influenza Kalesha Irving by PCR NEGATIVE NEGATIVE Final   Influenza B by PCR NEGATIVE NEGATIVE Final    Comment: (NOTE) The Xpert Xpress SARS-CoV-2/FLU/RSV plus assay is intended as an aid in the diagnosis of influenza from Nasopharyngeal swab specimens and should not be used as Rudell Marlowe sole basis for treatment. Nasal washings and aspirates are unacceptable for Xpert Xpress SARS-CoV-2/FLU/RSV testing.  Fact Sheet for Patients: bloggercourse.com  Fact Sheet for Healthcare Providers: seriousbroker.it  This test is not yet approved or cleared by the [place]  FDA and has been authorized for detection and/or diagnosis of SARS-CoV-2 by FDA under an Emergency Use Authorization (EUA). This EUA will remain in effect (meaning this test can be used) for the duration of the COVID-19 declaration under Section 564(b)(1) of the Act, 21 U.S.C. section 360bbb-3(b)(1), unless the authorization is terminated or revoked.     Resp Syncytial Virus by PCR NEGATIVE NEGATIVE Final    Comment: (NOTE) Fact Sheet for  Patients: bloggercourse.com  Fact Sheet for Healthcare Providers: seriousbroker.it  This test is not yet approved or cleared by the [place]  FDA and has been authorized for detection and/or diagnosis of SARS-CoV-2 by FDA under an Emergency Use Authorization (EUA). This EUA will remain in effect (meaning this test can be used) for the duration of the COVID-19 declaration under Section 564(b)(1) of the Act, 21 U.S.C. section 360bbb-3(b)(1), unless the authorization is terminated or revoked.  Performed at [facility], [address], [place], [place] [address]   Resp panel by RT-PCR (RSV, Flu Nelta Caudill&B, Covid) Urine, Clean Catch     Status: None   Collection Time: 12/21/23  9:29 PM   Specimen: Urine, Clean  Catch; Nasal Swab  Result Value Ref Range Status   SARS Coronavirus 2 by RT PCR NEGATIVE NEGATIVE Final   Influenza Tyreik Delahoussaye by PCR NEGATIVE NEGATIVE Final   Influenza B by PCR NEGATIVE NEGATIVE Final    Comment: (NOTE) The Xpert Xpress SARS-CoV-2/FLU/RSV plus assay is intended as an aid in the diagnosis of influenza from Nasopharyngeal swab specimens and should not be used as Ronrico Dupin sole basis for treatment. Nasal washings and aspirates are unacceptable for Xpert Xpress SARS-CoV-2/FLU/RSV testing.  Fact Sheet for Patients: bloggercourse.com  Fact Sheet for Healthcare Providers: seriousbroker.it  This test is not yet approved or cleared by the [place]  FDA and has been authorized for detection and/or diagnosis of SARS-CoV-2 by FDA under an Emergency Use Authorization (EUA). This EUA will remain in effect (meaning this test can be used) for the duration of the COVID-19 declaration under Section 564(b)(1) of the Act, 21 U.S.C. section 360bbb-3(b)(1), unless the authorization is terminated or revoked.     Resp Syncytial Virus by PCR NEGATIVE NEGATIVE Final    Comment: (NOTE) Fact Sheet  for Patients: bloggercourse.com  Fact Sheet for Healthcare Providers: seriousbroker.it  This test is not yet approved or cleared by the [place]  FDA and has been authorized for detection and/or diagnosis of SARS-CoV-2 by FDA under an Emergency Use Authorization (EUA). This EUA will remain in effect (meaning this test can be used) for the duration of the COVID-19 declaration under Section 564(b)(1) of the Act, 21 U.S.C. section 360bbb-3(b)(1), unless the authorization is terminated or revoked.  Performed at [facility], [address], [place], [place] [address]          Radiology Studies: CT Head Wo Contrast Result Date: 12/21/2023 EXAM: CT HEAD WITHOUT 12/21/2023 09:47:07 PM TECHNIQUE: CT of the head was performed without the administration of intravenous contrast. Automated exposure control, iterative reconstruction, and/or weight based adjustment of the mA/kV was utilized to reduce the radiation dose to as low as reasonably achievable. COMPARISON: 12/20/2023 CLINICAL HISTORY: Seizure disorder, clinical change. FINDINGS: BRAIN AND VENTRICLES: No acute intracranial hemorrhage. No mass effect or midline shift. No extra-axial fluid collection. No evidence of acute infarct. ORBITS: No acute abnormality. SINUSES AND MASTOIDS: No acute abnormality. SOFT TISSUES AND SKULL: No acute skull fracture. No acute soft tissue abnormality. IMPRESSION: 1. No acute intracranial abnormality. Electronically signed by: Oneil Devonshire MD 12/21/2023 09:50 PM EST RP Workstation: [id number]   DG Chest Portable 1 View Result Date: 12/21/2023 EXAM: 1 VIEW(S) XRAY OF THE CHEST 12/21/2023 09:46:00 PM COMPARISON: 12/20/2023 CLINICAL HISTORY: sz FINDINGS: LUNGS AND PLEURA: Coarsened interstitial markings without pulmonary edema. No focal pulmonary opacity. No pleural effusion. No pneumothorax. HEART AND MEDIASTINUM: No acute abnormality of the cardiac and  mediastinal silhouettes. BONES AND SOFT TISSUES: Cervical spine surgical hardware noted. No acute osseous abnormality. IMPRESSION: 1. Coarsened interstitial lung markings without pulmonary edema. Electronically signed by: Oneil Devonshire MD 12/21/2023 09:48 PM EST RP Workstation: [id number]   ECHOCARDIOGRAM COMPLETE Result Date: 12/21/2023    ECHOCARDIOGRAM REPORT   Patient Name:   BRYLIE SNEATH Burget Date of Exam: 12/21/2023 Medical Rec #:  [id number]        Height:       63.0 in Accession #:    [id number]       Weight:       128.0 lb Date of Birth:  17-Jul-1965         BSA:          1.600 m Patient Age:  58 years         BP:           124/61 mmHg Patient Gender: F                HR:           75 bpm. Exam Location:  [facility] Procedure: 2D Echo, Cardiac Doppler and Color Doppler (Both Spectral and Color            Flow Doppler were utilized during procedure). Indications:    R55 Syncope  History:        Patient has prior history of Echocardiogram examinations, most                 recent 10/17/2021. COPD, Signs/Symptoms:Shortness of Breath and                 Dyspnea; Risk Factors:Hypertension and Dyslipidemia.  Sonographer:    Ellouise Mose RDCS Referring Phys: [id number] [facility]  Sonographer Comments: Suboptimal parasternal window. IMPRESSIONS  1. Left ventricular ejection fraction, by estimation, is 60 to 65%. Left ventricular ejection fraction by 2D MOD biplane is 66.1 %. Left ventricular ejection fraction by PLAX is 59 %. The left ventricle has normal function. The left ventricle has no regional wall motion abnormalities. Left ventricular diastolic parameters were normal.  2. Right ventricular systolic function is normal. The right ventricular size is normal. The estimated right ventricular systolic pressure is 9.9 mmHg.  3. The mitral valve is normal in structure. No evidence of mitral valve regurgitation. No evidence of mitral stenosis.  4. The inferior vena cava is normal in size with greater than 50%  respiratory variability, suggesting right atrial pressure of 3 mmHg.  5. The aortic valve is calcified. Possiblefusion of the left and non-coronary cusps. Aortic valve regurgitation is not visualized. Aortic valve sclerosis/calcification is present, without any evidence of aortic stenosis. Aortic valve area, by VTI measures 2.64 cm. Aortic valve mean gradient measures 9.0 mmHg. Aortic valve Vmax measures 2.03 m/s. Peak gradient 16.5 mmHg, DI 0.64. Based on DI, there does not appear to be significant stenosis. Comparison(s): Changes from prior study are noted. 10/17/2021: LVEF 60-65%, mild AS - mean gradient 8.5 mmHg - however DI normal at 0.56. FINDINGS  Left Ventricle: Left ventricular ejection fraction, by estimation, is 60 to 65%. Left ventricular ejection fraction by PLAX is 59 %. Left ventricular ejection fraction by 2D MOD biplane is 66.1 %. The left ventricle has normal function. The left ventricle has no regional wall motion abnormalities. The left ventricular internal cavity size was normal in size. There is no left ventricular hypertrophy. Left ventricular diastolic parameters were normal. Right Ventricle: The right ventricular size is normal. No increase in right ventricular wall thickness. Right ventricular systolic function is normal. The tricuspid regurgitant velocity is 1.31 m/s, and with an assumed right atrial pressure of 3 mmHg, the estimated right ventricular systolic pressure is 9.9 mmHg. Left Atrium: Left atrial size was normal in size. Right Atrium: Right atrial size was normal in size. Pericardium: There is no evidence of pericardial effusion. Presence of epicardial fat layer. Mitral Valve: The mitral valve is normal in structure. No evidence of mitral valve regurgitation. No evidence of mitral valve stenosis. Tricuspid Valve: The tricuspid valve is normal in structure. Tricuspid valve regurgitation is not demonstrated. No evidence of tricuspid stenosis. Aortic Valve: Calcification of the  valve- possible fusion of the left and non-coronary cusps. The aortic valve is calcified. There is mild  calcification of the aortic valve. Aortic valve regurgitation is not visualized. Aortic valve sclerosis/calcification is present, without any evidence of aortic stenosis. Aortic valve mean gradient measures 9.0 mmHg. Aortic valve peak gradient measures 16.5 mmHg. Aortic valve area, by VTI measures 2.64 cm. Pulmonic Valve: The pulmonic valve was normal in structure. Pulmonic valve regurgitation is not visualized. No evidence of pulmonic stenosis. Aorta: The aortic root and ascending aorta are structurally normal, with no evidence of dilitation. Venous: The right upper pulmonary vein is normal. The inferior vena cava is normal in size with greater than 50% respiratory variability, suggesting right atrial pressure of 3 mmHg. IAS/Shunts: The interatrial septum appears to be lipomatous. No atrial level shunt detected by color flow Doppler.  LEFT VENTRICLE PLAX 2D                        Biplane EF (MOD) LV EF:         Left            LV Biplane EF:   Left                ventricular                      ventricular                ejection                         ejection                fraction by                      fraction by                PLAX is 59                       2D MOD                %.                               biplane is LVIDd:         4.50 cm                          66.1 %. LVIDs:         3.10 cm LV PW:         0.90 cm         Diastology LV IVS:        0.70 cm         LV e' medial:    9.90 cm/s LVOT diam:     2.30 cm         LV E/e' medial:  8.1 LV SV:         106             LV e' lateral:   9.68 cm/s LV SV Index:   66              LV E/e' lateral: 8.3 LVOT Area:     4.15 cm  LV Volumes (MOD) LV vol d, MOD    84.8 ml A2C: LV vol d, MOD    61.8 ml A4C: LV vol s, MOD  28.2 ml A2C: LV vol s, MOD    21.1 ml A4C: LV SV MOD A2C:   56.6 ml LV SV MOD A4C:   61.8 ml LV SV MOD BP:    50.7 ml RIGHT  VENTRICLE            IVC RV S prime:     9.90 cm/s  IVC diam: 1.50 cm TAPSE (M-mode): 1.6 cm                            PULMONARY VEINS                            Diastolic Velocity: 49.30 cm/s                            S/D Velocity:       0.80                            Systolic Velocity:  39.00 cm/s LEFT ATRIUM           Index        RIGHT ATRIUM          Index LA diam:      1.80 cm 1.13 cm/m   RA Area:     8.41 cm LA Vol (A2C): 22.5 ml 14.07 ml/m  RA Volume:   14.80 ml 9.25 ml/m LA Vol (A4C): 17.1 ml 10.69 ml/m  AORTIC VALVE AV Area (Vmax):    2.68 cm AV Area (Vmean):   2.63 cm AV Area (VTI):     2.64 cm AV Vmax:           203.25 cm/s AV Vmean:          135.500 cm/s AV VTI:            0.403 m AV Peak Grad:      16.5 mmHg AV Mean Grad:      9.0 mmHg LVOT Vmax:         131.00 cm/s LVOT Vmean:        85.900 cm/s LVOT VTI:          0.256 m LVOT/AV VTI ratio: 0.64  AORTA Ao Root diam: 2.80 cm MITRAL VALVE               TRICUSPID VALVE MV Area (PHT): 4.36 cm    TR Peak grad:   6.9 mmHg MV Decel Time: 174 msec    TR Vmax:        131.00 cm/s MV E velocity: 80.65 cm/s MV Filomena Pokorney velocity: 82.95 cm/s  SHUNTS MV E/Ad Guttman ratio:  0.97        Systemic VTI:  0.26 m                            Systemic Diam: 2.30 cm Vinie Maxcy MD Electronically signed by Vinie Maxcy MD Signature Date/Time: 12/21/2023/12:35:31 PM    Final    US  Carotid Bilateral Result Date: 12/21/2023 CLINICAL DATA:  Syncope, orthostatic EXAM: BILATERAL CAROTID DUPLEX ULTRASOUND TECHNIQUE: Elnor scale imaging, color Doppler and duplex ultrasound were performed of bilateral carotid and vertebral arteries in the neck. COMPARISON:  None Available. FINDINGS: Criteria: Quantification of carotid stenosis is based on velocity parameters that correlate the residual internal carotid  diameter with NASCET-based stenosis levels, using the diameter of the distal internal carotid lumen as the denominator for stenosis measurement. The following velocity measurements  were obtained: RIGHT ICA: 82/33 cm/sec CCA: 56/13 cm/sec SYSTOLIC ICA/CCA RATIO:  1.5 ECA:  69 cm/sec LEFT ICA: 83/33 cm/sec CCA: 70/18 cm/sec SYSTOLIC ICA/CCA RATIO:  1.2 ECA:  99 cm/sec RIGHT CAROTID ARTERY: Heterogeneous atherosclerotic plaque in the common and proximal internal carotid artery. By peak systolic velocity criteria, the estimated stenosis is less than 50%. RIGHT VERTEBRAL ARTERY:  Patent with normal antegrade flow. LEFT CAROTID ARTERY: Heterogeneous atherosclerotic plaque in the proximal internal carotid artery. By peak systolic velocity criteria, the estimated stenosis is less than 50%. LEFT VERTEBRAL ARTERY:  Patent with normal antegrade flow. IMPRESSION: 1. Mild (1-49%) stenosis proximal right internal carotid artery secondary to heterogenous atherosclerotic plaque. 2. Mild (1-49%) stenosis proximal left internal carotid artery secondary to heterogenous atherosclerotic plaque. 3. Vertebral arteries are patent with normal antegrade flow. Electronically Signed   By: Wilkie Lent M.D.   On: 12/21/2023 09:42   CT Head Wo Contrast Result Date: 12/20/2023 EXAM: CT HEAD WITHOUT 12/20/2023 06:11:50 PM TECHNIQUE: CT of the head was performed without the administration of intravenous contrast. Automated exposure control, iterative reconstruction, and/or weight based adjustment of the mA/kV was utilized to reduce the radiation dose to as low as reasonably achievable. COMPARISON: None available. CLINICAL HISTORY: fall FINDINGS: BRAIN AND VENTRICLES: No acute intracranial hemorrhage. No mass effect or midline shift. No extra-axial fluid collection. No evidence of acute infarct. No hydrocephalus. Subcortical and periventricular small vessel ischemic changes. ORBITS: No acute abnormality. SINUSES AND MASTOIDS: No acute abnormality. SOFT TISSUES AND SKULL: No acute skull fracture. No acute soft tissue abnormality. IMPRESSION: 1. No acute intracranial abnormality. Electronically signed by: Pinkie Pebbles MD 12/20/2023 07:10 PM EST RP Workstation: [id number]   DG Chest 2 View Result Date: 12/20/2023 EXAM: 2 VIEW(S) XRAY OF THE CHEST 12/20/2023 05:03:46 PM COMPARISON: 12/06/2023 CLINICAL HISTORY: SOB FINDINGS: LUNGS AND PLEURA: No focal pulmonary opacity. No pleural effusion. No pneumothorax. HEART AND MEDIASTINUM: No acute abnormality of the cardiac and mediastinal silhouettes. BONES AND SOFT TISSUES: Lower cervical spine hardware noted. No acute osseous abnormality. IMPRESSION: 1. No acute cardiopulmonary process. Electronically signed by: Pinkie Pebbles MD 12/20/2023 06:06 PM EST RP Workstation: [id number]        Scheduled Meds:  enoxaparin  (LOVENOX ) injection  40 mg Subcutaneous Daily   ipratropium-albuterol   3 mL Nebulization Q6H   levETIRAcetam   500 mg Intravenous BID   methylPREDNISolone  (SOLU-MEDROL ) injection  40 mg Intravenous Daily   ondansetron        sodium chloride  flush  3 mL Intravenous Q12H   Continuous Infusions:  sodium chloride      cefTRIAXone  (ROCEPHIN )  IV Stopped (12/22/23 0136)     LOS: 1 day    Time spent: over 30 min    Meliton Monte, MD [facility]   To contact the attending provider between 7A-7P or the covering provider during after hours 7P-7A, please log into the web site [email or web address] and access using universal [place] password for that web site. If you do not have the password, please call the hospital operator.  12/22/2023, 8:50 AM

## 2023-12-22 NOTE — Progress Notes (Signed)
° °  Brief Progress Note   _____________________________________________________________________________________________________________  Patient Name: Alexandra Sanchez Patient DOB: [birth year] Date: @TODAY @      Data: Reviewed vital signs, labs, and clinical notes.    Action: No action required at this time.    Response:  EEG results pending.  _____________________________________________________________________________________________________________  [facility] RN Expeditor Gudelia Eugene S Skyra Crichlow Please contact us  directly via secure chat (search for [facility]) or by calling us  at [phone] [facility]).

## 2023-12-22 NOTE — Progress Notes (Signed)
 Patient has removed EEG leads from the headbox and from off her head, per nurse pt is leaving AMA.  EEG tech will collect EEG cart and headbox

## 2023-12-22 NOTE — Discharge Summary (Addendum)
 Physician Discharge Summary  Alexandra Sanchez FMW:[id number] DOB: Mar 30, 1965 DOA: 12/21/2023  PCP: Jacques Arlon PARAS, MD  Admit date: 12/21/2023 Discharge date: 12/22/2023  Time spent: 40 minutes  Recommendations for Outpatient Follow-up:  Follow outpatient CBC/CMP  Needs outpatient neurology follow up  Follow seizure disorder outpatient Follow COPD exacerbation outpatient  B12 deficiency - follow supplementation and peripheral neuropathy symptoms outpatient  Abnormal CXR, needs follow up in 8-10 week s   This was an unplanned discharge, she was pulling off EEG leads and IV's.  She made earlier inference to leaving AMA and we had Akia Desroches discussion of the risks of leaving before she was medically ready.  She expressed understanding.  Discharged after she began pulling off her EEG/IV's and told nurse she was going to leave.  Keppra  rx sent.  B12 rx sent.  I discussed with her the discharge recommendations over the phone.   Discharge Diagnoses:  Principal Problem:   Recurrent seizures (HCC) Active Problems:   COPD with acute exacerbation (HCC)   Tobacco use disorder   Anxiety and depression   Essential hypertension   Discharge Condition: stable  Diet recommendation: regular  There were no vitals filed for this visit.  History of present illness:   58 yo with hx epilepsy, COPD, anxiety, depression, HTN and other medical issues here after leaving [facility] from [facility] (admitted at that time for COPD exacerbation, seizures, syncope) with recurrent seizures and [name] COPD exacerbation.   On hospital day 1, she elected to discharge against our advice.  She didn't want to stay in the emergency department any longer.  We discussed risks of leaving the hospital early, before we cleared her (not hx frequent AMA discharges).  Sent with rx for keppra .    Hospital Course:  Assessment and Plan:  Recurrent seizure History of seizure-medication noncompliance -Presented to emergency department for 2 episodes  of seizure.  She was admitted to [facility] 24 hours ago for recurrent episode of seizure and COPD exacerbation  - Had 2 episode of witnessed seizure after leaving AMA from [facility] today. -- she tells me she hasn't refilled her keppra  in about Sael Furches year -Lab work, CBC showing leukocytosis 18.  CMP unremarkable except elevated ALP of 158.  UDS positive for THC.  Negative blood alcohol level.  Pending Keppra  level.  Normal mag level.  Negative covid, flu, RSV. - CT head no acute intracranial abnormality. - In the ED patient loaded with IV Keppra  3 g.  Received Reglan , Benadryl  Zofran  and 1 L of NS bolus. - neurology c/s, appreciate recommendations - keppra  500 mg BID, eeg - Continue seizure precaution and aspiration precaution. - Continue IV Ativan  as needed for seizure. - EEG pending at time of discharge, did not complete planned duration - Continue cardiac monitoring. -- she's elected to discharge against our advice (which is not uncommon for her) - prescription for keppra  sent, discussed no driving   Per [place]  DMV statutes, patients with seizures are not allowed to drive until  they have been seizure-free for six months. Use caution when using heavy equipment or power tools. Avoid working on ladders or at heights. Take showers instead of baths. Ensure the water temperature is not too high on the home water heater. Do not go swimming alone. When caring for infants or small children, sit down when holding, feeding, or changing them to minimize risk of injury to the child in the event you have Alcee Sipos seizure. Also, Maintain good sleep hygiene. Avoid alcohol.  COPD acute exacerbation -Continue treatment for COPD with IV steroid, DuoNebs scheduled and as needed.  Patient has history of hives/allergy azithromycin.  Continue IV ceftriaxone  for 5 days course --CXR with coarsened interstitial lung markings without edema --she notes she has symbicort at discharge - she has prescription for  prednisone  and doxycycline  already.  Continue prn albuterol .    Syncope - admitted to [facility] with concern for this issue - at that time sx seemed c/w orthostasis - echo with mild AS, carotid US  with 1-49% stenosis to bilateral carotid arteries - she elected to discharge before orthostatics or therapy evaluation completed   Peripheral Neuropathy B12 Deficiency - describes burning in her hands and feet - follow b12, folate, TSH, A1c - supplement b12 and folate at discharge - follow pending b1 level   Leukocytosis - related to steroids, stress response? - improving, trend   Hyperglycemia, steroid induced - A1c 5.2   History of chronic smoking cigarette History of marijuana smoking -UDS positive for marijuana.  Continue nicotine  patch and counseled patient for smoking cessation   Generalized anxiety disorder and depression Essential hypertension Currently not taking any medications at home.     Procedures: none   Consultations: neurology  Discharge Exam: Vitals:   12/22/23 1230 12/22/23 1410  BP: 123/71   Pulse: (!) 59   Resp: 15   Temp:  97.9 F (36.6 C)  SpO2: 100%    Received message from the EEG tech that patient wanted to leave I called and spoke with her on the phone and let her know I think she warrants Avenir Lozinski continued hospital stay for more in depth neuro evaluation as well as continued treatment of COPD.  Discussed risks of discharging before she's medically ready.  She expressed understanding, but noted that she didn't want to sit in the ED all day.  She was willing to stay at the time of our conversation, but mentioned something along the lines of leaving later.  The RN messaged me later and let me know she was pulling off EEG and pulled her IV and wanted to leave.  Discharge Instructions   Discharge Instructions     Call MD for:  difficulty breathing, headache or visual disturbances   Complete by: As directed    Call MD for:  extreme fatigue   Complete by: As  directed    Call MD for:  hives   Complete by: As directed    Call MD for:  persistant dizziness or light-headedness   Complete by: As directed    Call MD for:  persistant nausea and vomiting   Complete by: As directed    Call MD for:  redness, tenderness, or signs of infection (pain, swelling, redness, odor or green/yellow discharge around incision site)   Complete by: As directed    Call MD for:  severe uncontrolled pain   Complete by: As directed    Call MD for:  temperature >100.4   Complete by: As directed    Discharge instructions   Complete by: As directed    You were seen for seizures and Binh Doten COPD exacerbation.  You should pick up your prescriptions from the drug store right away.  We send you Cipriana Biller seizure medicine as well as medicines for your COPD.    We do not think you're ready to discharge home.  You warrant additional evaluation by neurology for your seizures and additional treatment for your COPD exacerbation.  Discharging at this time puts you at risk for additional  seizures, respiratory distress, death, and disability related to complications.    Per [place]  DMV statutes, patients with seizures are not allowed to drive until  they have been seizure-free for six months. Use caution when using heavy equipment or power tools. Avoid working on ladders or at heights. Take showers instead of baths. Ensure the water temperature is not too high on the home water heater. Do not go swimming alone. When caring for infants or small children, sit down when holding, feeding, or changing them to minimize risk of injury to the child in the event you have Ekaterini Capitano seizure. Also, Maintain good sleep hygiene. Avoid alcohol.  Follow with neurology as an outpatient.  Follow with your PCP outpatient.  Return for new, recurrent, or worsening symptoms.  Please ask your PCP to request records from this hospitalization so they know what was done and what the next steps will be.   Increase activity  slowly   Complete by: As directed       Allergies as of 12/22/2023       Reactions   Aspirin Anaphylaxis, Shortness Of Breath, Swelling, Other (See Comments)   Swelling of tongue and throat   Bee Pollen Anaphylaxis, Other (See Comments)   STOPS BREATHING   Bee Venom Anaphylaxis   Haloperidol Anaphylaxis   Haloperidol Lactate Shortness Of Breath   Latex Shortness Of Breath, Swelling   Erythromycin Hives, Other (See Comments)   Stomach cramps        Medication List     TAKE these medications    albuterol  108 (90 Base) MCG/ACT inhaler Commonly known as: VENTOLIN  HFA Inhale 2 puffs into the lungs every 4 (four) hours as needed for wheezing or shortness of breath.   amLODipine  5 MG tablet Commonly known as: NORVASC  Take 1 tablet (5 mg total) by mouth daily.   atorvastatin  40 MG tablet Commonly known as: LIPITOR Take 1 tablet (40 mg total) by mouth daily.   doxycycline  100 MG capsule Commonly known as: VIBRAMYCIN  Take 1 capsule (100 mg total) by mouth 2 (two) times daily for 5 days.   guaiFENesin  600 MG 12 hr tablet Commonly known as: Mucinex  Take 1 tablet (600 mg total) by mouth 2 (two) times daily.   ipratropium-albuterol  0.5-2.5 (3) MG/3ML Soln Commonly known as: DUONEB Inhale 3 mLs into the lungs in the morning, at noon, in the evening, and at bedtime. What changed:  when to take this reasons to take this   levETIRAcetam  500 MG tablet Commonly known as: KEPPRA  Take 1 tablet (500 mg total) by mouth 2 (two) times daily. Follow with your outpatient provider for refills. What changed: additional instructions   pantoprazole  40 MG tablet Commonly known as: PROTONIX  Take 1 tablet (40 mg total) by mouth daily.   predniSONE  20 MG tablet Commonly known as: DELTASONE  Take 2 tablets (40 mg total) by mouth daily with breakfast for 5 days.   Symbicort 160-4.5 MCG/ACT inhaler Generic drug: budesonide -formoterol  1-2 puffs.       Allergies[1]    The results  of significant diagnostics from this hospitalization (including imaging, microbiology, ancillary and laboratory) are listed below for reference.    Significant Diagnostic Studies: CT Head Wo Contrast Result Date: 12/21/2023 EXAM: CT HEAD WITHOUT 12/21/2023 09:47:07 PM TECHNIQUE: CT of the head was performed without the administration of intravenous contrast. Automated exposure control, iterative reconstruction, and/or weight based adjustment of the mA/kV was utilized to reduce the radiation dose to as low as reasonably achievable. COMPARISON: 12/20/2023 CLINICAL  HISTORY: Seizure disorder, clinical change. FINDINGS: BRAIN AND VENTRICLES: No acute intracranial hemorrhage. No mass effect or midline shift. No extra-axial fluid collection. No evidence of acute infarct. ORBITS: No acute abnormality. SINUSES AND MASTOIDS: No acute abnormality. SOFT TISSUES AND SKULL: No acute skull fracture. No acute soft tissue abnormality. IMPRESSION: 1. No acute intracranial abnormality. Electronically signed by: Oneil Devonshire MD 12/21/2023 09:50 PM EST RP Workstation: [id number]   DG Chest Portable 1 View Result Date: 12/21/2023 EXAM: 1 VIEW(S) XRAY OF THE CHEST 12/21/2023 09:46:00 PM COMPARISON: 12/20/2023 CLINICAL HISTORY: sz FINDINGS: LUNGS AND PLEURA: Coarsened interstitial markings without pulmonary edema. No focal pulmonary opacity. No pleural effusion. No pneumothorax. HEART AND MEDIASTINUM: No acute abnormality of the cardiac and mediastinal silhouettes. BONES AND SOFT TISSUES: Cervical spine surgical hardware noted. No acute osseous abnormality. IMPRESSION: 1. Coarsened interstitial lung markings without pulmonary edema. Electronically signed by: Oneil Devonshire MD 12/21/2023 09:48 PM EST RP Workstation: [id number]   ECHOCARDIOGRAM COMPLETE Result Date: 12/21/2023    ECHOCARDIOGRAM REPORT   Patient Name:   ANELLA NAKATA Cabal Date of Exam: 12/21/2023 Medical Rec #:  [id number]        Height:       63.0 in Accession #:     [id number]       Weight:       128.0 lb Date of Birth:  Feb 17, 1965         BSA:          1.600 m Patient Age:    58 years         BP:           124/61 mmHg Patient Gender: F                HR:           75 bpm. Exam Location:  [facility] Procedure: 2D Echo, Cardiac Doppler and Color Doppler (Both Spectral and Color            Flow Doppler were utilized during procedure). Indications:    R55 Syncope  History:        Patient has prior history of Echocardiogram examinations, most                 recent 10/17/2021. COPD, Signs/Symptoms:Shortness of Breath and                 Dyspnea; Risk Factors:Hypertension and Dyslipidemia.  Sonographer:    Ellouise Mose RDCS Referring Phys: [id number] [facility] ADEFESO  Sonographer Comments: Suboptimal parasternal window. IMPRESSIONS  1. Left ventricular ejection fraction, by estimation, is 60 to 65%. Left ventricular ejection fraction by 2D MOD biplane is 66.1 %. Left ventricular ejection fraction by PLAX is 59 %. The left ventricle has normal function. The left ventricle has no regional wall motion abnormalities. Left ventricular diastolic parameters were normal.  2. Right ventricular systolic function is normal. The right ventricular size is normal. The estimated right ventricular systolic pressure is 9.9 mmHg.  3. The mitral valve is normal in structure. No evidence of mitral valve regurgitation. No evidence of mitral stenosis.  4. The inferior vena cava is normal in size with greater than 50% respiratory variability, suggesting right atrial pressure of 3 mmHg.  5. The aortic valve is calcified. Possiblefusion of the left and non-coronary cusps. Aortic valve regurgitation is not visualized. Aortic valve sclerosis/calcification is present, without any evidence of aortic stenosis. Aortic valve area, by VTI measures 2.64 cm. Aortic valve mean gradient measures 9.0 mmHg. Aortic  valve Vmax measures 2.03 m/s. Peak gradient 16.5 mmHg, DI 0.64. Based on DI, there does not appear to be  significant stenosis. Comparison(s): Changes from prior study are noted. 10/17/2021: LVEF 60-65%, mild AS - mean gradient 8.5 mmHg - however DI normal at 0.56. FINDINGS  Left Ventricle: Left ventricular ejection fraction, by estimation, is 60 to 65%. Left ventricular ejection fraction by PLAX is 59 %. Left ventricular ejection fraction by 2D MOD biplane is 66.1 %. The left ventricle has normal function. The left ventricle has no regional wall motion abnormalities. The left ventricular internal cavity size was normal in size. There is no left ventricular hypertrophy. Left ventricular diastolic parameters were normal. Right Ventricle: The right ventricular size is normal. No increase in right ventricular wall thickness. Right ventricular systolic function is normal. The tricuspid regurgitant velocity is 1.31 m/s, and with an assumed right atrial pressure of 3 mmHg, the estimated right ventricular systolic pressure is 9.9 mmHg. Left Atrium: Left atrial size was normal in size. Right Atrium: Right atrial size was normal in size. Pericardium: There is no evidence of pericardial effusion. Presence of epicardial fat layer. Mitral Valve: The mitral valve is normal in structure. No evidence of mitral valve regurgitation. No evidence of mitral valve stenosis. Tricuspid Valve: The tricuspid valve is normal in structure. Tricuspid valve regurgitation is not demonstrated. No evidence of tricuspid stenosis. Aortic Valve: Calcification of the valve- possible fusion of the left and non-coronary cusps. The aortic valve is calcified. There is mild calcification of the aortic valve. Aortic valve regurgitation is not visualized. Aortic valve sclerosis/calcification is present, without any evidence of aortic stenosis. Aortic valve mean gradient measures 9.0 mmHg. Aortic valve peak gradient measures 16.5 mmHg. Aortic valve area, by VTI measures 2.64 cm. Pulmonic Valve: The pulmonic valve was normal in structure. Pulmonic valve  regurgitation is not visualized. No evidence of pulmonic stenosis. Aorta: The aortic root and ascending aorta are structurally normal, with no evidence of dilitation. Venous: The right upper pulmonary vein is normal. The inferior vena cava is normal in size with greater than 50% respiratory variability, suggesting right atrial pressure of 3 mmHg. IAS/Shunts: The interatrial septum appears to be lipomatous. No atrial level shunt detected by color flow Doppler.  LEFT VENTRICLE PLAX 2D                        Biplane EF (MOD) LV EF:         Left            LV Biplane EF:   Left                ventricular                      ventricular                ejection                         ejection                fraction by                      fraction by                PLAX is 59  2D MOD                %.                               biplane is LVIDd:         4.50 cm                          66.1 %. LVIDs:         3.10 cm LV PW:         0.90 cm         Diastology LV IVS:        0.70 cm         LV e' medial:    9.90 cm/s LVOT diam:     2.30 cm         LV E/e' medial:  8.1 LV SV:         106             LV e' lateral:   9.68 cm/s LV SV Index:   66              LV E/e' lateral: 8.3 LVOT Area:     4.15 cm  LV Volumes (MOD) LV vol d, MOD    84.8 ml A2C: LV vol d, MOD    61.8 ml A4C: LV vol s, MOD    28.2 ml A2C: LV vol s, MOD    21.1 ml A4C: LV SV MOD A2C:   56.6 ml LV SV MOD A4C:   61.8 ml LV SV MOD BP:    50.7 ml RIGHT VENTRICLE            IVC RV S prime:     9.90 cm/s  IVC diam: 1.50 cm TAPSE (M-mode): 1.6 cm                            PULMONARY VEINS                            Diastolic Velocity: 49.30 cm/s                            S/D Velocity:       0.80                            Systolic Velocity:  39.00 cm/s LEFT ATRIUM           Index        RIGHT ATRIUM          Index LA diam:      1.80 cm 1.13 cm/m   RA Area:     8.41 cm LA Vol (A2C): 22.5 ml 14.07 ml/m  RA Volume:   14.80 ml 9.25 ml/m  LA Vol (A4C): 17.1 ml 10.69 ml/m  AORTIC VALVE AV Area (Vmax):    2.68 cm AV Area (Vmean):   2.63 cm AV Area (VTI):     2.64 cm AV Vmax:           203.25 cm/s AV Vmean:          135.500 cm/s AV VTI:  0.403 m AV Peak Grad:      16.5 mmHg AV Mean Grad:      9.0 mmHg LVOT Vmax:         131.00 cm/s LVOT Vmean:        85.900 cm/s LVOT VTI:          0.256 m LVOT/AV VTI ratio: 0.64  AORTA Ao Root diam: 2.80 cm MITRAL VALVE               TRICUSPID VALVE MV Area (PHT): 4.36 cm    TR Peak grad:   6.9 mmHg MV Decel Time: 174 msec    TR Vmax:        131.00 cm/s MV E velocity: 80.65 cm/s MV Jireh Vinas velocity: 82.95 cm/s  SHUNTS MV E/Chinonso Linker ratio:  0.97        Systemic VTI:  0.26 m                            Systemic Diam: 2.30 cm Vinie Maxcy MD Electronically signed by Vinie Maxcy MD Signature Date/Time: 12/21/2023/12:35:31 PM    Final    US  Carotid Bilateral Result Date: 12/21/2023 CLINICAL DATA:  Syncope, orthostatic EXAM: BILATERAL CAROTID DUPLEX ULTRASOUND TECHNIQUE: Elnor scale imaging, color Doppler and duplex ultrasound were performed of bilateral carotid and vertebral arteries in the neck. COMPARISON:  None Available. FINDINGS: Criteria: Quantification of carotid stenosis is based on velocity parameters that correlate the residual internal carotid diameter with NASCET-based stenosis levels, using the diameter of the distal internal carotid lumen as the denominator for stenosis measurement. The following velocity measurements were obtained: RIGHT ICA: 82/33 cm/sec CCA: 56/13 cm/sec SYSTOLIC ICA/CCA RATIO:  1.5 ECA:  69 cm/sec LEFT ICA: 83/33 cm/sec CCA: 70/18 cm/sec SYSTOLIC ICA/CCA RATIO:  1.2 ECA:  99 cm/sec RIGHT CAROTID ARTERY: Heterogeneous atherosclerotic plaque in the common and proximal internal carotid artery. By peak systolic velocity criteria, the estimated stenosis is less than 50%. RIGHT VERTEBRAL ARTERY:  Patent with normal antegrade flow. LEFT CAROTID ARTERY: Heterogeneous atherosclerotic plaque  in the proximal internal carotid artery. By peak systolic velocity criteria, the estimated stenosis is less than 50%. LEFT VERTEBRAL ARTERY:  Patent with normal antegrade flow. IMPRESSION: 1. Mild (1-49%) stenosis proximal right internal carotid artery secondary to heterogenous atherosclerotic plaque. 2. Mild (1-49%) stenosis proximal left internal carotid artery secondary to heterogenous atherosclerotic plaque. 3. Vertebral arteries are patent with normal antegrade flow. Electronically Signed   By: Wilkie Lent M.D.   On: 12/21/2023 09:42   CT Head Wo Contrast Result Date: 12/20/2023 EXAM: CT HEAD WITHOUT 12/20/2023 06:11:50 PM TECHNIQUE: CT of the head was performed without the administration of intravenous contrast. Automated exposure control, iterative reconstruction, and/or weight based adjustment of the mA/kV was utilized to reduce the radiation dose to as low as reasonably achievable. COMPARISON: None available. CLINICAL HISTORY: fall FINDINGS: BRAIN AND VENTRICLES: No acute intracranial hemorrhage. No mass effect or midline shift. No extra-axial fluid collection. No evidence of acute infarct. No hydrocephalus. Subcortical and periventricular small vessel ischemic changes. ORBITS: No acute abnormality. SINUSES AND MASTOIDS: No acute abnormality. SOFT TISSUES AND SKULL: No acute skull fracture. No acute soft tissue abnormality. IMPRESSION: 1. No acute intracranial abnormality. Electronically signed by: Pinkie Pebbles MD 12/20/2023 07:10 PM EST RP Workstation: [id number]   DG Chest 2 View Result Date: 12/20/2023 EXAM: 2 VIEW(S) XRAY OF THE CHEST 12/20/2023 05:03:46 PM COMPARISON: 12/06/2023 CLINICAL HISTORY: SOB FINDINGS: LUNGS AND PLEURA: No  focal pulmonary opacity. No pleural effusion. No pneumothorax. HEART AND MEDIASTINUM: No acute abnormality of the cardiac and mediastinal silhouettes. BONES AND SOFT TISSUES: Lower cervical spine hardware noted. No acute osseous abnormality. IMPRESSION: 1.  No acute cardiopulmonary process. Electronically signed by: Pinkie Pebbles MD 12/20/2023 06:06 PM EST RP Workstation: [id number]   DG Chest 2 View Result Date: 12/06/2023 EXAM: 2 VIEW(S) XRAY OF THE CHEST 12/06/2023 07:16:21 PM COMPARISON: 12/04/2023 CLINICAL HISTORY: SOB FINDINGS: LUNGS AND PLEURA: New linear opacity in right middle lobe. No pleural effusion. No pneumothorax. HEART AND MEDIASTINUM: No acute abnormality of the cardiac and mediastinal silhouettes. BONES AND SOFT TISSUES: Hardware in lower cervical spine partially visualized. No acute osseous abnormality. IMPRESSION: 1. New linear opacity in the right middle lobe, which may reflect atelectasis or early pneumonia. Recommend follow up in 8 - 10 weeks to ensure resolution. Electronically signed by: Norman Gatlin MD 12/06/2023 07:22 PM EST RP Workstation: [id number]   DG Chest Portable 1 View Result Date: 12/04/2023 EXAM: 1 VIEW(S) XRAY OF THE CHEST 12/04/2023 05:55:00 AM COMPARISON: [facility] radiograph of the chest dated 12/25/2022. CLINICAL HISTORY: eval for pneumonia FINDINGS: LUNGS AND PLEURA: No focal pulmonary opacity. No pleural effusion. No pneumothorax. HEART AND MEDIASTINUM: No acute abnormality of the cardiac and mediastinal silhouettes. BONES AND SOFT TISSUES: Cervical fixation hardware in place. No acute osseous abnormality. IMPRESSION: 1. No acute process. Electronically signed by: Evalene Coho MD 12/04/2023 06:05 AM EST RP Workstation: [id number]    Microbiology: Recent Results (from the past 240 hours)  Resp panel by RT-PCR (RSV, Flu Jossilyn Benda&B, Covid) Anterior Nasal Swab     Status: None   Collection Time: 12/20/23  7:05 PM   Specimen: Anterior Nasal Swab  Result Value Ref Range Status   SARS Coronavirus 2 by RT PCR NEGATIVE NEGATIVE Final    Comment: (NOTE) SARS-CoV-2 target nucleic acids are NOT DETECTED.  The SARS-CoV-2 RNA is generally detectable in upper respiratory specimens during the acute phase of infection. The  lowest concentration of SARS-CoV-2 viral copies this assay can detect is 138 copies/mL. Rahshawn Remo negative result does not preclude SARS-Cov-2 infection and should not be used as the sole basis for treatment or other patient management decisions. Neel Buffone negative result may occur with  improper specimen collection/handling, submission of specimen other than nasopharyngeal swab, presence of viral mutation(s) within the areas targeted by this assay, and inadequate number of viral copies(<138 copies/mL). Shadae Reino negative result must be combined with clinical observations, patient history, and epidemiological information. The expected result is Negative.  Fact Sheet for Patients:  bloggercourse.com  Fact Sheet for Healthcare Providers:  seriousbroker.it  This test is no t yet approved or cleared by the [place]  FDA and  has been authorized for detection and/or diagnosis of SARS-CoV-2 by FDA under an Emergency Use Authorization (EUA). This EUA will remain  in effect (meaning this test can be used) for the duration of the COVID-19 declaration under Section 564(b)(1) of the Act, 21 U.S.C.section 360bbb-3(b)(1), unless the authorization is terminated  or revoked sooner.       Influenza Babe Anthis by PCR NEGATIVE NEGATIVE Final   Influenza B by PCR NEGATIVE NEGATIVE Final    Comment: (NOTE) The Xpert Xpress SARS-CoV-2/FLU/RSV plus assay is intended as an aid in the diagnosis of influenza from Nasopharyngeal swab specimens and should not be used as Roshaunda Starkey sole basis for treatment. Nasal washings and aspirates are unacceptable for Xpert Xpress SARS-CoV-2/FLU/RSV testing.  Fact Sheet for Patients: bloggercourse.com  Fact Sheet for Healthcare Providers:  seriousbroker.it  This test is not yet approved or cleared by the [place]  FDA and has been authorized for detection and/or diagnosis of SARS-CoV-2 by FDA under  an Emergency Use Authorization (EUA). This EUA will remain in effect (meaning this test can be used) for the duration of the COVID-19 declaration under Section 564(b)(1) of the Act, 21 U.S.C. section 360bbb-3(b)(1), unless the authorization is terminated or revoked.     Resp Syncytial Virus by PCR NEGATIVE NEGATIVE Final    Comment: (NOTE) Fact Sheet for Patients: bloggercourse.com  Fact Sheet for Healthcare Providers: seriousbroker.it  This test is not yet approved or cleared by the [place]  FDA and has been authorized for detection and/or diagnosis of SARS-CoV-2 by FDA under an Emergency Use Authorization (EUA). This EUA will remain in effect (meaning this test can be used) for the duration of the COVID-19 declaration under Section 564(b)(1) of the Act, 21 U.S.C. section 360bbb-3(b)(1), unless the authorization is terminated or revoked.  Performed at [facility], [address], [place], [place] [address]   Resp panel by RT-PCR (RSV, Flu Zan Orlick&B, Covid) Urine, Clean Catch     Status: None   Collection Time: 12/21/23  9:29 PM   Specimen: Urine, Clean Catch; Nasal Swab  Result Value Ref Range Status   SARS Coronavirus 2 by RT PCR NEGATIVE NEGATIVE Final   Influenza Pearly Bartosik by PCR NEGATIVE NEGATIVE Final   Influenza B by PCR NEGATIVE NEGATIVE Final    Comment: (NOTE) The Xpert Xpress SARS-CoV-2/FLU/RSV plus assay is intended as an aid in the diagnosis of influenza from Nasopharyngeal swab specimens and should not be used as Izaih Kataoka sole basis for treatment. Nasal washings and aspirates are unacceptable for Xpert Xpress SARS-CoV-2/FLU/RSV testing.  Fact Sheet for Patients: bloggercourse.com  Fact Sheet for Healthcare Providers: seriousbroker.it  This test is not yet approved or cleared by the [place]  FDA and has been authorized for detection and/or diagnosis of SARS-CoV-2 by FDA  under an Emergency Use Authorization (EUA). This EUA will remain in effect (meaning this test can be used) for the duration of the COVID-19 declaration under Section 564(b)(1) of the Act, 21 U.S.C. section 360bbb-3(b)(1), unless the authorization is terminated or revoked.     Resp Syncytial Virus by PCR NEGATIVE NEGATIVE Final    Comment: (NOTE) Fact Sheet for Patients: bloggercourse.com  Fact Sheet for Healthcare Providers: seriousbroker.it  This test is not yet approved or cleared by the [place]  FDA and has been authorized for detection and/or diagnosis of SARS-CoV-2 by FDA under an Emergency Use Authorization (EUA). This EUA will remain in effect (meaning this test can be used) for the duration of the COVID-19 declaration under Section 564(b)(1) of the Act, 21 U.S.C. section 360bbb-3(b)(1), unless the authorization is terminated or revoked.  Performed at [facility], [address], [place], [place] [address]      Labs: Basic Metabolic Panel: Recent Labs  Lab 12/20/23 1657 12/21/23 2127 12/21/23 2132 12/22/23 0514  NA 141 138 139 138  K 3.9 4.3 4.3 4.8  CL 104 103 104 102  CO2 25 26  --  25  GLUCOSE 87 138* 135* 110*  BUN 9 15 21* 12  CREATININE 0.68 0.88 0.80 0.72  0.69  CALCIUM  9.0 9.2  --  8.4*  MG  --  2.0  --   --    Liver Function Tests: Recent Labs  Lab 12/21/23 2127  AST 29  ALT 11  ALKPHOS 158*  BILITOT 1.1  PROT 6.9  ALBUMIN 3.8   No results for input(s): LIPASE, AMYLASE in the last 168 hours. No results for input(s): AMMONIA in the last 168 hours. CBC: Recent Labs  Lab 12/20/23 1657 12/21/23 2127 12/21/23 2132 12/22/23 0514  WBC 11.0* 18.6*  --  11.9*  NEUTROABS  --  15.6*  --   --   HGB 14.2 13.8 15.3* 12.2  HCT 45.4 43.4 45.0 38.0  MCV 87.1 87.0  --  85.6  PLT 279 351  --  285   Cardiac Enzymes: No results for input(s): CKTOTAL, CKMB, CKMBINDEX,  TROPONINI in the last 168 hours. BNP: BNP (last 3 results) No results for input(s): BNP in the last 8760 hours.  ProBNP (last 3 results) Recent Labs    12/06/23 1839 12/20/23 1807  PROBNP 55.9 <50.0    CBG: No results for input(s): GLUCAP in the last 168 hours.     Signed:  Meliton Monte MD.  [facility] 12/22/2023, 3:43 PM       [1]  Allergies Allergen Reactions   Aspirin Anaphylaxis, Shortness Of Breath, Swelling and Other (See Comments)    Swelling of tongue and throat   Bee Pollen Anaphylaxis and Other (See Comments)    STOPS BREATHING   Bee Venom Anaphylaxis   Haloperidol Anaphylaxis   Haloperidol Lactate Shortness Of Breath   Latex Shortness Of Breath and Swelling   Erythromycin Hives and Other (See Comments)    Stomach cramps

## 2023-12-22 NOTE — ED Notes (Signed)
Called for bed

## 2023-12-22 NOTE — Progress Notes (Signed)
 NEUROLOGY CONSULT FOLLOW UP NOTE   Date of service: December 22, 2023 Patient Name: Alexandra Sanchez MRN:  [id number] DOB:  [birth year]  Interval Hx/subjective   NO family at the bedside. Patient being connected to LTM Patient states her bilateral hands and feet feel hot. She also is requesting a cigarette.   Vitals   Vitals:   12/22/23 0615 12/22/23 0700 12/22/23 0729 12/22/23 0730  BP: 92/69 116/69  101/62  Pulse: 70 76  67  Resp: 19 12  15   Temp:   97.8 F (36.6 C)   TempSrc:   Oral   SpO2: 95% 97%  100%     There is no height or weight on file to calculate BMI.  Physical Exam   Constitutional: Appears well-developed and well-nourished.   Psych: Affect appropriate to situation.   Eyes: No scleral injection.   HENT: No OP obstrucion.   Head: Normocephalic.   Cardiovascular: Normal rate and regular rhythm.   Respiratory: Effort normal, non-labored breathing.   GI: Soft.  No distension. There is no tenderness.   Skin: WDI.    Neurologic Examination   Mental Status -  Level of arousal and orientation to time, place, and person were intact. Language including expression, naming, repetition, comprehension was assessed and found intact. Attention span and concentration were normal. Recent and remote memory were intact. Fund of Knowledge was assessed and was intact.  Cranial Nerves II - XII - II - Visual field intact OU. III, IV, VI - Extraocular movements intact. V - Facial sensation intact bilaterally. VII - Facial movement intact bilaterally. VIII - Hearing & vestibular intact bilaterally. X - Palate elevates symmetrically. XI - Chin turning & shoulder shrug intact bilaterally. XII - Tongue protrusion intact.  Motor Strength - The patients strength was normal in all extremities and pronator drift was absent.  Bulk was normal and fasciculations were absent.   Motor Tone - Muscle tone was assessed at the neck and appendages and was normal. Sensory - Light touch,  temperature/pinprick were assessed and were symmetrical.   Coordination - The patient had normal movements in the hands and feet with no ataxia or dysmetria.  Tremor was absent.  Gait and Station - deferred.  Medications Current Medications[1]  Labs and Diagnostic Imaging   CBC:  Recent Labs  Lab 12/21/23 2127 12/21/23 2132 12/22/23 0514  WBC 18.6*  --  11.9*  NEUTROABS 15.6*  --   --   HGB 13.8 15.3* 12.2  HCT 43.4 45.0 38.0  MCV 87.0  --  85.6  PLT 351  --  285    Basic Metabolic Panel:  Lab Results  Component Value Date   NA 138 12/22/2023   K 4.8 12/22/2023   CO2 25 12/22/2023   GLUCOSE 110 (H) 12/22/2023   BUN 12 12/22/2023   CREATININE 0.72 12/22/2023   CREATININE 0.69 12/22/2023   CALCIUM  8.4 (L) 12/22/2023   GFRNONAA >60 12/22/2023   GFRNONAA >60 12/22/2023   GFRAA >60 08/26/2019   Lipid Panel:  Lab Results  Component Value Date   LDLCALC 86 02/07/2022   HgbA1c:  Lab Results  Component Value Date   HGBA1C 5.2 02/07/2022   Urine Drug Screen:     Component Value Date/Time   LABOPIA NONE DETECTED 12/21/2023 2127   COCAINSCRNUR NONE DETECTED 12/21/2023 2127   LABBENZ NONE DETECTED 12/21/2023 2127   AMPHETMU NONE DETECTED 12/21/2023 2127   THCU POSITIVE (A) 12/21/2023 2127   LABBARB NONE DETECTED 12/21/2023 2127  Alcohol Level     Component Value Date/Time   [facility] <15 12/21/2023 2127   INR  Lab Results  Component Value Date   INR 1.0 03/19/2020   APTT  Lab Results  Component Value Date   APTT 31 03/19/2020    CT Head without contrast(Personally reviewed): No acute process  cEEG:  Ordered    Labs:  UDS positive for THC  WBC 18.6   Assessment   Alexandra Sanchez is a 58 y.o. female  past medical history of  hypertension, hyperlipidemia, COPD with exacerbation and epilepsy, presenting with recurrent seizures. Home medications include Keppra  per Epic list, but patient states that she has not taken this for about 1 year. She was  loaded with 3000 mg IV Keppra  in the ED.  She has had evaluations for seizures in the past based on care everywhere another note reviews, with recommendations to get video EEG for spell characterization due to the fact that her seizure episodes are atypical and frequency is higher than what would be expected with medication compliance-that has not happened due to various reasons including insurance authorizations not going through.  Due to her increasing frequency of seizures, it would be prudent to watch her on EEG for at least 24 hours with hope to capture a spell and characterize.  - Impression:  - Epilepsy - Recurrent seizures with increasing frequency over the past year due to medication noncompliance.  - COPD with wet cough currently. Given that some of her spells occur after coughing with possible tussive syncope, a subset of her seizures may be due to convulsive syncope  Recommendations  - Seizure precautions  - Continue Keppra  500 mg BID  - LTM for 24 hours -Seizure precautions -Outpatient neurology follow-up - COPD management per primary team Plan discussed with Dr. Perri CURB PRECAUTIONS Per [place]  DMV statutes, patients with seizures are not allowed to drive until they have been seizure-free for six months.   Use caution when using heavy equipment or power tools. Avoid working on ladders or at heights. Take showers instead of baths. Ensure the water temperature is not too high on the home water heater. Do not go swimming alone. Do not lock yourself in a room alone (i.e. bathroom). When caring for infants or small children, sit down when holding, feeding, or changing them to minimize risk of injury to the child in the event you have a seizure. Maintain good sleep hygiene. Avoid alcohol.    If patient has another seizure, call 911 and bring them back to the ED if: A.  The seizure lasts longer than 5 minutes.      B.  The patient doesn't wake shortly after the seizure  or has new problems such as difficulty seeing, speaking or moving following the seizure C.  The patient was injured during the seizure D.  The patient has a temperature over 102 F (39C) E.  The patient vomited during the seizure and now is having trouble breathing ______________________________________________________________________   Signed, Karna DELENA Geralds, NP [facility]  Attending Neurohospitalist Addendum Patient seen and examined with APP/Resident. Agree with the history and physical as documented above. Agree with the plan as documented, which I helped formulate. I have independently reviewed the chart, obtained history, review of systems and examined the patient.I have personally reviewed pertinent head/neck/spine imaging (CT/MRI). Please feel free to call with any questions.  -- Eligio Lav, MD Neurologist [facility] Pager: [phone]      [1]  Current  Facility-Administered Medications:    0.9 %  sodium chloride  infusion, 250 mL, Intravenous, PRN, Sundil, Subrina, MD   acetaminophen  (TYLENOL ) tablet 650 mg, 650 mg, Oral, Q6H PRN, 650 mg at 12/22/23 0731 **OR** acetaminophen  (TYLENOL ) suppository 650 mg, 650 mg, Rectal, Q6H PRN, Sundil, Subrina, MD   albuterol  (PROVENTIL ) (2.5 MG/3ML) 0.083% nebulizer solution 2.5 mg, 2.5 mg, Nebulization, Q2H PRN, Perri DELENA Meliton Mickey., MD   arformoterol  (BROVANA ) nebulizer solution 15 mcg, 15 mcg, Nebulization, BID, Perri DELENA Meliton Mickey., MD   budesonide  (PULMICORT ) nebulizer solution 0.25 mg, 0.25 mg, Nebulization, BID, Perri DELENA Meliton Mickey., MD   cefTRIAXone  (ROCEPHIN ) 1 g in sodium chloride  0.9 % 100 mL IVPB, 1 g, Intravenous, QHS, Sundil, Subrina, MD, Stopped at 12/22/23 0136   enoxaparin  (LOVENOX ) injection 40 mg, 40 mg, Subcutaneous, Daily, Sundil, Subrina, MD   ipratropium-albuterol  (DUONEB) 0.5-2.5 (3) MG/3ML nebulizer solution 3 mL, 3 mL, Nebulization, Q6H, Sundil, Subrina, MD, 3 mL at 12/22/23  0732   levETIRAcetam  (KEPPRA ) undiluted injection 500 mg, 500 mg, Intravenous, BID, Sundil, Subrina, MD   LORazepam  (ATIVAN ) injection 2 mg, 2 mg, Intravenous, Q6H PRN, Sundil, Subrina, MD   ondansetron  (ZOFRAN ) 4 MG/2ML injection, , , ,    ondansetron  (ZOFRAN ) tablet 4 mg, 4 mg, Oral, Q6H PRN **OR** ondansetron  (ZOFRAN ) injection 4 mg, 4 mg, Intravenous, Q6H PRN, Sundil, Subrina, MD   [START ON 12/23/2023] predniSONE  (DELTASONE ) tablet 40 mg, 40 mg, Oral, Q breakfast, Perri, DELENA Meliton Mickey., MD   sodium chloride  flush (NS) 0.9 % injection 3 mL, 3 mL, Intravenous, Q12H, Sundil, Subrina, MD   sodium chloride  flush (NS) 0.9 % injection 3 mL, 3 mL, Intravenous, PRN, Sundil, Subrina, MD  Current Outpatient Medications:    albuterol  (VENTOLIN  HFA) 108 (90 Base) MCG/ACT inhaler, Inhale 2 puffs into the lungs every 4 (four) hours as needed for wheezing or shortness of breath., Disp: 1 each, Rfl: 3   amLODipine  (NORVASC ) 5 MG tablet, Take 1 tablet (5 mg total) by mouth daily., Disp: 30 tablet, Rfl: 5   atorvastatin  (LIPITOR) 40 MG tablet, Take 1 tablet (40 mg total) by mouth daily., Disp: 30 tablet, Rfl: 5   budesonide -formoterol  (SYMBICORT) 160-4.5 MCG/ACT inhaler, 1-2 puffs., Disp: , Rfl:    ipratropium-albuterol  (DUONEB) 0.5-2.5 (3) MG/3ML SOLN, Inhale 3 mLs into the lungs in the morning, at noon, in the evening, and at bedtime. (Patient taking differently: Inhale 3 mLs into the lungs every 6 (six) hours as needed (SOB and wheezing).), Disp: 360 mL, Rfl: 2   doxycycline  (VIBRAMYCIN ) 100 MG capsule, Take 1 capsule (100 mg total) by mouth 2 (two) times daily for 5 days. (Patient not taking: Reported on 12/22/2023), Disp: 10 capsule, Rfl: 0   guaiFENesin  (MUCINEX ) 600 MG 12 hr tablet, Take 1 tablet (600 mg total) by mouth 2 (two) times daily. (Patient not taking: Reported on 12/22/2023), Disp: 20 tablet, Rfl: 0   levETIRAcetam  (KEPPRA ) 500 MG tablet, Take 500 mg by mouth 2 (two) times daily. (Patient not  taking: Reported on 12/22/2023), Disp: , Rfl:    pantoprazole  (PROTONIX ) 40 MG tablet, Take 1 tablet (40 mg total) by mouth daily. (Patient not taking: Reported on 12/22/2023), Disp: 30 tablet, Rfl: 3   predniSONE  (DELTASONE ) 20 MG tablet, Take 2 tablets (40 mg total) by mouth daily with breakfast for 5 days. (Patient not taking: Reported on 12/22/2023), Disp: 10 tablet, Rfl: 0

## 2023-12-23 ENCOUNTER — Other Ambulatory Visit: Payer: Self-pay

## 2023-12-23 ENCOUNTER — Observation Stay (HOSPITAL_COMMUNITY)
Admission: EM | Admit: 2023-12-23 | Discharge: 2023-12-25 | DRG: 101 | Disposition: A | Payer: MEDICAID | Attending: Family Medicine | Admitting: Family Medicine

## 2023-12-23 ENCOUNTER — Emergency Department (HOSPITAL_COMMUNITY): Payer: MEDICAID

## 2023-12-23 ENCOUNTER — Encounter (HOSPITAL_COMMUNITY): Payer: Self-pay

## 2023-12-23 DIAGNOSIS — Z833 Family history of diabetes mellitus: Secondary | ICD-10-CM

## 2023-12-23 DIAGNOSIS — Z91030 Bee allergy status: Secondary | ICD-10-CM

## 2023-12-23 DIAGNOSIS — I10 Essential (primary) hypertension: Secondary | ICD-10-CM | POA: Diagnosis present

## 2023-12-23 DIAGNOSIS — Z9049 Acquired absence of other specified parts of digestive tract: Secondary | ICD-10-CM

## 2023-12-23 DIAGNOSIS — Z1152 Encounter for screening for COVID-19: Secondary | ICD-10-CM

## 2023-12-23 DIAGNOSIS — Z91040 Latex allergy status: Secondary | ICD-10-CM

## 2023-12-23 DIAGNOSIS — F17210 Nicotine dependence, cigarettes, uncomplicated: Secondary | ICD-10-CM | POA: Diagnosis present

## 2023-12-23 DIAGNOSIS — G40909 Epilepsy, unspecified, not intractable, without status epilepticus: Principal | ICD-10-CM | POA: Diagnosis present

## 2023-12-23 DIAGNOSIS — R569 Unspecified convulsions: Secondary | ICD-10-CM

## 2023-12-23 DIAGNOSIS — T426X6A Underdosing of other antiepileptic and sedative-hypnotic drugs, initial encounter: Secondary | ICD-10-CM | POA: Diagnosis present

## 2023-12-23 DIAGNOSIS — J441 Chronic obstructive pulmonary disease with (acute) exacerbation: Secondary | ICD-10-CM | POA: Diagnosis present

## 2023-12-23 DIAGNOSIS — Z7951 Long term (current) use of inhaled steroids: Secondary | ICD-10-CM

## 2023-12-23 DIAGNOSIS — Z881 Allergy status to other antibiotic agents status: Secondary | ICD-10-CM

## 2023-12-23 DIAGNOSIS — Z886 Allergy status to analgesic agent status: Secondary | ICD-10-CM

## 2023-12-23 DIAGNOSIS — Z79899 Other long term (current) drug therapy: Secondary | ICD-10-CM

## 2023-12-23 DIAGNOSIS — J209 Acute bronchitis, unspecified: Secondary | ICD-10-CM | POA: Diagnosis present

## 2023-12-23 DIAGNOSIS — E785 Hyperlipidemia, unspecified: Secondary | ICD-10-CM | POA: Diagnosis present

## 2023-12-23 DIAGNOSIS — Z888 Allergy status to other drugs, medicaments and biological substances status: Secondary | ICD-10-CM

## 2023-12-23 DIAGNOSIS — Z91148 Patient's other noncompliance with medication regimen for other reason: Secondary | ICD-10-CM

## 2023-12-23 DIAGNOSIS — Z90710 Acquired absence of both cervix and uterus: Secondary | ICD-10-CM

## 2023-12-23 DIAGNOSIS — G40919 Epilepsy, unspecified, intractable, without status epilepticus: Principal | ICD-10-CM

## 2023-12-23 LAB — CBC WITH DIFFERENTIAL/PLATELET
Abs Immature Granulocytes: 0.03 K/uL (ref 0.00–0.07)
Basophils Absolute: 0 K/uL (ref 0.0–0.1)
Basophils Relative: 0 %
Eosinophils Absolute: 0.1 K/uL (ref 0.0–0.5)
Eosinophils Relative: 1 %
HCT: 45 % (ref 36.0–46.0)
Hemoglobin: 14.4 g/dL (ref 12.0–15.0)
Immature Granulocytes: 0 %
Lymphocytes Relative: 26 %
Lymphs Abs: 2.8 K/uL (ref 0.7–4.0)
MCH: 27.4 pg (ref 26.0–34.0)
MCHC: 32 g/dL (ref 30.0–36.0)
MCV: 85.7 fL (ref 80.0–100.0)
Monocytes Absolute: 0.7 K/uL (ref 0.1–1.0)
Monocytes Relative: 7 %
Neutro Abs: 7.3 K/uL (ref 1.7–7.7)
Neutrophils Relative %: 66 %
Platelets: 304 K/uL (ref 150–400)
RBC: 5.25 MIL/uL — ABNORMAL HIGH (ref 3.87–5.11)
RDW: 14.2 % (ref 11.5–15.5)
WBC: 11 K/uL — ABNORMAL HIGH (ref 4.0–10.5)
nRBC: 0 % (ref 0.0–0.2)

## 2023-12-23 LAB — COMPREHENSIVE METABOLIC PANEL WITH GFR
ALT: 15 U/L (ref 0–44)
AST: 23 U/L (ref 15–41)
Albumin: 3.6 g/dL (ref 3.5–5.0)
Alkaline Phosphatase: 139 U/L — ABNORMAL HIGH (ref 38–126)
Anion gap: 11 (ref 5–15)
BUN: 10 mg/dL (ref 6–20)
CO2: 27 mmol/L (ref 22–32)
Calcium: 8.9 mg/dL (ref 8.9–10.3)
Chloride: 101 mmol/L (ref 98–111)
Creatinine, Ser: 0.7 mg/dL (ref 0.44–1.00)
GFR, Estimated: >60 mL/min (ref >60)
Glucose, Bld: 92 mg/dL (ref 70–99)
Potassium: 3.6 mmol/L (ref 3.5–5.1)
Sodium: 139 mmol/L (ref 135–145)
Total Bilirubin: 0.5 mg/dL (ref 0.0–1.2)
Total Protein: 6.3 g/dL — ABNORMAL LOW (ref 6.5–8.1)

## 2023-12-23 LAB — CBC
HCT: 43.8 % (ref 36.0–46.0)
Hemoglobin: 13.7 g/dL (ref 12.0–15.0)
MCH: 26.8 pg (ref 26.0–34.0)
MCHC: 31.3 g/dL (ref 30.0–36.0)
MCV: 85.7 fL (ref 80.0–100.0)
Platelets: 292 K/uL (ref 150–400)
RBC: 5.11 MIL/uL (ref 3.87–5.11)
RDW: 14 % (ref 11.5–15.5)
WBC: 9.9 K/uL (ref 4.0–10.5)
nRBC: 0 % (ref 0.0–0.2)

## 2023-12-23 LAB — CREATININE, SERUM
Creatinine, Ser: 0.65 mg/dL (ref 0.44–1.00)
GFR, Estimated: >60 mL/min (ref >60)

## 2023-12-23 LAB — ETHANOL: Alcohol, Ethyl (B): <15 mg/dL (ref <15)

## 2023-12-23 LAB — CBG MONITORING, ED: Glucose-Capillary: 103 mg/dL — ABNORMAL HIGH (ref 70–99)

## 2023-12-23 LAB — MAGNESIUM: Magnesium: 1.8 mg/dL (ref 1.7–2.4)

## 2023-12-23 LAB — LEVETIRACETAM LEVEL: Levetiracetam Lvl: 2.8 ug/mL — ABNORMAL LOW (ref 10.0–40.0)

## 2023-12-23 LAB — TROPONIN I (HIGH SENSITIVITY): Troponin I (High Sensitivity): 5 ng/L (ref <18)

## 2023-12-23 MED ORDER — BUDESONIDE 0.25 MG/2ML IN SUSP
0.2500 mg | Freq: Two times a day (BID) | RESPIRATORY_TRACT | Status: DC
  Administered 2023-12-23 – 2023-12-25 (×4): 0.25 mg via RESPIRATORY_TRACT
  Filled 2023-12-23 (×4): qty 2

## 2023-12-23 MED ORDER — IPRATROPIUM-ALBUTEROL 0.5-2.5 (3) MG/3ML IN SOLN
3.0000 mL | RESPIRATORY_TRACT | Status: DC
  Administered 2023-12-23 – 2023-12-24 (×3): 3 mL via RESPIRATORY_TRACT
  Filled 2023-12-23 (×3): qty 3

## 2023-12-23 MED ORDER — IPRATROPIUM-ALBUTEROL 0.5-2.5 (3) MG/3ML IN SOLN: 3.0000 mL | RESPIRATORY_TRACT | Status: DC | PRN

## 2023-12-23 MED ORDER — ENOXAPARIN SODIUM 40 MG/0.4ML IJ SOSY
40.0000 mg | PREFILLED_SYRINGE | INTRAMUSCULAR | Status: DC
  Administered 2023-12-24 – 2023-12-25 (×2): 40 mg via SUBCUTANEOUS
  Filled 2023-12-23 (×2): qty 0.4

## 2023-12-23 MED ORDER — LEVETIRACETAM (KEPPRA) 500 MG/5 ML ADULT IV PUSH
1000.0000 mg | Freq: Once | INTRAVENOUS | Status: AC
  Administered 2023-12-23: 19:00:00 1000 mg via INTRAVENOUS
  Filled 2023-12-23: qty 10

## 2023-12-23 MED ORDER — LEVETIRACETAM (KEPPRA) 500 MG/5 ML ADULT IV PUSH
1000.0000 mg | Freq: Once | INTRAVENOUS | Status: AC
  Administered 2023-12-23: 20:00:00 1000 mg via INTRAVENOUS
  Filled 2023-12-23: qty 10

## 2023-12-23 NOTE — ED Triage Notes (Signed)
 Quick triage note. Pt to ED via POV c/o witnessed seizure activity by spouse this morning and afternoon. Reports hx of seizures. Pt was seen for the same on 12/13, left hospital yesterday AMA

## 2023-12-23 NOTE — ED Notes (Signed)
 Placed pt on 3L 02 per [place]

## 2023-12-23 NOTE — ED Triage Notes (Addendum)
 Pt states, I feel drunk. Pt c/o dizziness, SOB, HA, lower back pain. Pt states I feel like I'm going to pass out. Pt c/o [id number]. Pt woke up with dizziness at 0900 today. Pt's LKW 0030 today

## 2023-12-23 NOTE — Telephone Encounter (Signed)
 Copied from CRM #[id number]. Topic: Scheduling - Cancel/Reschedule >> Dec 23, 2023  1:31 PM Ashby T wrote: Cancel  Additional Requests/Needs: Yes Red Word Declined: I offered patient appointment: Yes- Patient declined. Patient was offered triage based on red word: Seizure- within the last hour. patient declined triage for the following reason other-going to ED. Patient is notified of options: can call back to speak with triage nurse, can seek services for medical care.  Message is routed to clinical staff and team lead as high priority per protocol.

## 2023-12-23 NOTE — Telephone Encounter (Signed)
 Upcoming Appt: Future Appointments  Date Time Provider Department Center  12/23/2023  2:40 PM Spencer, Lorenzo Johnathan, MD [facility]    Disposition: Go to ED Now  Triage documentation reviewed and Disposition present? yes  Encounter Reason for Disposition:   Difficulty breathing   Is this a pediatric patient?  No   Any recent, relevant visit?  Yes   Date/location of recent visit? 12/22/23 [facility]  Any related medications? Yes   Name of medication and dose? Seizure Med Vitamin B 12 Doxycycline  LEFT AMA  Any relevant medical history?  Yes   What history? Epilepsy, COPDUpcoming Appt: Future Appointments  Date Time Provider Department Center  12/23/2023  2:40 PM Spencer, Lorenzo Johnathan, MD [facility]    Disposition: Go to ED Now  Triage documentation reviewed and Disposition present? yes  Encounter Reason for Disposition:   Difficulty breathing   Is this a pediatric patient?  No   Any recent, relevant visit?  No  Any relevant medical history?  No  Any interventions?  No   [facility] Eligibility  Patient is eligible for [facility] (according to most recent zip code and insurance information)?  No  Patient is [facility] eligible and appropriate for referral: No   Encounter Initial Assessment: 1. DESCRIPTION: Describe your dizziness.     Room is spinning 2. LIGHTHEADED: Do you feel lightheaded? (e.g., somewhat faint, woozy, weak upon standing)     Somewhat faint, a little weak upon standing 3. VERTIGO: Do you feel like either you or the room is spinning or tilting? (i.e., vertigo)     Yes, room spinning at times 4. SEVERITY: How bad is it?  Do you feel like you are going to faint? Can you stand and walk?     Sitting feels like she would faint-unable to walk unaided 5. ONSET:  When did the dizziness begin?     12/21/23 6. AGGRAVATING FACTORS: Does anything make it  worse? (e.g., standing, change in head position)     Changing head position 7. HEART RATE: Can you tell me your heart rate? How many beats in 15 seconds?  (Note: Not all patients can do this.)       Not able to do so 8. CAUSE: What do you think is causing the dizziness? (e.g., decreased fluids or food, diarrhea, emotional distress, heat exposure, new medicine, sudden standing, vomiting; unknown)     Severe diarrhea 20 episodes in the last 24 hours looks like water no blood.Feels exhausted all the time:. Almost passes out when she went outside this morning, Frequent, congested cough no blood or sputum. Mild SOB at rest I feel like I am on fire due to low B 12 according to ED Provider.  She denies any other symptoms. 9. RECURRENT SYMPTOM: Have you had dizziness before? If Yes, ask: When was the last time? What happened that time?     No 10. OTHER SYMPTOMS: Do you have any other symptoms? (e.g., fever, chest pain, vomiting, diarrhea, bleeding)       She denies other symptoms, she is speaking clearly with pauses for cough. 11. PREGNANCY: Is there any chance you are pregnant? When was your last menstrual period?       Not asked   Encounter Protocols Used: Dizziness - Lightheadedness-A-AH  Any interventions?  Yes   What has been done? (include related medication given, dose, and date/time) No meds taken couldn't get put of bed   [facility] Eligibility  Patient is  eligible for [facility] (according to most recent zip code and insurance information)?  No  Patient is [facility] eligible and appropriate for referral: No   Encounter Initial Assessment: 1. DESCRIPTION: Describe your dizziness.     Room is spinning 2. LIGHTHEADED: Do you feel lightheaded? (e.g., somewhat faint, woozy, weak upon standing)     Somewhat faint, a little weak upon standing 3. VERTIGO: Do you feel like either you or the room is spinning or tilting? (i.e., vertigo)      Yes, room spinning at times 4. SEVERITY: How bad is it?  Do you feel like you are going to faint? Can you stand and walk?     Sitting feels like she would faint-unable to walk unaided 5. ONSET:  When did the dizziness begin?     12/21/23 6. AGGRAVATING FACTORS: Does anything make it worse? (e.g., standing, change in head position)     Changing head position 7. HEART RATE: Can you tell me your heart rate? How many beats in 15 seconds?  (Note: Not all patients can do this.)       Not able to do so 8. CAUSE: What do you think is causing the dizziness? (e.g., decreased fluids or food, diarrhea, emotional distress, heat exposure, new medicine, sudden standing, vomiting; unknown)     Severe diarrhea 20 episodes in the last 24 hours looks like water no blood.Feels exhausted all the time:. Almost passes out when she went outside this morning, Frequent, congested cough no blood or sputum. Mild SOB at rest I feel like I am on fire due to low B 12 according to ED Provider.  She denies any other symptoms. 9. RECURRENT SYMPTOM: Have you had dizziness before? If Yes, ask: When was the last time? What happened that time?     No 10. OTHER SYMPTOMS: Do you have any other symptoms? (e.g., fever, chest pain, vomiting, diarrhea, bleeding)       She denies other symptoms, she is speaking clearly with pauses for cough. 11. PREGNANCY: Is there any chance you are pregnant? When was your last menstrual period?       Not asked   Encounter Protocols Used: Dizziness - Lightheadedness-A-AHUpcoming Appt: Future Appointments  Date Time Provider Department Center  12/23/2023  2:40 PM Jacques Arlon Goodell, MD [facility]    Disposition: Go to ED Now  Triage documentation reviewed and Disposition present? yes  Encounter Reason for Disposition:   Difficulty breathing   Is this a pediatric patient?  No   Any recent, relevant visit?  No  Any relevant  medical history?  No  Any interventions?  No   [facility] Eligibility  Patient is eligible for [facility] (according to most recent zip code and insurance information)?  No  Patient is [facility] eligible and appropriate for referral: No   Encounter Initial Assessment: 1. DESCRIPTION: Describe your dizziness.     Room is spinning 2. LIGHTHEADED: Do you feel lightheaded? (e.g., somewhat faint, woozy, weak upon standing)     Somewhat faint, a little weak upon standing 3. VERTIGO: Do you feel like either you or the room is spinning or tilting? (i.e., vertigo)     Yes, room spinning at times 4. SEVERITY: How bad is it?  Do you feel like you are going to faint? Can you stand and walk?     Sitting feels like she would faint-unable to walk unaided 5. ONSET:  When did the dizziness begin?  12/21/23 6. AGGRAVATING FACTORS: Does anything make it worse? (e.g., standing, change in head position)     Changing head position 7. HEART RATE: Can you tell me your heart rate? How many beats in 15 seconds?  (Note: Not all patients can do this.)       Not able to do so 8. CAUSE: What do you think is causing the dizziness? (e.g., decreased fluids or food, diarrhea, emotional distress, heat exposure, new medicine, sudden standing, vomiting; unknown)     Severe diarrhea 20 episodes in the last 24 hours looks like water no blood.Feels exhausted all the time:. Almost passes out when she went outside this morning, Frequent, congested cough no blood or sputum. Mild SOB at rest I feel like I am on fire due to low B 12 according to ED Provider.  She denies any other symptoms. 9. RECURRENT SYMPTOM: Have you had dizziness before? If Yes, ask: When was the last time? What happened that time?     No 10. OTHER SYMPTOMS: Do you have any other symptoms? (e.g., fever, chest pain, vomiting, diarrhea, bleeding)       She denies other symptoms, she is speaking clearly  with pauses for cough. 11. PREGNANCY: Is there any chance you are pregnant? When was your last menstrual period?       Not asked   Encounter Protocols Used: Dizziness - Lightheadedness-A-AH

## 2023-12-23 NOTE — ED Notes (Signed)
 Unable to obtain labs in triage x 1.

## 2023-12-23 NOTE — ED Provider Notes (Signed)
 [place] EMERGENCY DEPARTMENT AT [facility] Provider Note   CSN: [id number] Arrival date & time: 12/23/23  1515     Patient presents with: Seizures   Alexandra Sanchez is a 58 y.o. female.    Seizures  58 year old female presenting with a chief complaint of seizures.  Patient was seen on 12/21/2023 for seizures and a COPD exacerbation.  At that time they wanted to admit her however she left AMA.  Since being here she has had 2 more seizures today.  Her boyfriend states that she passes out and then has a tonic clonic seizure with a postictal state.  He states that these last about a minute.  She has not been taking her Keppra  as prescribed.  When asked about other medication she says that she has not been taking those either.  Patient states that the last time she smoked weed was last night but denies any other illicit drugs.  She has not had any seizures while in the ER.  She has not had any other symptoms.  Her last seizure was around noon today and has not had another one since then.  Patient is on oxygen  at baseline.  She denies any chest pain.     Prior to Admission medications  Medication Sig Start Date End Date Taking? Authorizing Provider  albuterol  (VENTOLIN  HFA) 108 (90 Base) MCG/ACT inhaler Inhale 2 puffs into the lungs every 4 (four) hours as needed for wheezing or shortness of breath. 12/21/23   Pearlean Manus, MD  amLODipine  (NORVASC ) 5 MG tablet Take 1 tablet (5 mg total) by mouth daily. 12/21/23   Pearlean Manus, MD  atorvastatin  (LIPITOR) 40 MG tablet Take 1 tablet (40 mg total) by mouth daily. 12/21/23   Pearlean Manus, MD  budesonide -formoterol  (SYMBICORT) 160-4.5 MCG/ACT inhaler 1-2 puffs.    [provider]  cyanocobalamin  (VITAMIN B12) 1000 MCG tablet Take 1 tablet (1,000 mcg total) by mouth daily. You have vitamin b12 deficiency.  You should follow up with your PCP outpatient about supplementation, you may need injections.  This is probably  causing the burning in your hands and feet. 12/22/23 01/21/24  Perri DELENA Meliton Mickey., MD  doxycycline  (VIBRAMYCIN ) 100 MG capsule Take 1 capsule (100 mg total) by mouth 2 (two) times daily for 5 days. Patient not taking: Reported on 12/22/2023 12/21/23 12/26/23  Pearlean Manus, MD  folic acid  (FOLVITE ) 1 MG tablet Take 1 tablet (1 mg total) by mouth daily. 12/22/23 01/21/24  Perri DELENA Meliton Mickey., MD  guaiFENesin  (MUCINEX ) 600 MG 12 hr tablet Take 1 tablet (600 mg total) by mouth 2 (two) times daily. Patient not taking: Reported on 12/22/2023 12/21/23   Pearlean Manus, MD  ipratropium-albuterol  (DUONEB) 0.5-2.5 (3) MG/3ML SOLN Inhale 3 mLs into the lungs in the morning, at noon, in the evening, and at bedtime. Patient taking differently: Inhale 3 mLs into the lungs every 6 (six) hours as needed (SOB and wheezing). 12/21/23   Pearlean Manus, MD  levETIRAcetam  (KEPPRA ) 500 MG tablet Take 1 tablet (500 mg total) by mouth 2 (two) times daily. Follow with your outpatient provider for refills. 12/22/23 03/21/24  Perri DELENA Meliton Mickey., MD  pantoprazole  (PROTONIX ) 40 MG tablet Take 1 tablet (40 mg total) by mouth daily. Patient not taking: Reported on 12/22/2023 12/22/23   Pearlean Manus, MD  predniSONE  (DELTASONE ) 20 MG tablet Take 2 tablets (40 mg total) by mouth daily with breakfast for 5 days. Patient not taking: Reported on 12/22/2023 12/21/23 12/26/23  Pearlean Manus, MD    Allergies: Aspirin, Bee pollen, Bee venom, Haloperidol, Haloperidol lactate, Latex, and Erythromycin    Review of Systems  Neurological:  Positive for seizures.  All other systems reviewed and are negative.   Updated Vital Signs BP 135/85   Pulse 86   Temp 98 F (36.7 C)   Resp 18   SpO2 97%   Physical Exam Vitals and nursing note reviewed.  HENT:     Mouth/Throat:     Pharynx: Oropharynx is clear.  Cardiovascular:     Rate and Rhythm: Normal rate.     Pulses: Normal pulses.  Pulmonary:     Effort:  Pulmonary effort is normal.     Breath sounds: Normal breath sounds.  Abdominal:     General: Abdomen is flat. Bowel sounds are normal.     Palpations: Abdomen is soft.  Skin:    General: Skin is warm and dry.  Neurological:     General: No focal deficit present.     Mental Status: She is alert.     GCS: GCS eye subscore is 4. GCS verbal subscore is 5. GCS motor subscore is 6.     Cranial Nerves: Cranial nerves 2-12 are intact.     Sensory: Sensation is intact.     Coordination: Coordination is intact.     (all labs ordered are listed, but only abnormal results are displayed) Labs Reviewed  COMPREHENSIVE METABOLIC PANEL WITH GFR - Abnormal; Notable for the following components:      Result Value   Total Protein 6.3 (*)    Alkaline Phosphatase 139 (*)    All other components within normal limits  CBC WITH DIFFERENTIAL/PLATELET - Abnormal; Notable for the following components:   WBC 11.0 (*)    RBC 5.25 (*)    All other components within normal limits  CBG MONITORING, ED - Abnormal; Notable for the following components:   Glucose-Capillary 103 (*)    All other components within normal limits  MAGNESIUM   ETHANOL  RAPID URINE DRUG SCREEN, HOSP PERFORMED  TROPONIN I (HIGH SENSITIVITY)    EKG: EKG Interpretation Date/Time:  Monday December 23 2023 15:36:34 EST Ventricular Rate:  83 PR Interval:  162 QRS Duration:  92 QT Interval:  364 QTC Calculation: 427 R Axis:   68  Text Interpretation: Normal sinus rhythm Nonspecific ST abnormality Abnormal ECG When compared with ECG of 21-Dec-2023 21:24, PREVIOUS ECG IS PRESENT Confirmed by Cleotilde Rogue ([id number]) on 12/23/2023 6:09:45 PM  Radiology: DG Chest 1 View Result Date: 12/23/2023 EXAM: 1 VIEW(S) XRAY OF THE CHEST 12/23/2023 06:17:00 PM COMPARISON: 12/21/2023 CLINICAL HISTORY: Cough FINDINGS: LUNGS AND PLEURA: Mild lingular scarring/atelectasis. No pleural effusion. No pneumothorax. HEART AND MEDIASTINUM: No acute abnormality  of the cardiac and mediastinal silhouettes. BONES AND SOFT TISSUES: No acute osseous abnormality. IMPRESSION: 1. No acute findings. Electronically signed by: Pinkie Pebbles MD 12/23/2023 06:55 PM EST RP Workstation: [id number]   CT Head Wo Contrast Result Date: 12/23/2023 EXAM: CT HEAD WITHOUT 12/23/2023 03:51:00 PM TECHNIQUE: CT of the head was performed without the administration of intravenous contrast. Automated exposure control, iterative reconstruction, and/or weight based adjustment of the mA/kV was utilized to reduce the radiation dose to as low as reasonably achievable. COMPARISON: 12/21/2023 CLINICAL HISTORY: Headache, neuro deficit FINDINGS: BRAIN AND VENTRICLES: No acute intracranial hemorrhage. No mass effect or midline shift. No extra-axial fluid collection. No evidence of acute infarct. No hydrocephalus. Unchanged background of mild chronic microvascular ischemic change. ORBITS: No acute abnormality.  SINUSES AND MASTOIDS: No acute abnormality. SOFT TISSUES AND SKULL: No acute skull fracture. No acute soft tissue abnormality. IMPRESSION: 1. No acute intracranial abnormality. Electronically signed by: Ryan Chess MD 12/23/2023 04:02 PM EST RP Workstation: [id number]   Overnight EEG with video Result Date: 12/22/2023 Shelton Arlin KIDD, MD     12/23/2023  9:00 AM Patient Name: Alexandra Sanchez MRN: [id number] Epilepsy Attending: Arlin KIDD Shelton Referring Physician/Provider: Voncile Isles, MD Duration: 12/22/2023 0830 to 1539 Patient history: 58 y.o. female  past medical history of  hypertension, hyperlipidemia, COPD with exacerbation and epilepsy, presenting with recurrent seizures.  EEG to evaluate for seizure Level of alertness: Awake, asleep AEDs during EEG study: LEV Technical aspects: This EEG study was done with scalp electrodes positioned according to the 10-20 International system of electrode placement. Electrical activity was reviewed with band pass filter of 1-70Hz , sensitivity of  7 uV/mm, display speed of 49mm/sec with a 60Hz  notched filter applied as appropriate. EEG data were recorded continuously and digitally stored.  Video monitoring was available and reviewed as appropriate. Description: The posterior dominant rhythm consists of 8-9 Hz activity of moderate voltage (25-35 uV) seen predominantly in posterior head regions, symmetric and reactive to eye opening and eye closing. Sleep was characterized by vertex waves, sleep spindles (12 to 14 Hz), maximal frontocentral region. Hyperventilation and photic stimulation were not performed.   IMPRESSION: This study is within normal limits. No seizures or epileptiform discharges were seen throughout the recording. A normal interictal EEG does not exclude the diagnosis of epilepsy. Priyanka O Yadav   CT Head Wo Contrast Result Date: 12/21/2023 EXAM: CT HEAD WITHOUT 12/21/2023 09:47:07 PM TECHNIQUE: CT of the head was performed without the administration of intravenous contrast. Automated exposure control, iterative reconstruction, and/or weight based adjustment of the mA/kV was utilized to reduce the radiation dose to as low as reasonably achievable. COMPARISON: 12/20/2023 CLINICAL HISTORY: Seizure disorder, clinical change. FINDINGS: BRAIN AND VENTRICLES: No acute intracranial hemorrhage. No mass effect or midline shift. No extra-axial fluid collection. No evidence of acute infarct. ORBITS: No acute abnormality. SINUSES AND MASTOIDS: No acute abnormality. SOFT TISSUES AND SKULL: No acute skull fracture. No acute soft tissue abnormality. IMPRESSION: 1. No acute intracranial abnormality. Electronically signed by: Oneil Devonshire MD 12/21/2023 09:50 PM EST RP Workstation: [id number]   DG Chest Portable 1 View Result Date: 12/21/2023 EXAM: 1 VIEW(S) XRAY OF THE CHEST 12/21/2023 09:46:00 PM COMPARISON: 12/20/2023 CLINICAL HISTORY: sz FINDINGS: LUNGS AND PLEURA: Coarsened interstitial markings without pulmonary edema. No focal pulmonary opacity.  No pleural effusion. No pneumothorax. HEART AND MEDIASTINUM: No acute abnormality of the cardiac and mediastinal silhouettes. BONES AND SOFT TISSUES: Cervical spine surgical hardware noted. No acute osseous abnormality. IMPRESSION: 1. Coarsened interstitial lung markings without pulmonary edema. Electronically signed by: Oneil Devonshire MD 12/21/2023 09:48 PM EST RP Workstation: [id number]     Procedures   Medications Ordered in the ED  levETIRAcetam  (KEPPRA ) undiluted injection 1,000 mg (has no administration in time range)  levETIRAcetam  (KEPPRA ) undiluted injection 1,000 mg (1,000 mg Intravenous Given 12/23/23 1912)                                    Medical Decision Making Risk Decision regarding hospitalization.   Impression: 58 year old female with seizures.  Differential diagnoses include seizures, status epilepticus, hemorrhagic stroke  Additional History: I obtained information from patient and patient's significant other.  I also reviewed past  outpatient notes and recent ER visit from 12/21/2023.  Labs: CBC CMP were ordered and showed no abnormalities.  Imaging: Head CT was ordered and showed no signs of intracranial hemorrhage.  ED Course/Meds: Patient was seen at [facility] for a COPD exacerbation and was recommended to be admitted at that time.  She was then seen at [facility] same day for seizures and was also needing to be admitted however she did not want to be admitted at that time.  After having 2 other seizures since then she is now wanting to be admitted for further evaluation.  She was given 2 g of Keppra .      Final diagnoses:  None    ED Discharge Orders     None          Rosaline Almarie KANDICE DEVONNA 12/23/23 2218    Cleotilde Rogue, MD 12/23/23 [phone]

## 2023-12-23 NOTE — ED Provider Triage Note (Signed)
 Emergency Medicine Provider Triage Evaluation Note  Alexandra Sanchez , a 58 y.o. female  was evaluated in triage.  Pt complains of seizure.  Review of Systems  Positive: seizure Negative: ha  Physical Exam  BP (!) 146/98 (BP Location: Left Arm)   Pulse 93   Temp 98 F (36.7 C)   Resp 18   SpO2 100%  Gen:   Awake, no distress, placed on 3 L nasal cannula with improvement in oxygen  saturation. Resp:  Normal effort  MSK:   Moves extremities without difficulty  Other:  Patient has no focal neurological deficit but unable to ambulate with steady gait and complains of dizziness, presyncopal feeling upon standing or moving head side-to-side.  Medical Decision Making  Medically screening exam initiated at 3:33 PM.  Appropriate orders placed.  Odyssey June Shehan was informed that the remainder of the evaluation will be completed by another provider, this initial triage assessment does not replace that evaluation, and the importance of remaining in the ED until their evaluation is complete.     Shermon Warren SAILOR, PA-C 12/23/23 1534

## 2023-12-23 NOTE — Procedures (Signed)
 Patient Name: Taeja June Burgett  MRN: [id number]  Epilepsy Attending: Arlin MALVA Krebs  Referring Physician/Provider: Voncile Isles, MD  Duration: 12/22/2023 0830 to 1539  Patient history: 58 y.o. female  past medical history of  hypertension, hyperlipidemia, COPD with exacerbation and epilepsy, presenting with recurrent seizures.  EEG to evaluate for seizure  Level of alertness: Awake, asleep  AEDs during EEG study: LEV  Technical aspects: This EEG study was done with scalp electrodes positioned according to the 10-20 International system of electrode placement. Electrical activity was reviewed with band pass filter of 1-70Hz , sensitivity of 7 uV/mm, display speed of 97mm/sec with a 60Hz  notched filter applied as appropriate. EEG data were recorded continuously and digitally stored.  Video monitoring was available and reviewed as appropriate.  Description: The posterior dominant rhythm consists of 8-9 Hz activity of moderate voltage (25-35 uV) seen predominantly in posterior head regions, symmetric and reactive to eye opening and eye closing. Sleep was characterized by vertex waves, sleep spindles (12 to 14 Hz), maximal frontocentral region. Hyperventilation and photic stimulation were not performed.     IMPRESSION: This study is within normal limits. No seizures or epileptiform discharges were seen throughout the recording.  A normal interictal EEG does not exclude the diagnosis of epilepsy.   Ziyan Schoon O Hildur Bayer
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
## 2023-12-24 ENCOUNTER — Observation Stay (HOSPITAL_COMMUNITY): Payer: MEDICAID

## 2023-12-24 DIAGNOSIS — J441 Chronic obstructive pulmonary disease with (acute) exacerbation: Secondary | ICD-10-CM | POA: Diagnosis present

## 2023-12-24 DIAGNOSIS — E785 Hyperlipidemia, unspecified: Secondary | ICD-10-CM | POA: Diagnosis present

## 2023-12-24 DIAGNOSIS — I10 Essential (primary) hypertension: Secondary | ICD-10-CM | POA: Diagnosis present

## 2023-12-24 DIAGNOSIS — Z91148 Patient's other noncompliance with medication regimen for other reason: Secondary | ICD-10-CM | POA: Diagnosis not present

## 2023-12-24 DIAGNOSIS — Z7951 Long term (current) use of inhaled steroids: Secondary | ICD-10-CM | POA: Diagnosis not present

## 2023-12-24 DIAGNOSIS — Z79899 Other long term (current) drug therapy: Secondary | ICD-10-CM | POA: Diagnosis not present

## 2023-12-24 DIAGNOSIS — Z833 Family history of diabetes mellitus: Secondary | ICD-10-CM | POA: Diagnosis not present

## 2023-12-24 DIAGNOSIS — Z881 Allergy status to other antibiotic agents status: Secondary | ICD-10-CM | POA: Diagnosis not present

## 2023-12-24 DIAGNOSIS — J209 Acute bronchitis, unspecified: Secondary | ICD-10-CM | POA: Diagnosis present

## 2023-12-24 DIAGNOSIS — F17210 Nicotine dependence, cigarettes, uncomplicated: Secondary | ICD-10-CM | POA: Diagnosis present

## 2023-12-24 DIAGNOSIS — Z91040 Latex allergy status: Secondary | ICD-10-CM | POA: Diagnosis not present

## 2023-12-24 DIAGNOSIS — Z90710 Acquired absence of both cervix and uterus: Secondary | ICD-10-CM | POA: Diagnosis not present

## 2023-12-24 DIAGNOSIS — Z9049 Acquired absence of other specified parts of digestive tract: Secondary | ICD-10-CM | POA: Diagnosis not present

## 2023-12-24 DIAGNOSIS — Z1152 Encounter for screening for COVID-19: Secondary | ICD-10-CM | POA: Diagnosis not present

## 2023-12-24 DIAGNOSIS — R569 Unspecified convulsions: Secondary | ICD-10-CM | POA: Diagnosis present

## 2023-12-24 DIAGNOSIS — Z886 Allergy status to analgesic agent status: Secondary | ICD-10-CM | POA: Diagnosis not present

## 2023-12-24 DIAGNOSIS — T426X6A Underdosing of other antiepileptic and sedative-hypnotic drugs, initial encounter: Secondary | ICD-10-CM | POA: Diagnosis present

## 2023-12-24 DIAGNOSIS — Z888 Allergy status to other drugs, medicaments and biological substances status: Secondary | ICD-10-CM | POA: Diagnosis not present

## 2023-12-24 DIAGNOSIS — Z91030 Bee allergy status: Secondary | ICD-10-CM | POA: Diagnosis not present

## 2023-12-24 DIAGNOSIS — G40909 Epilepsy, unspecified, not intractable, without status epilepticus: Secondary | ICD-10-CM | POA: Diagnosis present

## 2023-12-24 LAB — RESPIRATORY PANEL BY PCR

## 2023-12-24 LAB — BASIC METABOLIC PANEL WITH GFR
Anion gap: 8 (ref 5–15)
BUN: 10 mg/dL (ref 6–20)
CO2: 26 mmol/L (ref 22–32)
Calcium: 8.5 mg/dL — ABNORMAL LOW (ref 8.9–10.3)
Chloride: 106 mmol/L (ref 98–111)
Creatinine, Ser: 0.67 mg/dL (ref 0.44–1.00)
GFR, Estimated: >60 mL/min (ref >60)
Glucose, Bld: 88 mg/dL (ref 70–99)
Potassium: 3.7 mmol/L (ref 3.5–5.1)
Sodium: 140 mmol/L (ref 135–145)

## 2023-12-24 LAB — CBC
HCT: 41.1 % (ref 36.0–46.0)
Hemoglobin: 12.9 g/dL (ref 12.0–15.0)
MCH: 27.1 pg (ref 26.0–34.0)
MCHC: 31.4 g/dL (ref 30.0–36.0)
MCV: 86.3 fL (ref 80.0–100.0)
Platelets: 267 K/uL (ref 150–400)
RBC: 4.76 MIL/uL (ref 3.87–5.11)
RDW: 14 % (ref 11.5–15.5)
WBC: 8.3 K/uL (ref 4.0–10.5)
nRBC: 0 % (ref 0.0–0.2)

## 2023-12-24 LAB — RAPID URINE DRUG SCREEN, HOSP PERFORMED
Amphetamines: NOT DETECTED — AB
Barbiturates: NOT DETECTED — AB
Benzodiazepines: NOT DETECTED — AB
Cocaine: NOT DETECTED — AB
Opiates: NOT DETECTED — AB
Tetrahydrocannabinol: POSITIVE — AB

## 2023-12-24 LAB — SARS CORONAVIRUS 2 BY RT PCR: SARS Coronavirus 2 by RT PCR: NEGATIVE

## 2023-12-24 MED ORDER — NICOTINE 21 MG/24HR TD PT24
21.0000 mg | MEDICATED_PATCH | Freq: Every day | TRANSDERMAL | Status: DC | PRN
  Administered 2023-12-24 – 2023-12-25 (×2): 21 mg via TRANSDERMAL
  Filled 2023-12-24 (×2): qty 1

## 2023-12-24 MED ORDER — LEVETIRACETAM 500 MG PO TABS: 500.0000 mg | ORAL_TABLET | Freq: Two times a day (BID) | ORAL | Status: DC

## 2023-12-24 MED ORDER — LEVETIRACETAM (KEPPRA) 500 MG/5 ML ADULT IV PUSH
500.0000 mg | Freq: Two times a day (BID) | INTRAVENOUS | Status: DC
  Administered 2023-12-24 – 2023-12-25 (×3): 500 mg via INTRAVENOUS
  Filled 2023-12-24 (×3): qty 5

## 2023-12-24 MED ORDER — FENTANYL CITRATE (PF) 50 MCG/ML IJ SOSY
25.0000 ug | PREFILLED_SYRINGE | INTRAMUSCULAR | Status: DC | PRN
  Administered 2023-12-24 (×2): 25 ug via INTRAVENOUS
  Filled 2023-12-24 (×2): qty 1

## 2023-12-24 MED ORDER — MELATONIN 3 MG PO TABS
3.0000 mg | ORAL_TABLET | Freq: Every evening | ORAL | Status: DC | PRN
  Administered 2023-12-24: 3 mg via ORAL
  Filled 2023-12-24: qty 1

## 2023-12-24 NOTE — Progress Notes (Signed)
 vLTM setup  All impedances below 10k.  [facility] not monitoring   ED patient

## 2023-12-24 NOTE — Progress Notes (Signed)
 LTM EEG moved from ER to 5w study is hooked up and running online - push button tested - [facility] monitoring.

## 2023-12-24 NOTE — Plan of Care (Signed)

## 2023-12-24 NOTE — Progress Notes (Addendum)
 [facility] night cross cover note:   I have ordered prn melatonin for sleep for this patient as well as as needed IV fentanyl  for report of crampy abdominal discomfort in addition to headache.   Update: prn nicotine  patch ordered, per patient's request.     Eva Pore, DO Hospitalist

## 2023-12-24 NOTE — Progress Notes (Signed)
 PROGRESS NOTE    Alexandra Sanchez  FMW:[id number] DOB: 1965-12-04 DOA: 12/23/2023 PCP: Jacques Arlon PARAS, MD   Brief Narrative:  This 58 yrs old female with PMH significant for seizures, hypertension, COPD who was recently admitted to the hospital for further workup for seizures but left AMA yesterday.  She presented back in the ED today after her boyfriend witnessed her having 2 seizures 3 hours apart.  She was watching television during the episode.  She left AMA last night and has not taken Keppra  since then.  Patient states that she has not been taking any of her medications for almost a year.  She has recently been admitted to the hospital over 3 times in the last month and half. She reports getting dizzy spells mostly on standing. In the ED patient was loaded with Keppra , She was seen by neurologist.  Influenza and COVID test were negative.  Patient was admitted for further workup of seizure-like episodes.  CT head unremarkable.   Assessment & Plan:   Principal Problem:   Seizures (HCC) Active Problems:   COPD with acute exacerbation (HCC)   Essential hypertension   Seizure (HCC)  Seizures : Patient presented with withdrawal seizures in the setting of noncompliance. Patient left AMA yesterday, She has not taken Keppra  at home and has recurrence of seizures. Appreciate neurology consultation.  Patient was given Keppra  load in the ED. Plan is to continue on Keppra  500 mg twice daily and LTM EEG.   Further recommendations per neurology.  Acute bronchitis / COPD exacerbation: Continue with scheduled and as needed nebulized bronchodilators. Continue Pulmicort   Neb 2 times daily. Consider prednisone  if she has wheezing.  Essential HTN:  She is not on any antihypertensives.   Follow blood pressure trends.    Dizzy spells : Complains of having dizzy spells.  Has had 2D echo recently on December 21, 2023 which showed EF of 60 to 65%.  Check orthostatics in the morning.  Monitor on  telemetry.  Monitor shows sinus rhythm.    DVT prophylaxis: Lovenox  Code Status: Full code Family Communication: No family at bed side Disposition Plan:    Status is: Observation The patient remains OBS appropriate and will d/c before 2 midnights.   She admitted for withdrawal seizures in the setting of noncompliance.  She left AMA yesterday presented back with seizures.  Neurology recommended long-term EEG.  Patient is not medically ready for discharge.  Consultants:  Neurology  Procedures: CT head.  Antimicrobials:  Anti-infectives (From admission, onward)    None      Subjective: Patient was seen and examined at bedside. Overnight events noted. Patient is connected with the long-term EEG. She reports feeling better, states she has not been compliant with her medications.  Objective: Vitals:   12/24/23 0530 12/24/23 0615 12/24/23 0834 12/24/23 1000  BP: 116/75  106/74   Pulse: 72  68   Resp: 15  11   Temp:  97.9 F (36.6 C) 97.6 F (36.4 C)   TempSrc:  Oral Oral   SpO2: 99%  96%   Height:    5' 3 (1.6 m)   No intake or output data in the 24 hours ending 12/24/23 1103 There were no vitals filed for this visit.  Examination:  General exam: Appears calm and comfortable, not in any acute distress. Respiratory system: CTA Bilaterally. Respiratory effort normal.  RR 16 Cardiovascular system: S1 & S2 heard, RRR. No JVD, murmurs, rubs, gallops or clicks.  Gastrointestinal system: Abdomen is  non distended, soft and non tender.  Normal bowel sounds heard. Central nervous system: Alert and oriented x 3. No focal neurological deficits. Extremities: No edema, no cyanosis, no clubbing. Skin: No rashes, lesions or ulcers Psychiatry: Judgement and insight appear normal. Mood & affect appropriate.   Data Reviewed: I have personally reviewed following labs and imaging studies  CBC: Recent Labs  Lab 12/21/23 2127 12/21/23 2132 12/22/23 0514 12/23/23 1726  12/23/23 2230 12/24/23 0241  WBC 18.6*  --  11.9* 11.0* 9.9 8.3  NEUTROABS 15.6*  --   --  7.3  --   --   HGB 13.8 15.3* 12.2 14.4 13.7 12.9  HCT 43.4 45.0 38.0 45.0 43.8 41.1  MCV 87.0  --  85.6 85.7 85.7 86.3  PLT 351  --  285 304 292 267   Basic Metabolic Panel: Recent Labs  Lab 12/20/23 1657 12/21/23 2127 12/21/23 2132 12/22/23 0514 12/23/23 1726 12/23/23 2230 12/24/23 0241  NA 141 138 139 138 139  --  140  K 3.9 4.3 4.3 4.8 3.6  --  3.7  CL 104 103 104 102 101  --  106  CO2 25 26  --  25 27  --  26  GLUCOSE 87 138* 135* 110* 92  --  88  BUN 9 15 21* 12 10  --  10  CREATININE 0.68 0.88 0.80 0.72  0.69 0.70 0.65 0.67  CALCIUM  9.0 9.2  --  8.4* 8.9  --  8.5*  MG  --  2.0  --   --  1.8  --   --    GFR: Estimated Creatinine Clearance: 62.6 mL/min (by C-G formula based on SCr of 0.67 mg/dL). Liver Function Tests: Recent Labs  Lab 12/21/23 2127 12/23/23 1726  AST 29 23  ALT 11 15  ALKPHOS 158* 139*  BILITOT 1.1 0.5  PROT 6.9 6.3*  ALBUMIN 3.8 3.6   No results for input(s): LIPASE, AMYLASE in the last 168 hours. No results for input(s): AMMONIA in the last 168 hours. Coagulation Profile: No results for input(s): INR, PROTIME in the last 168 hours. Cardiac Enzymes: No results for input(s): CKTOTAL, CKMB, CKMBINDEX, TROPONINI in the last 168 hours. BNP (last 3 results) Recent Labs    12/06/23 1839 12/20/23 1807  PROBNP 55.9 <50.0   HbA1C: Recent Labs    12/22/23 0856  HGBA1C 5.2   CBG: Recent Labs  Lab 12/23/23 1602  GLUCAP 103*   Lipid Profile: No results for input(s): CHOL, HDL, LDLCALC, TRIG, CHOLHDL, LDLDIRECT in the last 72 hours. Thyroid Function Tests: Recent Labs    12/22/23 0921  TSH 0.421   Anemia Panel: Recent Labs    12/22/23 0921  VITAMINB12 <150*  FOLATE 6.3   Sepsis Labs: No results for input(s): PROCALCITON, LATICACIDVEN in the last 168 hours.  Recent Results (from the past 240 hours)   Resp panel by RT-PCR (RSV, Flu A&B, Covid) Anterior Nasal Swab     Status: None   Collection Time: 12/20/23  7:05 PM   Specimen: Anterior Nasal Swab  Result Value Ref Range Status   SARS Coronavirus 2 by RT PCR NEGATIVE NEGATIVE Final    Comment: (NOTE) SARS-CoV-2 target nucleic acids are NOT DETECTED.  The SARS-CoV-2 RNA is generally detectable in upper respiratory specimens during the acute phase of infection. The lowest concentration of SARS-CoV-2 viral copies this assay can detect is 138 copies/mL. A negative result does not preclude SARS-Cov-2 infection and should not be used as the sole  basis for treatment or other patient management decisions. A negative result may occur with  improper specimen collection/handling, submission of specimen other than nasopharyngeal swab, presence of viral mutation(s) within the areas targeted by this assay, and inadequate number of viral copies(<138 copies/mL). A negative result must be combined with clinical observations, patient history, and epidemiological information. The expected result is Negative.  Fact Sheet for Patients:  bloggercourse.com  Fact Sheet for Healthcare Providers:  seriousbroker.it  This test is no t yet approved or cleared by the [place]  FDA and  has been authorized for detection and/or diagnosis of SARS-CoV-2 by FDA under an Emergency Use Authorization (EUA). This EUA will remain  in effect (meaning this test can be used) for the duration of the COVID-19 declaration under Section 564(b)(1) of the Act, 21 U.S.C.section 360bbb-3(b)(1), unless the authorization is terminated  or revoked sooner.       Influenza A by PCR NEGATIVE NEGATIVE Final   Influenza B by PCR NEGATIVE NEGATIVE Final    Comment: (NOTE) The Xpert Xpress SARS-CoV-2/FLU/RSV plus assay is intended as an aid in the diagnosis of influenza from Nasopharyngeal swab specimens and should not be used  as a sole basis for treatment. Nasal washings and aspirates are unacceptable for Xpert Xpress SARS-CoV-2/FLU/RSV testing.  Fact Sheet for Patients: bloggercourse.com  Fact Sheet for Healthcare Providers: seriousbroker.it  This test is not yet approved or cleared by the [place]  FDA and has been authorized for detection and/or diagnosis of SARS-CoV-2 by FDA under an Emergency Use Authorization (EUA). This EUA will remain in effect (meaning this test can be used) for the duration of the COVID-19 declaration under Section 564(b)(1) of the Act, 21 U.S.C. section 360bbb-3(b)(1), unless the authorization is terminated or revoked.     Resp Syncytial Virus by PCR NEGATIVE NEGATIVE Final    Comment: (NOTE) Fact Sheet for Patients: bloggercourse.com  Fact Sheet for Healthcare Providers: seriousbroker.it  This test is not yet approved or cleared by the [place]  FDA and has been authorized for detection and/or diagnosis of SARS-CoV-2 by FDA under an Emergency Use Authorization (EUA). This EUA will remain in effect (meaning this test can be used) for the duration of the COVID-19 declaration under Section 564(b)(1) of the Act, 21 U.S.C. section 360bbb-3(b)(1), unless the authorization is terminated or revoked.  Performed at [facility], [address], [place], [place] [address]   Resp panel by RT-PCR (RSV, Flu A&B, Covid) Urine, Clean Catch     Status: None   Collection Time: 12/21/23  9:29 PM   Specimen: Urine, Clean Catch; Nasal Swab  Result Value Ref Range Status   SARS Coronavirus 2 by RT PCR NEGATIVE NEGATIVE Final   Influenza A by PCR NEGATIVE NEGATIVE Final   Influenza B by PCR NEGATIVE NEGATIVE Final    Comment: (NOTE) The Xpert Xpress SARS-CoV-2/FLU/RSV plus assay is intended as an aid in the diagnosis of influenza from Nasopharyngeal swab specimens and should not be  used as a sole basis for treatment. Nasal washings and aspirates are unacceptable for Xpert Xpress SARS-CoV-2/FLU/RSV testing.  Fact Sheet for Patients: bloggercourse.com  Fact Sheet for Healthcare Providers: seriousbroker.it  This test is not yet approved or cleared by the [place]  FDA and has been authorized for detection and/or diagnosis of SARS-CoV-2 by FDA under an Emergency Use Authorization (EUA). This EUA will remain in effect (meaning this test can be used) for the duration of the COVID-19 declaration under Section 564(b)(1) of the Act, 21 U.S.C. section  360bbb-3(b)(1), unless the authorization is terminated or revoked.     Resp Syncytial Virus by PCR NEGATIVE NEGATIVE Final    Comment: (NOTE) Fact Sheet for Patients: bloggercourse.com  Fact Sheet for Healthcare Providers: seriousbroker.it  This test is not yet approved or cleared by the [place]  FDA and has been authorized for detection and/or diagnosis of SARS-CoV-2 by FDA under an Emergency Use Authorization (EUA). This EUA will remain in effect (meaning this test can be used) for the duration of the COVID-19 declaration under Section 564(b)(1) of the Act, 21 U.S.C. section 360bbb-3(b)(1), unless the authorization is terminated or revoked.  Performed at [facility], [address] [place], [place] [address]   SARS Coronavirus 2 by RT PCR (hospital order, performed in [facility] hospital lab) *cepheid single result test* Anterior Nasal Swab     Status: None   Collection Time: 12/23/23 10:30 PM   Specimen: Anterior Nasal Swab  Result Value Ref Range Status   SARS Coronavirus 2 by RT PCR NEGATIVE NEGATIVE Final    Comment: Performed at [facility], [address] [place], [place] [address]  Respiratory (~20 pathogens) panel by PCR     Status: None   Collection Time: 12/23/23 10:30 PM    Specimen: Nasopharyngeal Swab; Respiratory  Result Value Ref Range Status   Adenovirus NOT DETECTED NOT DETECTED Final   Coronavirus 229E NOT DETECTED NOT DETECTED Final    Comment: (NOTE) The Coronavirus on the Respiratory Panel, DOES NOT test for the novel  Coronavirus (2019 nCoV)    Coronavirus HKU1 NOT DETECTED NOT DETECTED Final   Coronavirus NL63 NOT DETECTED NOT DETECTED Final   Coronavirus OC43 NOT DETECTED NOT DETECTED Final   Metapneumovirus NOT DETECTED NOT DETECTED Final   Rhinovirus / Enterovirus NOT DETECTED NOT DETECTED Final   Influenza A NOT DETECTED NOT DETECTED Final   Influenza B NOT DETECTED NOT DETECTED Final   Parainfluenza Virus 1 NOT DETECTED NOT DETECTED Final   Parainfluenza Virus 2 NOT DETECTED NOT DETECTED Final   Parainfluenza Virus 3 NOT DETECTED NOT DETECTED Final   Parainfluenza Virus 4 NOT DETECTED NOT DETECTED Final   Respiratory Syncytial Virus NOT DETECTED NOT DETECTED Final   Bordetella pertussis NOT DETECTED NOT DETECTED Final   Bordetella Parapertussis NOT DETECTED NOT DETECTED Final   Chlamydophila pneumoniae NOT DETECTED NOT DETECTED Final   Mycoplasma pneumoniae NOT DETECTED NOT DETECTED Final    Comment: Performed at [facility], [address], [place], [place] [address]   Radiology Studies: DG Chest 1 View Result Date: 12/23/2023 EXAM: 1 VIEW(S) XRAY OF THE CHEST 12/23/2023 06:17:00 PM COMPARISON: 12/21/2023 CLINICAL HISTORY: Cough FINDINGS: LUNGS AND PLEURA: Mild lingular scarring/atelectasis. No pleural effusion. No pneumothorax. HEART AND MEDIASTINUM: No acute abnormality of the cardiac and mediastinal silhouettes. BONES AND SOFT TISSUES: No acute osseous abnormality. IMPRESSION: 1. No acute findings. Electronically signed by: Pinkie Pebbles MD 12/23/2023 06:55 PM EST RP Workstation: [id number]   CT Head Wo Contrast Result Date: 12/23/2023 EXAM: CT HEAD WITHOUT 12/23/2023 03:51:00 PM TECHNIQUE: CT of the head was performed  without the administration of intravenous contrast. Automated exposure control, iterative reconstruction, and/or weight based adjustment of the mA/kV was utilized to reduce the radiation dose to as low as reasonably achievable. COMPARISON: 12/21/2023 CLINICAL HISTORY: Headache, neuro deficit FINDINGS: BRAIN AND VENTRICLES: No acute intracranial hemorrhage. No mass effect or midline shift. No extra-axial fluid collection. No evidence of acute infarct. No hydrocephalus. Unchanged background of mild  chronic microvascular ischemic change. ORBITS: No acute abnormality. SINUSES AND MASTOIDS: No acute abnormality. SOFT TISSUES AND SKULL: No acute skull fracture. No acute soft tissue abnormality. IMPRESSION: 1. No acute intracranial abnormality. Electronically signed by: Ryan Chess MD 12/23/2023 04:02 PM EST RP Workstation: [id number]   Overnight EEG with video Result Date: 12/22/2023 Shelton Arlin KIDD, MD     12/23/2023  9:00 AM Patient Name: Phung June Birkhead MRN: [id number] Epilepsy Attending: Arlin KIDD Shelton Referring Physician/Provider: Voncile Isles, MD Duration: 12/22/2023 0830 to 1539 Patient history: 58 y.o. female  past medical history of  hypertension, hyperlipidemia, COPD with exacerbation and epilepsy, presenting with recurrent seizures.  EEG to evaluate for seizure Level of alertness: Awake, asleep AEDs during EEG study: LEV Technical aspects: This EEG study was done with scalp electrodes positioned according to the 10-20 International system of electrode placement. Electrical activity was reviewed with band pass filter of 1-70Hz , sensitivity of 7 uV/mm, display speed of 68mm/sec with a 60Hz  notched filter applied as appropriate. EEG data were recorded continuously and digitally stored.  Video monitoring was available and reviewed as appropriate. Description: The posterior dominant rhythm consists of 8-9 Hz activity of moderate voltage (25-35 uV) seen predominantly in posterior head regions, symmetric  and reactive to eye opening and eye closing. Sleep was characterized by vertex waves, sleep spindles (12 to 14 Hz), maximal frontocentral region. Hyperventilation and photic stimulation were not performed.   IMPRESSION: This study is within normal limits. No seizures or epileptiform discharges were seen throughout the recording. A normal interictal EEG does not exclude the diagnosis of epilepsy. Priyanka O Yadav   Scheduled Meds:  budesonide  (PULMICORT ) nebulizer solution  0.25 mg Nebulization BID   enoxaparin  (LOVENOX ) injection  40 mg Subcutaneous Q24H   ipratropium-albuterol   3 mL Nebulization Q4H   levETIRAcetam   500 mg Intravenous Q12H   Continuous Infusions:   LOS: 0 days    Time spent: 50 mins    Darcel Dawley, MD [facility]   If 7PM-7AM, please contact night-coverage
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
## 2023-12-24 NOTE — ED Notes (Signed)
 Pt ambulatory to restroom with no assistance from staff.

## 2023-12-24 NOTE — Progress Notes (Signed)
 LTM moved from er to 5w.  EEG hooked up and running online - push button tested - [facility] monitoring.

## 2023-12-25 ENCOUNTER — Inpatient Hospital Stay (HOSPITAL_COMMUNITY): Payer: MEDICAID

## 2023-12-25 ENCOUNTER — Other Ambulatory Visit (HOSPITAL_COMMUNITY): Payer: Self-pay

## 2023-12-25 DIAGNOSIS — R569 Unspecified convulsions: Secondary | ICD-10-CM

## 2023-12-25 DIAGNOSIS — G40909 Epilepsy, unspecified, not intractable, without status epilepticus: Secondary | ICD-10-CM | POA: Diagnosis not present

## 2023-12-25 LAB — GLUCOSE, CAPILLARY: Glucose-Capillary: 91 mg/dL (ref 70–99)

## 2023-12-25 LAB — LEVETIRACETAM LEVEL: Levetiracetam Lvl: <2 ug/mL — ABNORMAL LOW (ref 10.0–40.0)

## 2023-12-25 MED ORDER — CYANOCOBALAMIN 1000 MCG/ML IJ SOLN
1000.0000 ug | Freq: Every day | INTRAMUSCULAR | Status: DC
  Administered 2023-12-25: 10:00:00 1000 ug via SUBCUTANEOUS
  Filled 2023-12-25: qty 1

## 2023-12-25 MED ORDER — NICOTINE 21 MG/24HR TD PT24
21.0000 mg | MEDICATED_PATCH | Freq: Every day | TRANSDERMAL | 0 refills | Status: DC | PRN
  Filled 2023-12-25: qty 28, 28d supply, fill #0

## 2023-12-25 MED ORDER — VITAMIN B-12 1000 MCG PO TABS: 1000.0000 ug | ORAL_TABLET | Freq: Every day | ORAL | Status: DC

## 2023-12-25 MED ORDER — ACETAMINOPHEN 325 MG PO TABS
650.0000 mg | ORAL_TABLET | Freq: Four times a day (QID) | ORAL | Status: DC | PRN
  Administered 2023-12-25: 10:00:00 650 mg via ORAL
  Filled 2023-12-25: qty 2

## 2023-12-25 MED ORDER — LEVETIRACETAM 500 MG PO TABS
500.0000 mg | ORAL_TABLET | Freq: Two times a day (BID) | ORAL | 2 refills | Status: DC
  Filled 2023-12-25: qty 60, 30d supply, fill #0

## 2023-12-25 MED FILL — Cyanocobalamin Tab 1000 MCG: 1000.0000 ug | ORAL | 30 days supply | Qty: 30 | Fill #0 | Status: AC

## 2023-12-25 MED FILL — Folic Acid Tab 1 MG: 1.0000 mg | ORAL | 30 days supply | Qty: 30 | Fill #0 | Status: CN

## 2023-12-25 NOTE — Plan of Care (Signed)

## 2023-12-25 NOTE — Progress Notes (Signed)
 PROGRESS NOTE    Alexandra Sanchez  FMW:[id number] DOB: July 31, 1965 DOA: 12/23/2023 PCP: Jacques Arlon PARAS, MD   Brief Narrative:  This 58 yrs old female with PMH significant for seizures, hypertension, COPD who was recently admitted to the hospital for further workup for seizures but left AMA yesterday.  She presented back in the ED today after her boyfriend witnessed her having 2 seizures 3 hours apart.  She was watching television during the episode.  She left AMA last night and has not taken Keppra  since then.  Patient states that she has not been taking any of her medications for almost a year.  She has recently been admitted to the hospital over 3 times in the last month and half. She reports getting dizzy spells mostly on standing. In the ED patient was loaded with Keppra , She was seen by neurologist.  Influenza and COVID test were negative.  Patient was admitted for further workup of seizure-like episodes.  CT head unremarkable.   Assessment & Plan:   Breakthrough seizure  Patient presented with breakthrough seizures due to noncompliance with his seizure medications, she had left AMA few days ago for similar issue, currently on Keppra  undergoing long-term EEG, neurology on board.  Head CT nonacute.  EEG pending, currently seizure-free continue to monitor.  Counseled on compliance.    Acute bronchitis / COPD exacerbation: Continue with scheduled and as needed nebulized bronchodilators. Continue Pulmicort   Neb 2 times daily. No wheezing, stable.  Essential HTN:  She is not on any antihypertensives.   Follow blood pressure trends.    Dizzy spells : Complains of having dizzy spells.  Has had 2D echo recently on December 21, 2023 which showed EF of 60 to 65%.  Check orthostatics in the morning.  Monitor on telemetry.  Monitor shows sinus rhythm.  Commence PT and monitor orthostatics.    DVT prophylaxis: Lovenox  Code Status: Full code Family Communication: No family at bed  side Disposition Plan:    Status is: Observation The patient remains OBS appropriate and will d/c before 2 midnights.   She admitted for withdrawal seizures in the setting of noncompliance.  She left AMA yesterday presented back with seizures.  Neurology recommended long-term EEG.  Patient is not medically ready for discharge.  Consultants:  Neurology  Procedures:   CT head.  Nonacute.  Long-term EEG.  Antimicrobials:  Anti-infectives (From admission, onward)    None      Subjective:  Patient in bed, appears comfortable, mild chronic generalized headache, no fever, no chest pain or pressure, no shortness of breath , no abdominal pain. No new focal weakness.   Objective: Vitals:   12/24/23 2005 12/25/23 0018 12/25/23 0400 12/25/23 0745  BP: 117/66 (!) 100/49 121/72   Pulse:  82 63   Resp: 18 17 18    Temp: 98.1 F (36.7 C) 98.1 F (36.7 C) 98.1 F (36.7 C)   TempSrc: Oral Oral Oral   SpO2:  99% 97% 95%  Height:       Awake Alert, No new F.N deficits, Normal affect [place].AT,PERRAL Supple Neck, No JVD,   Symmetrical Chest wall movement, Good air movement bilaterally, CTAB RRR,No Gallops, Rubs or new Murmurs,  +ve B.Sounds, Abd Soft, No tenderness,   No Cyanosis, Clubbing or edema     Data Review:   Patient Lines/Drains/Airways Status     Active Line/Drains/Airways     Name Placement date Placement time Site Days   Peripheral IV 12/24/23 22 G 1 Left;Posterior Forearm 12/24/23  1508  Forearm  1             Inpatient Medications  Scheduled Meds:  budesonide  (PULMICORT ) nebulizer solution  0.25 mg Nebulization BID   enoxaparin  (LOVENOX ) injection  40 mg Subcutaneous Q24H   levETIRAcetam   500 mg Intravenous Q12H   Continuous Infusions: PRN Meds:.fentaNYL  (SUBLIMAZE ) injection, ipratropium-albuterol , melatonin, nicotine   DVT Prophylaxis  enoxaparin  (LOVENOX ) injection 40 mg Start: 12/24/23 1000   Recent Labs  Lab 12/21/23 2127 12/21/23 2132  12/22/23 0514 12/23/23 1726 12/23/23 2230 12/24/23 0241  WBC 18.6*  --  11.9* 11.0* 9.9 8.3  HGB 13.8 15.3* 12.2 14.4 13.7 12.9  HCT 43.4 45.0 38.0 45.0 43.8 41.1  PLT 351  --  285 304 292 267  MCV 87.0  --  85.6 85.7 85.7 86.3  MCH 27.7  --  27.5 27.4 26.8 27.1  MCHC 31.8  --  32.1 32.0 31.3 31.4  RDW 14.3  --  14.3 14.2 14.0 14.0  LYMPHSABS 2.0  --   --  2.8  --   --   MONOABS 0.8  --   --  0.7  --   --   EOSABS 0.0  --   --  0.1  --   --   BASOSABS 0.0  --   --  0.0  --   --     Recent Labs  Lab 12/20/23 1657 12/21/23 2127 12/21/23 2132 12/22/23 0514 12/22/23 0856 12/22/23 0921 12/23/23 1726 12/23/23 2230 12/24/23 0241  NA 141 138 139 138  --   --  139  --  140  K 3.9 4.3 4.3 4.8  --   --  3.6  --  3.7  CL 104 103 104 102  --   --  101  --  106  CO2 25 26  --  25  --   --  27  --  26  ANIONGAP 13 9  --  11  --   --  11  --  8  GLUCOSE 87 138* 135* 110*  --   --  92  --  88  BUN 9 15 21* 12  --   --  10  --  10  CREATININE 0.68 0.88 0.80 0.72  0.69  --   --  0.70 0.65 0.67  AST  --  29  --   --   --   --  23  --   --   ALT  --  11  --   --   --   --  15  --   --   ALKPHOS  --  158*  --   --   --   --  139*  --   --   BILITOT  --  1.1  --   --   --   --  0.5  --   --   ALBUMIN  --  3.8  --   --   --   --  3.6  --   --   TSH  --   --   --   --   --  0.421  --   --   --   HGBA1C  --   --   --   --  5.2  --   --   --   --   MG  --  2.0  --   --   --   --  1.8  --   --  CALCIUM  9.0 9.2  --  8.4*  --   --  8.9  --  8.5*      Recent Labs  Lab 12/20/23 1657 12/21/23 2127 12/22/23 0514 12/22/23 0856 12/22/23 0921 12/23/23 1726 12/24/23 0241  TSH  --   --   --   --  0.421  --   --   HGBA1C  --   --   --  5.2  --   --   --   MG  --  2.0  --   --   --  1.8  --   CALCIUM  9.0 9.2 8.4*  --   --  8.9 8.5*    --------------------------------------------------------------------------------------------------------------- Lab Results  Component Value Date   CHOL  162 02/07/2022   HDL 36 (L) 02/07/2022   LDLCALC 86 02/07/2022   TRIG 198 (H) 02/07/2022   CHOLHDL 4.5 02/07/2022    Lab Results  Component Value Date   HGBA1C 5.2 12/22/2023   Recent Labs    12/22/23 0921  TSH 0.421   Recent Labs    12/22/23 0921  VITAMINB12 <150*  FOLATE 6.3   Radiology Reports  DG Chest 1 View Result Date: 12/23/2023 EXAM: 1 VIEW(S) XRAY OF THE CHEST 12/23/2023 06:17:00 PM COMPARISON: 12/21/2023 CLINICAL HISTORY: Cough FINDINGS: LUNGS AND PLEURA: Mild lingular scarring/atelectasis. No pleural effusion. No pneumothorax. HEART AND MEDIASTINUM: No acute abnormality of the cardiac and mediastinal silhouettes. BONES AND SOFT TISSUES: No acute osseous abnormality. IMPRESSION: 1. No acute findings. Electronically signed by: Pinkie Pebbles MD 12/23/2023 06:55 PM EST RP Workstation: [id number]   CT Head Wo Contrast Result Date: 12/23/2023 EXAM: CT HEAD WITHOUT 12/23/2023 03:51:00 PM TECHNIQUE: CT of the head was performed without the administration of intravenous contrast. Automated exposure control, iterative reconstruction, and/or weight based adjustment of the mA/kV was utilized to reduce the radiation dose to as low as reasonably achievable. COMPARISON: 12/21/2023 CLINICAL HISTORY: Headache, neuro deficit FINDINGS: BRAIN AND VENTRICLES: No acute intracranial hemorrhage. No mass effect or midline shift. No extra-axial fluid collection. No evidence of acute infarct. No hydrocephalus. Unchanged background of mild chronic microvascular ischemic change. ORBITS: No acute abnormality. SINUSES AND MASTOIDS: No acute abnormality. SOFT TISSUES AND SKULL: No acute skull fracture. No acute soft tissue abnormality. IMPRESSION: 1. No acute intracranial abnormality. Electronically signed by: Ryan Chess MD 12/23/2023 04:02 PM EST RP Workstation: [id number]      Signature  -   Lavada Stank M.D on 12/25/2023 at 8:23 AM   -  To page go to [email or web address]

## 2023-12-25 NOTE — Progress Notes (Signed)
 NEUROLOGY CONSULT FOLLOW UP NOTE   Date of service: December 25, 2023 Patient Name: Alexandra Sanchez MRN:  [id number] DOB:  [birth year]  Interval Hx/subjective  No acute overnight events, patient remains on LTM EEG. Patient reports that she has had seizures since age 58 but has not taken Keppra  in about a year as she had been seizure-free for a while and did not think that it was necessary any longer.  She denies any recent illness or GI upset.  Patient does endorse that she is ready for discharge and notes that she does not want to eat without being able to smoke tobacco.  Vitals   Vitals:   12/24/23 2005 12/25/23 0018 12/25/23 0400 12/25/23 0745  BP: 117/66 (!) 100/49 121/72 137/81  Pulse:  82 63 79  Resp: 18 17 18 14   Temp: 98.1 F (36.7 C) 98.1 F (36.7 C) 98.1 F (36.7 C) 97.9 F (36.6 C)  TempSrc: Oral Oral Oral Oral  SpO2:  99% 97% 95%  Height:        Body mass index is 22.67 kg/m.  Physical Exam   Constitutional: Appears well-developed and well-nourished.  Psych: Affect appropriate to situation.  Patient is calm and cooperative with exam. Eyes: No scleral injection.  HENT: No OP obstrucion.  Head: Normocephalic.  Cardiovascular: Normal rate and regular rhythm.  Respiratory: Effort normal, non-labored breathing on supplemental oxygen  via [place] GI: Soft.  No distension. There is no tenderness.  Skin: WDI.   Neurologic Examination  Mental Status: Patient is awake, alert, oriented to person, place, month, year, and situation Patient is able to give a clear and coherent history. No signs of aphasia or neglect Cranial Nerves: II: Visual Fields are full. Pupils are equal, round, and reactive to light.   III,IV, VI: EOMI without ptosis or diploplia.  V: Facial sensation is intact and symmetric to light touch VII: Face is symmetric resting and with movement VIII: Hearing is intact to voice X: Palate elevates symmetrically XI: Shoulder shrug is symmetric. XII: Tongue  protrudes midline  Motor: Tone is normal. Bulk is normal. 5/5 strength was present in all four extremities.  Sensory: Sensation is symmetric to light touch and temperature in the arms and legs. No extinction to DSS present.  Cerebellar: FNF intact without ataxia Gait: Not assessed due to cEEG monitoring in place   Medications Current Medications[1]  Labs and Diagnostic Imaging   CBC:  Recent Labs  Lab 12/21/23 2127 12/21/23 2132 12/23/23 1726 12/23/23 2230 12/24/23 0241  WBC 18.6*   < > 11.0* 9.9 8.3  NEUTROABS 15.6*  --  7.3  --   --   HGB 13.8   < > 14.4 13.7 12.9  HCT 43.4   < > 45.0 43.8 41.1  MCV 87.0   < > 85.7 85.7 86.3  PLT 351   < > 304 292 267   < > = values in this interval not displayed.   Basic Metabolic Panel:  Lab Results  Component Value Date   NA 140 12/24/2023   K 3.7 12/24/2023   CO2 26 12/24/2023   GLUCOSE 88 12/24/2023   BUN 10 12/24/2023   CREATININE 0.67 12/24/2023   CALCIUM  8.5 (L) 12/24/2023   GFRNONAA >60 12/24/2023   GFRAA >60 08/26/2019   Lipid Panel:  Lab Results  Component Value Date   LDLCALC 86 02/07/2022   HgbA1c:  Lab Results  Component Value Date   HGBA1C 5.2 12/22/2023   Urine Drug Screen:  Component Value Date/Time   LABOPIA NONE DETECTED (A) 12/24/2023 0506   COCAINSCRNUR NONE DETECTED (A) 12/24/2023 0506   LABBENZ NONE DETECTED (A) 12/24/2023 0506   AMPHETMU NONE DETECTED (A) 12/24/2023 0506   THCU POSITIVE (A) 12/24/2023 0506   LABBARB NONE DETECTED (A) 12/24/2023 0506    Alcohol Level     Component Value Date/Time   [facility] <15 12/23/2023 1726   INR  Lab Results  Component Value Date   INR 1.0 03/19/2020   APTT  Lab Results  Component Value Date   APTT 31 03/19/2020   AED levels:  Lab Results  Component Value Date   LEVETIRACETA 2.8 (L) 12/21/2023   CT Head without contrast (Personally reviewed): No acute intracranial abnormality  cEEG:  This study is within normal limits. No seizures or  epileptiform discharges were seen throughout the recording. A normal interictal EEG does not exclude the diagnosis of epilepsy.  Assessment   Alexandra Sanchez is a 58 y.o. female with PMHx of HTN, HLD, COPD with exacerbation, epilepsy presenting with recurrent seizures.  She was seen previously this week and put on LTM EEG but left AMA but had 2 witnessed outpatient seizure events following an aura of passing out and was readmitted for LTM.  Patient states that she has had seizures since 58 years of age but has not taken Keppra  in approximately 1 year due to feeling that it was unnecessary as she had not experienced seizures in some time.  She denies any negative side effects of Keppra .  Recommendations  - Patient voices readiness for discharge - DC LTM EEG - Continue Keppra  500 mg BID, patient agrees to taking medication at discharge - No further inpatient neurology work up indicated at this time.  EEG is negative, will dc and sign off. Provide seizure precautions as follow at discharge: - Plan discussed with Dr. Michaela who is in agreement   Seizure precautions: Per [place]  DMV statutes, patients with seizures are not allowed to drive until they have been seizure-free for six months and cleared by a physician    Use caution when using heavy equipment or power tools. Avoid working on ladders or at heights. Take showers instead of baths. Ensure the water temperature is not too high on the home water heater. Do not go swimming alone. Do not lock yourself in a room alone (i.e. bathroom). When caring for infants or small children, sit down when holding, feeding, or changing them to minimize risk of injury to the child in the event you have a seizure. Maintain good sleep hygiene. Avoid alcohol.    If patient has another seizure, call 911 and bring them back to the ED if: A.  The seizure lasts longer than 5 minutes.      B.  The patient doesn't wake shortly after the seizure or has new  problems such as difficulty seeing, speaking or moving following the seizure C.  The patient was injured during the seizure D.  The patient has a temperature over 102 F (39C) E.  The patient vomited during the seizure and now is having trouble breathing    During the Seizure   - First, ensure adequate ventilation and place patients on the floor on their left side  Loosen clothing around the neck and ensure the airway is patent. If the patient is clenching the teeth, do not force the mouth open with any object as this can cause severe damage - Remove all items from the surrounding that  can be hazardous. The patient may be oblivious to what's happening and may not even know what he or she is doing. If the patient is confused and wandering, either gently guide him/her away and block access to outside areas - Reassure the individual and be comforting - Call 911. In most cases, the seizure ends before EMS arrives. However, there are cases when seizures may last over 3 to 5 minutes. Or the individual may have developed breathing difficulties or severe injuries. If a pregnant patient or a person with diabetes develops a seizure, it is prudent to call an ambulance. - Finally, if the patient does not regain full consciousness, then call EMS. Most patients will remain confused for about 45 to 90 minutes after a seizure, so you must use judgment in calling for help. - Avoid restraints but make sure the patient is in a bed with padded side rails - Place the individual in a lateral position with the neck slightly flexed; this will help the saliva drain from the mouth and prevent the tongue from falling backward - Remove all nearby furniture and other hazards from the area - Provide verbal assurance as the individual is regaining consciousness - Provide the patient with privacy if possible - Call for help and start treatment as ordered by the caregiver    After the Seizure (Postictal Stage)   After a  seizure, most patients experience confusion, fatigue, muscle pain and/or a headache. Thus, one should permit the individual to sleep. For the next few days, reassurance is essential. Being calm and helping reorient the person is also of importance.   Most seizures are painless and end spontaneously. Seizures are not harmful to others but can lead to complications such as stress on the lungs, brain and the heart. Individuals with prior lung problems may develop labored breathing and respiratory distress.  ______________________________________________________________________  Signed,  Jesse Nosbisch W Peytan Andringa, NP [facility]     [1]  Current Facility-Administered Medications:    acetaminophen  (TYLENOL ) tablet 650 mg, 650 mg, Oral, Q6H PRN, Singh, Prashant K, MD   budesonide  (PULMICORT ) nebulizer solution 0.25 mg, 0.25 mg, Nebulization, BID, Kakrakandy, Arshad N, MD, 0.25 mg at 12/25/23 0745   cyanocobalamin  (VITAMIN B12) injection 1,000 mcg, 1,000 mcg, Subcutaneous, Daily, Dennise, Prashant K, MD   [START ON 12/28/2023] cyanocobalamin  (VITAMIN B12) tablet 1,000 mcg, 1,000 mcg, Oral, Daily, Dennise, Prashant K, MD   enoxaparin  (LOVENOX ) injection 40 mg, 40 mg, Subcutaneous, Q24H, Franky Redia SAILOR, MD, 40 mg at 12/24/23 1014   fentaNYL  (SUBLIMAZE ) injection 25 mcg, 25 mcg, Intravenous, Q2H PRN, Howerter, Justin B, DO, 25 mcg at 12/24/23 1221   ipratropium-albuterol  (DUONEB) 0.5-2.5 (3) MG/3ML nebulizer solution 3 mL, 3 mL, Nebulization, Q2H PRN, Franky Redia SAILOR, MD   levETIRAcetam  (KEPPRA ) undiluted injection 500 mg, 500 mg, Intravenous, Q12H, Khatri, Pardeep, MD, 500 mg at 12/24/23 2305   melatonin tablet 3 mg, 3 mg, Oral, QHS PRN, Howerter, Justin B, DO, 3 mg at 12/24/23 0023   nicotine  (NICODERM CQ  - dosed in mg/24 hours) patch 21 mg, 21 mg, Transdermal, Daily PRN, Howerter, Justin B, DO, 21 mg at 12/24/23 [phone]

## 2023-12-25 NOTE — TOC Transition Note (Signed)
 Transition of Care [facility]) - Discharge Note   Patient Details  Name: Alexandra Sanchez MRN: [id number] Date of Birth: January 15, 1965  [facility] of [facility] CM/SW Contact:  Landry DELENA Senters, RN Phone Number: 12/25/2023, 12:40 PM   Clinical Narrative:    RR:[id number] history significant of hypertension, hyperlipidemia, COPD, epilepsy, and history of previous hospital AMA who presents to the emergency department due to several days onset of shortness of breath, syncopal episodes and seizures.  She complained of 4 episodes of syncope since onset of symptoms with last episode being last night and was followed with a seizure.  She complained of nonproductive cough and headache.    Patient lives in [place], lives with her S.O. and two adult children. Reports she has support at home, has transportation assistance if needed, S.O. will be ride home at d/c.    Patient has PCP, manages own medications, no DME at home. Patient left hospital AMA 2 days ago, reports she did pick up her medications but did not start taking them. Does not have a reason for this. She has not taken her Keppra  for the past year due to worry that her PCP would make her see a specialist again before continuing to prescribe Keppra . CM educated on the importance of taking her medications to control her seizures and keep her out of the hospital. Patient verbalizes understanding.    No apparent needs identified by CM.      Barriers to Discharge: No Barriers Identified   Patient Goals and CMS Choice            Discharge Placement                       Discharge Plan and Services Additional resources added to the After Visit Summary for                                       Social Drivers of Health (SDOH) Interventions SDOH Screenings   Food Insecurity: No Food Insecurity (12/24/2023)  Housing: Low Risk (12/24/2023)  Transportation Needs: No Transportation Needs (12/24/2023)  Utilities: Not At Risk (12/24/2023)   Depression (PHQ2-9): High Risk (03/14/2022)  Financial Resource Strain: Low Risk (11/21/2023)   Received from [facility]  Physical Activity: Sufficiently Active (11/21/2023)   Received from [facility]  Social Connections: Socially Isolated (11/21/2023)   Received [facility]  Stress: No Stress Concern Present (11/21/2023)   Received from [facility]  Tobacco Use: High Risk (12/23/2023)  Health Literacy: Low Risk (11/21/2023)   Received from [facility]     Readmission Risk Interventions     No data to display

## 2023-12-25 NOTE — Discharge Summary (Signed)
 Discharge summary note.  Alexandra Sanchez FMW:[id number] DOB: [birth year] DOA: 12/23/2023  PCP: Jacques Arlon PARAS, MD  Admit date: 12/23/2023  Discharge date: 12/25/2023  Admitted From: Home   Disposition:  Home   Recommendations for Outpatient Follow-up:   Follow up with PCP in 1-2 weeks  PCP Please obtain BMP/CBC, 2 view CXR in 1week,  (see Discharge instructions)   PCP Please follow up on the following pending results:    Home Health: None   Equipment/Devices: None  Consultations: Neuro Discharge Condition: Stable    CODE STATUS: Full    Diet Recommendation: Heart Healthy     Chief Complaint  Patient presents with   Seizures     Brief history of present illness from the day of admission and additional interim summary    58 yrs old female with PMH significant for seizures, hypertension, COPD who was recently admitted to the hospital for further workup for seizures but left AMA yesterday. She presented back in the ED today after her boyfriend witnessed her having 2 seizures 3 hours apart. She was watching television during the episode. She left AMA last night and has not taken Keppra  since then. Patient states that she has not been taking any of her medications for almost a year. She has recently been admitted to the hospital over 3 times in the last month and half. She reports getting dizzy spells mostly on standing. In the ED patient was loaded with Keppra , She was seen by neurologist. Influenza and COVID test were negative. Patient was admitted for further workup of seizure-like episodes. CT head unremarkable.                                                                  Hospital Course   Breakthrough seizure  Patient presented with breakthrough seizures due to noncompliance with his seizure  medications, she had left AMA few days ago for similar issue, currently on Keppra  undergoing long-term EEG, neurology on board.  Head CT nonacute.  LTM EEG stable, currently seizure-free , wants to be DC'd ASAP, DW Neuro, Keppra , Neuro follow up.  Counseled on compliance.     Acute bronchitis / COPD exacerbation: Continue with scheduled and as needed nebulized bronchodilators. Continue Pulmicort   Neb 2 times daily. No wheezing, stable.   Essential HTN:  She is not on any antihypertensives.   Follow blood pressure trends.     Dizzy spells : Complains of having dizzy spells.  Has had 2D echo recently on December 21, 2023 which showed EF of 60 to 65%.  Stable on Tele, wants to be DC'd right away and now wants to follow with PCP for this issue.  Discharge diagnosis     Principal Problem:   Seizures (HCC) Active Problems:   COPD with acute  exacerbation (HCC)   Essential hypertension   Seizure [facility])    Discharge instructions    Discharge Instructions     Discharge instructions   Complete by: As directed    Do not drive, operate heavy machinery, perform activities at heights, swimming or participation in water activities or provide baby sitting services until you have seen by Primary MD or a Neurologist and advised to do so again.  Follow with Primary MD Jacques Arlon PARAS, MD in 7 days   Get CBC, CMP, Magnesium , B12, Folic acid , 2 view Chest X ray -  checked next visit with your primary MD    Activity: As tolerated with Full fall precautions use walker/cane & assistance as needed  Disposition Home    Diet: Heart Healthy    Special Instructions: If you have smoked or chewed Tobacco  in the last 2 yrs please stop smoking, stop any regular Alcohol  and or any Recreational drug use.  On your next visit with your primary care physician please Get Medicines reviewed and adjusted.  Please request your Prim.MD to go over all Hospital Tests and Procedure/Radiological results at the  follow up, please get all Hospital records sent to your Prim MD by signing hospital release before you go home.  If you experience worsening of your admission symptoms, develop shortness of breath, life threatening emergency, suicidal or homicidal thoughts you must seek medical attention immediately by calling 911 or calling your MD immediately  if symptoms less severe.  You Must read complete instructions/literature along with all the possible adverse reactions/side effects for all the Medicines you take and that have been prescribed to you. Take any new Medicines after you have completely understood and accpet all the possible adverse reactions/side effects.   Do not drive when taking Pain medications.  Do not take more than prescribed Pain, Sleep and Anxiety Medications  Wear Seat belts while driving.   Increase activity slowly   Complete by: As directed        Discharge Medications   Allergies as of 12/25/2023       Reactions   Aspirin Anaphylaxis, Shortness Of Breath, Swelling, Other (See Comments)   Swelling of tongue and throat   Bee Pollen Anaphylaxis, Other (See Comments)   STOPS BREATHING   Bee Venom Anaphylaxis   Haloperidol Anaphylaxis   Haloperidol Lactate Shortness Of Breath   Latex Shortness Of Breath, Swelling   Erythromycin Hives, Other (See Comments)   Stomach cramps        Medication List     STOP taking these medications    predniSONE  20 MG tablet Commonly known as: DELTASONE        TAKE these medications    albuterol  108 (90 Base) MCG/ACT inhaler Commonly known as: VENTOLIN  HFA Inhale 2 puffs into the lungs every 4 (four) hours as needed for wheezing or shortness of breath.   amLODipine  5 MG tablet Commonly known as: NORVASC  Take 1 tablet (5 mg total) by mouth daily.   atorvastatin  40 MG tablet Commonly known as: LIPITOR Take 1 tablet (40 mg total) by mouth daily.   cyanocobalamin  1000 MCG tablet Commonly known as: VITAMIN B12 Take 1  tablet (1,000 mcg total) by mouth daily. You have vitamin b12 deficiency.  You should follow up with your PCP outpatient about supplementation, you may need injections.  This is probably causing the burning in your hands and feet.   doxycycline  100 MG capsule Commonly known as: VIBRAMYCIN  Take 1 capsule (100 mg total)  by mouth 2 (two) times daily for 5 days.   folic acid  1 MG tablet Commonly known as: FOLVITE  Take 1 tablet (1 mg total) by mouth daily.   guaiFENesin  600 MG 12 hr tablet Commonly known as: Mucinex  Take 1 tablet (600 mg total) by mouth 2 (two) times daily.   ipratropium-albuterol  0.5-2.5 (3) MG/3ML Soln Commonly known as: DUONEB Inhale 3 mLs into the lungs in the morning, at noon, in the evening, and at bedtime. What changed:  when to take this reasons to take this   levETIRAcetam  500 MG tablet Commonly known as: KEPPRA  Take 1 tablet (500 mg total) by mouth 2 (two) times daily. Follow with your outpatient provider for refills.   nicotine  21 mg/24hr patch Commonly known as: NICODERM CQ  - dosed in mg/24 hours Place 1 patch (21 mg total) onto the skin daily as needed (smoking cessation).   pantoprazole  40 MG tablet Commonly known as: PROTONIX  Take 1 tablet (40 mg total) by mouth daily.   Symbicort 160-4.5 MCG/ACT inhaler Generic drug: budesonide -formoterol  Inhale 1-2 puffs into the lungs as needed.         Follow-up Information     Jacques Arlon PARAS, MD. Schedule an appointment as soon as possible for a visit in 1 week(s).   Specialty: Family Medicine Contact information: [address] [place] [address] [phone]         [facility]. Schedule an appointment as soon as possible for a visit in 1 week(s).   Contact information: [address]     [address] [place]  [address] [phone]                Major procedures and Radiology Reports - PLEASE review detailed and final reports thoroughly  -       Overnight EEG with video Result Date: 12/25/2023 Shelton Arlin KIDD, MD     12/25/2023 10:21 AM Patient Name: Chanti June Aufiero MRN: [id number] Epilepsy Attending: Arlin KIDD Shelton Referring Physician/Provider: Khaliqdina, Salman, MD Duration: 12/24/2023 9145 to 12/25/2023 0854 Patient history: : 58 y.o. female  past medical history of  hypertension, hyperlipidemia, COPD with exacerbation and epilepsy, presenting with recurrent seizures.  EEG to evaluate for seizure Level of alertness: Awake, asleep AEDs during EEG study: LEV Technical aspects: This EEG study was done with scalp electrodes positioned according to the 10-20 International system of electrode placement. Electrical activity was reviewed with band pass filter of 1-70Hz , sensitivity of 7 uV/mm, display speed of 71mm/sec with a 60Hz  notched filter applied as appropriate. EEG data were recorded continuously and digitally stored.  Video monitoring was available and reviewed as appropriate. Description: The posterior dominant rhythm consists of 8-9 Hz activity of moderate voltage (25-35 uV) seen predominantly in posterior head regions, symmetric and reactive to eye opening and eye closing. Sleep was characterized by vertex waves, sleep spindles (12 to 14 Hz), maximal frontocentral region. Hyperventilation and photic stimulation were not performed.    IMPRESSION: This study is within normal limits. No seizures or epileptiform discharges were seen throughout the recording.  A normal interictal EEG does not exclude the diagnosis of epilepsy.   Arlin KIDD Shelton   DG Chest 1 View Result Date: 12/23/2023 EXAM: 1 VIEW(S) XRAY OF THE CHEST 12/23/2023 06:17:00 PM COMPARISON: 12/21/2023 CLINICAL HISTORY: Cough FINDINGS: LUNGS AND PLEURA: Mild lingular scarring/atelectasis. No pleural effusion. No pneumothorax. HEART AND MEDIASTINUM: No acute abnormality of the cardiac and mediastinal silhouettes. BONES AND SOFT TISSUES: No acute osseous abnormality.  IMPRESSION: 1. No acute findings. Electronically signed by: Pinkie Pebbles MD 12/23/2023 06:55 PM EST RP Workstation: [id number]   CT Head Wo Contrast Result Date: 12/23/2023 EXAM: CT HEAD WITHOUT 12/23/2023 03:51:00 PM TECHNIQUE: CT of the head was performed without the administration of intravenous contrast. Automated exposure control, iterative reconstruction, and/or weight based adjustment of the mA/kV was utilized to reduce the radiation dose to as low as reasonably achievable. COMPARISON: 12/21/2023 CLINICAL HISTORY: Headache, neuro deficit FINDINGS: BRAIN AND VENTRICLES: No acute intracranial hemorrhage. No mass effect or midline shift. No extra-axial fluid collection. No evidence of acute infarct. No hydrocephalus. Unchanged background of mild chronic microvascular ischemic change. ORBITS: No acute abnormality. SINUSES AND MASTOIDS: No acute abnormality. SOFT TISSUES AND SKULL: No acute skull fracture. No acute soft tissue abnormality. IMPRESSION: 1. No acute intracranial abnormality. Electronically signed by: Ryan Chess MD 12/23/2023 04:02 PM EST RP Workstation: [id number]   Overnight EEG with video Result Date: 12/22/2023 Shelton Arlin KIDD, MD     12/23/2023  9:00 AM Patient Name: Trishelle June Selmer MRN: [id number] Epilepsy Attending: Arlin KIDD Shelton Referring Physician/Provider: Voncile Isles, MD Duration: 12/22/2023 0830 to 1539 Patient history: 58 y.o. female  past medical history of  hypertension, hyperlipidemia, COPD with exacerbation and epilepsy, presenting with recurrent seizures.  EEG to evaluate for seizure Level of alertness: Awake, asleep AEDs during EEG study: LEV Technical aspects: This EEG study was done with scalp electrodes positioned according to the 10-20 International system of electrode placement. Electrical activity was reviewed with band pass filter of 1-70Hz , sensitivity of 7 uV/mm, display speed of 34mm/sec with a 60Hz  notched filter applied as appropriate. EEG data  were recorded continuously and digitally stored.  Video monitoring was available and reviewed as appropriate. Description: The posterior dominant rhythm consists of 8-9 Hz activity of moderate voltage (25-35 uV) seen predominantly in posterior head regions, symmetric and reactive to eye opening and eye closing. Sleep was characterized by vertex waves, sleep spindles (12 to 14 Hz), maximal frontocentral region. Hyperventilation and photic stimulation were not performed.   IMPRESSION: This study is within normal limits. No seizures or epileptiform discharges were seen throughout the recording. A normal interictal EEG does not exclude the diagnosis of epilepsy. Priyanka O Yadav   CT Head Wo Contrast Result Date: 12/21/2023 EXAM: CT HEAD WITHOUT 12/21/2023 09:47:07 PM TECHNIQUE: CT of the head was performed without the administration of intravenous contrast. Automated exposure control, iterative reconstruction, and/or weight based adjustment of the mA/kV was utilized to reduce the radiation dose to as low as reasonably achievable. COMPARISON: 12/20/2023 CLINICAL HISTORY: Seizure disorder, clinical change. FINDINGS: BRAIN AND VENTRICLES: No acute intracranial hemorrhage. No mass effect or midline shift. No extra-axial fluid collection. No evidence of acute infarct. ORBITS: No acute abnormality. SINUSES AND MASTOIDS: No acute abnormality. SOFT TISSUES AND SKULL: No acute skull fracture. No acute soft tissue abnormality. IMPRESSION: 1. No acute intracranial abnormality. Electronically signed by: Oneil Devonshire MD 12/21/2023 09:50 PM EST RP Workstation: [id number]   DG Chest Portable 1 View Result Date: 12/21/2023 EXAM: 1 VIEW(S) XRAY OF THE CHEST 12/21/2023 09:46:00 PM COMPARISON: 12/20/2023 CLINICAL HISTORY: sz FINDINGS: LUNGS AND PLEURA: Coarsened interstitial markings without pulmonary edema. No focal pulmonary opacity. No pleural effusion. No pneumothorax. HEART AND MEDIASTINUM: No acute abnormality of the cardiac  and mediastinal silhouettes. BONES AND SOFT TISSUES: Cervical spine surgical hardware noted. No acute osseous abnormality. IMPRESSION: 1. Coarsened interstitial lung markings without pulmonary edema. Electronically signed by: Oneil Devonshire MD 12/21/2023 09:48 PM EST  RP Workstation: [id number]   ECHOCARDIOGRAM COMPLETE Result Date: 12/21/2023    ECHOCARDIOGRAM REPORT   Patient Name:   ANTANISHA MOHS Wirsing Date of Exam: 12/21/2023 Medical Rec #:  [id number]        Height:       63.0 in Accession #:    [id number]       Weight:       128.0 lb Date of Birth:  05/26/1965         BSA:          1.600 m Patient Age:    58 years         BP:           124/61 mmHg Patient Gender: F                HR:           75 bpm. Exam Location:  [facility] Procedure: 2D Echo, Cardiac Doppler and Color Doppler (Both Spectral and Color            Flow Doppler were utilized during procedure). Indications:    R55 Syncope  History:        Patient has prior history of Echocardiogram examinations, most                 recent 10/17/2021. COPD, Signs/Symptoms:Shortness of Breath and                 Dyspnea; Risk Factors:Hypertension and Dyslipidemia.  Sonographer:    Ellouise Mose RDCS Referring Phys: [id number] [facility] ADEFESO  Sonographer Comments: Suboptimal parasternal window. IMPRESSIONS  1. Left ventricular ejection fraction, by estimation, is 60 to 65%. Left ventricular ejection fraction by 2D MOD biplane is 66.1 %. Left ventricular ejection fraction by PLAX is 59 %. The left ventricle has normal function. The left ventricle has no regional wall motion abnormalities. Left ventricular diastolic parameters were normal.  2. Right ventricular systolic function is normal. The right ventricular size is normal. The estimated right ventricular systolic pressure is 9.9 mmHg.  3. The mitral valve is normal in structure. No evidence of mitral valve regurgitation. No evidence of mitral stenosis.  4. The inferior vena cava is normal in size with greater than 50%  respiratory variability, suggesting right atrial pressure of 3 mmHg.  5. The aortic valve is calcified. Possiblefusion of the left and non-coronary cusps. Aortic valve regurgitation is not visualized. Aortic valve sclerosis/calcification is present, without any evidence of aortic stenosis. Aortic valve area, by VTI measures 2.64 cm. Aortic valve mean gradient measures 9.0 mmHg. Aortic valve Vmax measures 2.03 m/s. Peak gradient 16.5 mmHg, DI 0.64. Based on DI, there does not appear to be significant stenosis. Comparison(s): Changes from prior study are noted. 10/17/2021: LVEF 60-65%, mild AS - mean gradient 8.5 mmHg - however DI normal at 0.56. FINDINGS  Left Ventricle: Left ventricular ejection fraction, by estimation, is 60 to 65%. Left ventricular ejection fraction by PLAX is 59 %. Left ventricular ejection fraction by 2D MOD biplane is 66.1 %. The left ventricle has normal function. The left ventricle has no regional wall motion abnormalities. The left ventricular internal cavity size was normal in size. There is no left ventricular hypertrophy. Left ventricular diastolic parameters were normal. Right Ventricle: The right ventricular size is normal. No increase in right ventricular wall thickness. Right ventricular systolic function is normal. The tricuspid regurgitant velocity is 1.31 m/s, and with an assumed right atrial pressure of 3 mmHg, the estimated right ventricular  systolic pressure is 9.9 mmHg. Left Atrium: Left atrial size was normal in size. Right Atrium: Right atrial size was normal in size. Pericardium: There is no evidence of pericardial effusion. Presence of epicardial fat layer. Mitral Valve: The mitral valve is normal in structure. No evidence of mitral valve regurgitation. No evidence of mitral valve stenosis. Tricuspid Valve: The tricuspid valve is normal in structure. Tricuspid valve regurgitation is not demonstrated. No evidence of tricuspid stenosis. Aortic Valve: Calcification of the  valve- possible fusion of the left and non-coronary cusps. The aortic valve is calcified. There is mild calcification of the aortic valve. Aortic valve regurgitation is not visualized. Aortic valve sclerosis/calcification is present, without any evidence of aortic stenosis. Aortic valve mean gradient measures 9.0 mmHg. Aortic valve peak gradient measures 16.5 mmHg. Aortic valve area, by VTI measures 2.64 cm. Pulmonic Valve: The pulmonic valve was normal in structure. Pulmonic valve regurgitation is not visualized. No evidence of pulmonic stenosis. Aorta: The aortic root and ascending aorta are structurally normal, with no evidence of dilitation. Venous: The right upper pulmonary vein is normal. The inferior vena cava is normal in size with greater than 50% respiratory variability, suggesting right atrial pressure of 3 mmHg. IAS/Shunts: The interatrial septum appears to be lipomatous. No atrial level shunt detected by color flow Doppler.  LEFT VENTRICLE PLAX 2D                        Biplane EF (MOD) LV EF:         Left            LV Biplane EF:   Left                ventricular                      ventricular                ejection                         ejection                fraction by                      fraction by                PLAX is 59                       2D MOD                %.                               biplane is LVIDd:         4.50 cm                          66.1 %. LVIDs:         3.10 cm LV PW:         0.90 cm         Diastology LV IVS:        0.70 cm         LV e' medial:    9.90 cm/s LVOT diam:     2.30 cm  LV E/e' medial:  8.1 LV SV:         106             LV e' lateral:   9.68 cm/s LV SV Index:   66              LV E/e' lateral: 8.3 LVOT Area:     4.15 cm  LV Volumes (MOD) LV vol d, MOD    84.8 ml A2C: LV vol d, MOD    61.8 ml A4C: LV vol s, MOD    28.2 ml A2C: LV vol s, MOD    21.1 ml A4C: LV SV MOD A2C:   56.6 ml LV SV MOD A4C:   61.8 ml LV SV MOD BP:    50.7 ml RIGHT  VENTRICLE            IVC RV S prime:     9.90 cm/s  IVC diam: 1.50 cm TAPSE (M-mode): 1.6 cm                            PULMONARY VEINS                            Diastolic Velocity: 49.30 cm/s                            S/D Velocity:       0.80                            Systolic Velocity:  39.00 cm/s LEFT ATRIUM           Index        RIGHT ATRIUM          Index LA diam:      1.80 cm 1.13 cm/m   RA Area:     8.41 cm LA Vol (A2C): 22.5 ml 14.07 ml/m  RA Volume:   14.80 ml 9.25 ml/m LA Vol (A4C): 17.1 ml 10.69 ml/m  AORTIC VALVE AV Area (Vmax):    2.68 cm AV Area (Vmean):   2.63 cm AV Area (VTI):     2.64 cm AV Vmax:           203.25 cm/s AV Vmean:          135.500 cm/s AV VTI:            0.403 m AV Peak Grad:      16.5 mmHg AV Mean Grad:      9.0 mmHg LVOT Vmax:         131.00 cm/s LVOT Vmean:        85.900 cm/s LVOT VTI:          0.256 m LVOT/AV VTI ratio: 0.64  AORTA Ao Root diam: 2.80 cm MITRAL VALVE               TRICUSPID VALVE MV Area (PHT): 4.36 cm    TR Peak grad:   6.9 mmHg MV Decel Time: 174 msec    TR Vmax:        131.00 cm/s MV E velocity: 80.65 cm/s MV A velocity: 82.95 cm/s  SHUNTS MV E/A ratio:  0.97        Systemic VTI:  0.26 m  Systemic Diam: 2.30 cm Vinie Maxcy MD Electronically signed by Vinie Maxcy MD Signature Date/Time: 12/21/2023/12:35:31 PM    Final    US  Carotid Bilateral Result Date: 12/21/2023 CLINICAL DATA:  Syncope, orthostatic EXAM: BILATERAL CAROTID DUPLEX ULTRASOUND TECHNIQUE: Elnor scale imaging, color Doppler and duplex ultrasound were performed of bilateral carotid and vertebral arteries in the neck. COMPARISON:  None Available. FINDINGS: Criteria: Quantification of carotid stenosis is based on velocity parameters that correlate the residual internal carotid diameter with NASCET-based stenosis levels, using the diameter of the distal internal carotid lumen as the denominator for stenosis measurement. The following velocity measurements  were obtained: RIGHT ICA: 82/33 cm/sec CCA: 56/13 cm/sec SYSTOLIC ICA/CCA RATIO:  1.5 ECA:  69 cm/sec LEFT ICA: 83/33 cm/sec CCA: 70/18 cm/sec SYSTOLIC ICA/CCA RATIO:  1.2 ECA:  99 cm/sec RIGHT CAROTID ARTERY: Heterogeneous atherosclerotic plaque in the common and proximal internal carotid artery. By peak systolic velocity criteria, the estimated stenosis is less than 50%. RIGHT VERTEBRAL ARTERY:  Patent with normal antegrade flow. LEFT CAROTID ARTERY: Heterogeneous atherosclerotic plaque in the proximal internal carotid artery. By peak systolic velocity criteria, the estimated stenosis is less than 50%. LEFT VERTEBRAL ARTERY:  Patent with normal antegrade flow. IMPRESSION: 1. Mild (1-49%) stenosis proximal right internal carotid artery secondary to heterogenous atherosclerotic plaque. 2. Mild (1-49%) stenosis proximal left internal carotid artery secondary to heterogenous atherosclerotic plaque. 3. Vertebral arteries are patent with normal antegrade flow. Electronically Signed   By: Wilkie Lent M.D.   On: 12/21/2023 09:42   CT Head Wo Contrast Result Date: 12/20/2023 EXAM: CT HEAD WITHOUT 12/20/2023 06:11:50 PM TECHNIQUE: CT of the head was performed without the administration of intravenous contrast. Automated exposure control, iterative reconstruction, and/or weight based adjustment of the mA/kV was utilized to reduce the radiation dose to as low as reasonably achievable. COMPARISON: None available. CLINICAL HISTORY: fall FINDINGS: BRAIN AND VENTRICLES: No acute intracranial hemorrhage. No mass effect or midline shift. No extra-axial fluid collection. No evidence of acute infarct. No hydrocephalus. Subcortical and periventricular small vessel ischemic changes. ORBITS: No acute abnormality. SINUSES AND MASTOIDS: No acute abnormality. SOFT TISSUES AND SKULL: No acute skull fracture. No acute soft tissue abnormality. IMPRESSION: 1. No acute intracranial abnormality. Electronically signed by: Pinkie Pebbles MD 12/20/2023 07:10 PM EST RP Workstation: [id number]   DG Chest 2 View Result Date: 12/20/2023 EXAM: 2 VIEW(S) XRAY OF THE CHEST 12/20/2023 05:03:46 PM COMPARISON: 12/06/2023 CLINICAL HISTORY: SOB FINDINGS: LUNGS AND PLEURA: No focal pulmonary opacity. No pleural effusion. No pneumothorax. HEART AND MEDIASTINUM: No acute abnormality of the cardiac and mediastinal silhouettes. BONES AND SOFT TISSUES: Lower cervical spine hardware noted. No acute osseous abnormality. IMPRESSION: 1. No acute cardiopulmonary process. Electronically signed by: Pinkie Pebbles MD 12/20/2023 06:06 PM EST RP Workstation: [id number]   DG Chest 2 View Result Date: 12/06/2023 EXAM: 2 VIEW(S) XRAY OF THE CHEST 12/06/2023 07:16:21 PM COMPARISON: 12/04/2023 CLINICAL HISTORY: SOB FINDINGS: LUNGS AND PLEURA: New linear opacity in right middle lobe. No pleural effusion. No pneumothorax. HEART AND MEDIASTINUM: No acute abnormality of the cardiac and mediastinal silhouettes. BONES AND SOFT TISSUES: Hardware in lower cervical spine partially visualized. No acute osseous abnormality. IMPRESSION: 1. New linear opacity in the right middle lobe, which may reflect atelectasis or early pneumonia. Recommend follow up in 8 - 10 weeks to ensure resolution. Electronically signed by: Norman Gatlin MD 12/06/2023 07:22 PM EST RP Workstation: [id number]   DG Chest Portable 1 View Result Date: 12/04/2023 EXAM: 1 VIEW(S) XRAY OF THE  CHEST 12/04/2023 05:55:00 AM COMPARISON: AP radiograph of the chest dated 12/25/2022. CLINICAL HISTORY: eval for pneumonia FINDINGS: LUNGS AND PLEURA: No focal pulmonary opacity. No pleural effusion. No pneumothorax. HEART AND MEDIASTINUM: No acute abnormality of the cardiac and mediastinal silhouettes. BONES AND SOFT TISSUES: Cervical fixation hardware in place. No acute osseous abnormality. IMPRESSION: 1. No acute process. Electronically signed by: Evalene Coho MD 12/04/2023 06:05 AM EST RP Workstation:  [id number]    Micro Results    Recent Results (from the past 240 hours)  Resp panel by RT-PCR (RSV, Flu A&B, Covid) Anterior Nasal Swab     Status: None   Collection Time: 12/20/23  7:05 PM   Specimen: Anterior Nasal Swab  Result Value Ref Range Status   SARS Coronavirus 2 by RT PCR NEGATIVE NEGATIVE Final    Comment: (NOTE) SARS-CoV-2 target nucleic acids are NOT DETECTED.  The SARS-CoV-2 RNA is generally detectable in upper respiratory specimens during the acute phase of infection. The lowest concentration of SARS-CoV-2 viral copies this assay can detect is 138 copies/mL. A negative result does not preclude SARS-Cov-2 infection and should not be used as the sole basis for treatment or other patient management decisions. A negative result may occur with  improper specimen collection/handling, submission of specimen other than nasopharyngeal swab, presence of viral mutation(s) within the areas targeted by this assay, and inadequate number of viral copies(<138 copies/mL). A negative result must be combined with clinical observations, patient history, and epidemiological information. The expected result is Negative.  Fact Sheet for Patients:  bloggercourse.com  Fact Sheet for Healthcare Providers:  seriousbroker.it  This test is no t yet approved or cleared by the [place]  FDA and  has been authorized for detection and/or diagnosis of SARS-CoV-2 by FDA under an Emergency Use Authorization (EUA). This EUA will remain  in effect (meaning this test can be used) for the duration of the COVID-19 declaration under Section 564(b)(1) of the Act, 21 U.S.C.section 360bbb-3(b)(1), unless the authorization is terminated  or revoked sooner.       Influenza A by PCR NEGATIVE NEGATIVE Final   Influenza B by PCR NEGATIVE NEGATIVE Final    Comment: (NOTE) The Xpert Xpress SARS-CoV-2/FLU/RSV plus assay is intended as an aid in the  diagnosis of influenza from Nasopharyngeal swab specimens and should not be used as a sole basis for treatment. Nasal washings and aspirates are unacceptable for Xpert Xpress SARS-CoV-2/FLU/RSV testing.  Fact Sheet for Patients: bloggercourse.com  Fact Sheet for Healthcare Providers: seriousbroker.it  This test is not yet approved or cleared by the [place]  FDA and has been authorized for detection and/or diagnosis of SARS-CoV-2 by FDA under an Emergency Use Authorization (EUA). This EUA will remain in effect (meaning this test can be used) for the duration of the COVID-19 declaration under Section 564(b)(1) of the Act, 21 U.S.C. section 360bbb-3(b)(1), unless the authorization is terminated or revoked.     Resp Syncytial Virus by PCR NEGATIVE NEGATIVE Final    Comment: (NOTE) Fact Sheet for Patients: bloggercourse.com  Fact Sheet for Healthcare Providers: seriousbroker.it  This test is not yet approved or cleared by the [place]  FDA and has been authorized for detection and/or diagnosis of SARS-CoV-2 by FDA under an Emergency Use Authorization (EUA). This EUA will remain in effect (meaning this test can be used) for the duration of the COVID-19 declaration under Section 564(b)(1) of the Act, 21 U.S.C. section 360bbb-3(b)(1), unless the authorization is terminated or revoked.  Performed at [facility],  [address], [place], [place] [address]   Resp panel by RT-PCR (RSV, Flu A&B, Covid) Urine, Clean Catch     Status: None   Collection Time: 12/21/23  9:29 PM   Specimen: Urine, Clean Catch; Nasal Swab  Result Value Ref Range Status   SARS Coronavirus 2 by RT PCR NEGATIVE NEGATIVE Final   Influenza A by PCR NEGATIVE NEGATIVE Final   Influenza B by PCR NEGATIVE NEGATIVE Final    Comment: (NOTE) The Xpert Xpress SARS-CoV-2/FLU/RSV plus assay is intended as an aid in  the diagnosis of influenza from Nasopharyngeal swab specimens and should not be used as a sole basis for treatment. Nasal washings and aspirates are unacceptable for Xpert Xpress SARS-CoV-2/FLU/RSV testing.  Fact Sheet for Patients: bloggercourse.com  Fact Sheet for Healthcare Providers: seriousbroker.it  This test is not yet approved or cleared by the [place]  FDA and has been authorized for detection and/or diagnosis of SARS-CoV-2 by FDA under an Emergency Use Authorization (EUA). This EUA will remain in effect (meaning this test can be used) for the duration of the COVID-19 declaration under Section 564(b)(1) of the Act, 21 U.S.C. section 360bbb-3(b)(1), unless the authorization is terminated or revoked.     Resp Syncytial Virus by PCR NEGATIVE NEGATIVE Final    Comment: (NOTE) Fact Sheet for Patients: bloggercourse.com  Fact Sheet for Healthcare Providers: seriousbroker.it  This test is not yet approved or cleared by the [place]  FDA and has been authorized for detection and/or diagnosis of SARS-CoV-2 by FDA under an Emergency Use Authorization (EUA). This EUA will remain in effect (meaning this test can be used) for the duration of the COVID-19 declaration under Section 564(b)(1) of the Act, 21 U.S.C. section 360bbb-3(b)(1), unless the authorization is terminated or revoked.  Performed at [facility], [address] [place], [place] [address]   SARS Coronavirus 2 by RT PCR (hospital order, performed in [facility] hospital lab) *cepheid single result test* Anterior Nasal Swab     Status: None   Collection Time: 12/23/23 10:30 PM   Specimen: Anterior Nasal Swab  Result Value Ref Range Status   SARS Coronavirus 2 by RT PCR NEGATIVE NEGATIVE Final    Comment: Performed at [facility], [address], [place], [place] [address]  Respiratory (~20  pathogens) panel by PCR     Status: None   Collection Time: 12/23/23 10:30 PM   Specimen: Nasopharyngeal Swab; Respiratory  Result Value Ref Range Status   Adenovirus NOT DETECTED NOT DETECTED Final   Coronavirus 229E NOT DETECTED NOT DETECTED Final    Comment: (NOTE) The Coronavirus on the Respiratory Panel, DOES NOT test for the novel  Coronavirus (2019 nCoV)    Coronavirus HKU1 NOT DETECTED NOT DETECTED Final   Coronavirus NL63 NOT DETECTED NOT DETECTED Final   Coronavirus OC43 NOT DETECTED NOT DETECTED Final   Metapneumovirus NOT DETECTED NOT DETECTED Final   Rhinovirus / Enterovirus NOT DETECTED NOT DETECTED Final   Influenza A NOT DETECTED NOT DETECTED Final   Influenza B NOT DETECTED NOT DETECTED Final   Parainfluenza Virus 1 NOT DETECTED NOT DETECTED Final   Parainfluenza Virus 2 NOT DETECTED NOT DETECTED Final   Parainfluenza Virus 3 NOT DETECTED NOT DETECTED Final   Parainfluenza Virus 4 NOT DETECTED NOT DETECTED Final   Respiratory Syncytial Virus NOT DETECTED NOT DETECTED Final   Bordetella pertussis NOT DETECTED NOT DETECTED Final   Bordetella Parapertussis NOT DETECTED NOT DETECTED Final   Chlamydophila pneumoniae NOT  DETECTED NOT DETECTED Final   Mycoplasma pneumoniae NOT DETECTED NOT DETECTED Final    Comment: Performed at [facility], [address] [place], [place] [address]    Today   Subjective    Nicola Heinemann today has no headache,no chest abdominal pain,no new weakness tingling or numbness, feels much better wants to go home today.    Objective   Blood pressure (!) 112/59, pulse 74, temperature 98 F (36.7 C), temperature source Oral, resp. rate 15, height 5' 3 (1.6 m), SpO2 95%.   Intake/Output Summary (Last 24 hours) at 12/25/2023 1230 Last data filed at 12/25/2023 0400 Gross per 24 hour  Intake 10 ml  Output --  Net 10 ml    Exam  Awake Alert, No new F.N deficits,    [place].AT,PERRAL Supple Neck,   Symmetrical Chest wall movement,  Good air movement bilaterally, CTAB RRR,No Gallops,   +ve B.Sounds, Abd Soft, Non tender,  No Cyanosis, Clubbing or edema    Data Review   Recent Labs  Lab 12/21/23 2127 12/21/23 2132 12/22/23 0514 12/23/23 1726 12/23/23 2230 12/24/23 0241  WBC 18.6*  --  11.9* 11.0* 9.9 8.3  HGB 13.8 15.3* 12.2 14.4 13.7 12.9  HCT 43.4 45.0 38.0 45.0 43.8 41.1  PLT 351  --  285 304 292 267  MCV 87.0  --  85.6 85.7 85.7 86.3  MCH 27.7  --  27.5 27.4 26.8 27.1  MCHC 31.8  --  32.1 32.0 31.3 31.4  RDW 14.3  --  14.3 14.2 14.0 14.0  LYMPHSABS 2.0  --   --  2.8  --   --   MONOABS 0.8  --   --  0.7  --   --   EOSABS 0.0  --   --  0.1  --   --   BASOSABS 0.0  --   --  0.0  --   --     Recent Labs  Lab 12/20/23 1657 12/21/23 2127 12/21/23 2132 12/22/23 0514 12/22/23 0856 12/22/23 0921 12/23/23 1726 12/23/23 2230 12/24/23 0241  NA 141 138 139 138  --   --  139  --  140  K 3.9 4.3 4.3 4.8  --   --  3.6  --  3.7  CL 104 103 104 102  --   --  101  --  106  CO2 25 26  --  25  --   --  27  --  26  ANIONGAP 13 9  --  11  --   --  11  --  8  GLUCOSE 87 138* 135* 110*  --   --  92  --  88  BUN 9 15 21* 12  --   --  10  --  10  CREATININE 0.68 0.88 0.80 0.72  0.69  --   --  0.70 0.65 0.67  AST  --  29  --   --   --   --  23  --   --   ALT  --  11  --   --   --   --  15  --   --   ALKPHOS  --  158*  --   --   --   --  139*  --   --   BILITOT  --  1.1  --   --   --   --  0.5  --   --   ALBUMIN  --  3.8  --   --   --   --  3.6  --   --   TSH  --   --   --   --   --  0.421  --   --   --   HGBA1C  --   --   --   --  5.2  --   --   --   --   MG  --  2.0  --   --   --   --  1.8  --   --   CALCIUM  9.0 9.2  --  8.4*  --   --  8.9  --  8.5*    Total Time in preparing paper work, data evaluation and todays exam - 35 minutes  Signature  -    Lavada Stank M.D on 12/25/2023 at 12:30 PM   -  To page go to [email or web address]

## 2023-12-25 NOTE — Procedures (Signed)
 Patient Name: Alexandra Sanchez  MRN: [id number]  Epilepsy Attending: Arlin MALVA Krebs  Referring Physician/Provider: Khaliqdina, Salman, MD  Duration: 12/25/2023 0854 to 12/25/2023 1110   Patient history: : 58 y.o. female  past medical history of  hypertension, hyperlipidemia, COPD with exacerbation and epilepsy, presenting with recurrent seizures.  EEG to evaluate for seizure    Level of alertness: Awake, asleep   AEDs during EEG study: LEV   Technical aspects: This EEG study was done with scalp electrodes positioned according to the 10-20 International system of electrode placement. Electrical activity was reviewed with band pass filter of 1-70Hz , sensitivity of 7 uV/mm, display speed of 37mm/sec with a 60Hz  notched filter applied as appropriate. EEG data were recorded continuously and digitally stored.  Video monitoring was available and reviewed as appropriate.   Description: The posterior dominant rhythm consists of 8-9 Hz activity of moderate voltage (25-35 uV) seen predominantly in posterior head regions, symmetric and reactive to eye opening and eye closing. Sleep was characterized by vertex waves, sleep spindles (12 to 14 Hz), maximal frontocentral region. Hyperventilation and photic stimulation were not performed.      IMPRESSION: This study is within normal limits. No seizures or epileptiform discharges were seen throughout the recording.   A normal interictal EEG does not exclude the diagnosis of epilepsy.     Gabriella Guile O Demesha Boorman

## 2023-12-25 NOTE — TOC Initial Note (Deleted)
 Transition of Care [facility]) - Initial/Assessment Note    Patient Details  Name: Alexandra Sanchez MRN: [id number] Date of Birth: 06/02/1965  Transition of Care [facility]) CM/SW Contact:    Landry DELENA Senters, RN Phone Number: 12/25/2023, 12:36 PM  Clinical Narrative:                 RR:[id number] history significant of hypertension, hyperlipidemia, COPD, epilepsy, and history of previous hospital AMA who presents to the emergency department due to several days onset of shortness of breath, syncopal episodes and seizures.  She complained of 4 episodes of syncope since onset of symptoms with last episode being last night and was followed with a seizure.  She complained of nonproductive cough and headache.   Patient lives in [place], lives with her S.O. and two adult children. Reports she has support at home, has transportation assistance if needed, S.O. will be ride home at d/c.   Patient has PCP, manages own medications, no DME at home. Patient left hospital AMA 2 days ago, reports she did pick up her medications but did not start taking them. Does not have a reason for this. She has not taken her Keppra  for the past year due to worry that her PCP would make her see a specialist again before continuing to prescribe Keppra . CM educated on the importance of taking her medications to control her seizures and keep her out of the hospital. Patient verbalizes understanding.   No apparent needs identified by CM. Will continue to follow.   Expected Discharge Plan: Home/Self Care Barriers to Discharge: No Barriers Identified   Patient Goals and CMS Choice            Expected Discharge Plan and Services       Living arrangements for the past 2 months: Single Family Home Expected Discharge Date: 12/25/23                                    Prior Living Arrangements/Services Living arrangements for the past 2 months: Single Family Home Lives with:: Self, Adult Children, Significant Other Patient  language and need for interpreter reviewed:: Yes Do you feel safe going back to the place where you live?: Yes      Need for Family Participation in Patient Care: Yes (Comment) Care giver support system in place?: Yes (comment)   Criminal Activity/Legal Involvement Pertinent to Current Situation/Hospitalization: No - Comment as needed  Activities of Daily Living   ADL Screening (condition at time of admission) Independently performs ADLs?: Yes (appropriate for developmental age) Is the patient deaf or have difficulty hearing?: No Does the patient have difficulty seeing, even when wearing glasses/contacts?: No  Permission Sought/Granted                  Emotional Assessment Appearance:: Developmentally appropriate Attitude/Demeanor/Rapport: Engaged Affect (typically observed): Calm Orientation: : Oriented to Self, Oriented to Place, Oriented to  Time, Oriented to Situation Alcohol / Substance Use: Not Applicable Psych Involvement: No (comment)  Admission diagnosis:  Seizure (HCC) [R56.9] Seizures (HCC) [R56.9] COPD exacerbation (HCC) [J44.1] Intractable epilepsy without status epilepticus, unspecified epilepsy type (HCC) [G40.919] Patient Active Problem List   Diagnosis Date Noted   Seizure (HCC) 12/23/2023   Anxiety and depression 12/21/2023   Essential hypertension 12/21/2023   Acute on chronic respiratory failure with hypoxia and hypercapnia (HCC) 12/20/2023   Acute hypoxic respiratory failure (HCC) 12/04/2023   COPD  with acute exacerbation (HCC) 12/04/2023   Dehydration 02/08/2022   Seizure-like activity (HCC) 02/07/2022   COPD (chronic obstructive pulmonary disease) (HCC) 02/07/2022   Anorexia nervosa (HCC) 02/06/2022   Seizures (HCC) 02/06/2022   Anxiety state 02/06/2022   Tobacco use disorder 02/06/2022   MDD (major depressive disorder) 02/05/2022   Suicidal ideation 02/04/2022   Recurrent seizures (HCC)    Acute suppurative parotitis 03/19/2020   PCP:   Jacques Arlon PARAS, MD Pharmacy:   [facility] - [place], [place] - [address] [place] [address] Phone: [phone] Fax: [phone]     Social Drivers of Health (SDOH) Social History: SDOH Screenings   Food Insecurity: No Food Insecurity (12/24/2023)  Housing: Low Risk (12/24/2023)  Transportation Needs: No Transportation Needs (12/24/2023)  Utilities: Not At Risk (12/24/2023)  Depression (PHQ2-9): High Risk (03/14/2022)  Financial Resource Strain: Low Risk (11/21/2023)   Received from [facility]  Physical Activity: Sufficiently Active (11/21/2023)   Received from [facility]  Social Connections: Socially Isolated (11/21/2023)   Received from [facility]  Stress: No Stress Concern Present (11/21/2023)   Received from [facility]  Tobacco Use: High Risk (12/23/2023)  Health Literacy: Low Risk (11/21/2023)   Received from [facility]   SDOH Interventions:     Readmission Risk Interventions     No data to display

## 2023-12-25 NOTE — Progress Notes (Signed)
 LTM VIDEO EEG discontinued - no skin breakdown at [facility].

## 2023-12-25 NOTE — Progress Notes (Signed)
 Oxygen  on RA at rest 87%, Oxygen  on RA with AMB 86%, Oxygen  on 1L O2 with activity 92%

## 2023-12-25 NOTE — Progress Notes (Signed)
°   12/25/23 1241 12/25/23 1246  Vitals  BP (!) 114/48 107/88  MAP (mmHg) 74 96  BP Method Automatic Automatic  Patient Position (if appropriate) Standing Standing  Pulse Rate 75 88   Orthostatic BP

## 2023-12-25 NOTE — Discharge Instructions (Signed)
 Do not drive, operate heavy machinery, perform activities at heights, swimming or participation in water activities or provide baby sitting services until you have seen by Primary MD or a Neurologist and advised to do so again.  Follow with Primary MD Jacques Arlon PARAS, MD in 7 days   Get CBC, CMP, Magnesium , 2 view Chest X ray -  checked next visit with your primary MD    Activity: As tolerated with Full fall precautions use walker/cane & assistance as needed  Disposition Home    Diet: Heart Healthy    Special Instructions: If you have smoked or chewed Tobacco  in the last 2 yrs please stop smoking, stop any regular Alcohol  and or any Recreational drug use.  On your next visit with your primary care physician please Get Medicines reviewed and adjusted.  Please request your Prim.MD to go over all Hospital Tests and Procedure/Radiological results at the follow up, please get all Hospital records sent to your Prim MD by signing hospital release before you go home.  If you experience worsening of your admission symptoms, develop shortness of breath, life threatening emergency, suicidal or homicidal thoughts you must seek medical attention immediately by calling 911 or calling your MD immediately  if symptoms less severe.  You Must read complete instructions/literature along with all the possible adverse reactions/side effects for all the Medicines you take and that have been prescribed to you. Take any new Medicines after you have completely understood and accpet all the possible adverse reactions/side effects.   Do not drive when taking Pain medications.  Do not take more than prescribed Pain, Sleep and Anxiety Medications  Wear Seat belts while driving.

## 2023-12-25 NOTE — Evaluation (Signed)
 Physical Therapy Brief Evaluation and Discharge Note Patient Details Name: Alexandra Sanchez MRN: [id number] DOB: 1965-06-08 Today's Date: 12/25/2023   History of Present Illness  58 yo F adm 12/12 from home with COPD exacerbation left AMA at [facility] and returned secondary to seizure. Pt with seizure witnessed by staff in ED . Pt transferred to [facility] Pt with workup for seizure COPD exacerbation PMH anxiety, COPD, depression, HLD, seizures. Cervical sx, R knee sx x 5  Clinical Impression  Pt is currently presenting at Ind for bed mobility, 11x sit to stand without UE support, no UE support required for high knees marching and heel toe raises standing at EOB. Mobility was limited due to video monitored EEG this session. Pt has assistance as needed at home. Currently pt is presenting at baseline level of functioning and no skilled physical therapy services recommended. Pt will be discharged from skilled physical therapy services at this time; please re-consult if further needs arise.           PT Assessment Patient does not need any further PT services  Assistance Needed at Discharge  PRN    Equipment Recommendations None recommended by PT     Precautions/Restrictions Precautions Precautions: Other (comment) Recall of Precautions/Restrictions: Intact Precaution/Restrictions Comments: seizure Restrictions Weight Bearing Restrictions Per Provider Order: No        Mobility  Bed Mobility   Supine/Sidelying to sit: Independent, HOB elevated Sit to supine/sidelying: Independent, HOB elevated    Transfers Overall transfer level: Independent Equipment used: None       General transfer comment: performed 11x no difficulty no UE support.    Ambulation/Gait           General Gait Details: limited due to pt was on video monitored EEG. pt marched in place with high knees 20x and performed heel/toe raises x 20 no UE support. No concerns for gait  Home Activity Instructions     Stairs Stairs:  (limited due to video monitored EEG. Pt demonstrates normal strength/balance with heel/toe raises, high knees marching and sit to stand. No concerns for stairs.)               Balance Overall balance assessment: Independent        Pertinent Vitals/Pain   Pain Assessment Pain Assessment: Faces Faces Pain Scale: Hurts a little bit Pain Location: head Pain Descriptors / Indicators: Aching Pain Intervention(s): Limited activity within patient's tolerance, Monitored during session     Home Living Family/patient expects to be discharged to:: Private residence Living Arrangements: Spouse/significant other;Children Available Help at Discharge: Family;Available PRN/intermittently Home Environment: Stairs to enter;No rail  Stairs-Number of Steps: about 10 Home Equipment: None        Prior Function Level of Independence: Independent      UE/LE Assessment   UE ROM/Strength/Tone/Coordination: WFL    LE ROM/Strength/Tone/Coordination: [facility]      Communication   Communication Communication: No apparent difficulties     Cognition Overall Cognitive Status: Appears within functional limits for tasks assessed/performed              Assessment/Plan           No Skilled PT Patient is independent with all acitivity/mobility    AMPAC 6 Clicks Help needed turning from your back to your side while in a flat bed without using bedrails?: None Help needed moving from lying on your back to sitting on the side of a flat bed without using bedrails?: None Help needed moving to and  from a bed to a chair (including a wheelchair)?: None Help needed standing up from a chair using your arms (e.g., wheelchair or bedside chair)?: None Help needed to walk in hospital room?: None Help needed climbing 3-5 steps with a railing? : None 6 Click Score: 24      End of Session   Activity Tolerance: Patient tolerated treatment well Patient left: in bed;with call  bell/phone within reach;with bed alarm set Nurse Communication: Mobility status       Time: 1020-1033 PT Time Calculation (min) (ACUTE ONLY): 13 min  Charges:   PT Evaluation $PT Eval Low Complexity: 1 Low      Dorothyann Maier, DPT, CLT  Acute Rehabilitation Services Office: [phone] (Secure chat preferred)   Dorothyann VEAR Maier  12/25/2023, 10:38 AM

## 2023-12-25 NOTE — Progress Notes (Signed)
 LTM maint complete - no skin breakdown under:  A2, CZ, C4

## 2023-12-25 NOTE — Procedures (Signed)
 Patient Name: Alexandra Sanchez  MRN: [id number]  Epilepsy Attending: Arlin MALVA Krebs  Referring Physician/Provider: Khaliqdina, Salman, MD  Duration: 12/24/2023 9145 to 12/25/2023 9145  Patient history: : 58 y.o. female  past medical history of  hypertension, hyperlipidemia, COPD with exacerbation and epilepsy, presenting with recurrent seizures.  EEG to evaluate for seizure   Level of alertness: Awake, asleep  AEDs during EEG study: LEV  Technical aspects: This EEG study was done with scalp electrodes positioned according to the 10-20 International system of electrode placement. Electrical activity was reviewed with band pass filter of 1-70Hz , sensitivity of 7 uV/mm, display speed of 65mm/sec with a 60Hz  notched filter applied as appropriate. EEG data were recorded continuously and digitally stored.  Video monitoring was available and reviewed as appropriate.  Description: The posterior dominant rhythm consists of 8-9 Hz activity of moderate voltage (25-35 uV) seen predominantly in posterior head regions, symmetric and reactive to eye opening and eye closing. Sleep was characterized by vertex waves, sleep spindles (12 to 14 Hz), maximal frontocentral region. Hyperventilation and photic stimulation were not performed.      IMPRESSION: This study is within normal limits. No seizures or epileptiform discharges were seen throughout the recording.   A normal interictal EEG does not exclude the diagnosis of epilepsy.     [name] O Deshan Hemmelgarn

## 2023-12-26 ENCOUNTER — Encounter (HOSPITAL_COMMUNITY): Payer: Self-pay

## 2023-12-26 ENCOUNTER — Other Ambulatory Visit: Payer: Self-pay

## 2023-12-26 ENCOUNTER — Emergency Department (HOSPITAL_COMMUNITY)
Admission: EM | Admit: 2023-12-26 | Discharge: 2023-12-26 | Discharge status: Against Medical Advice | Payer: MEDICAID | Attending: Emergency Medicine | Admitting: Emergency Medicine

## 2023-12-26 ENCOUNTER — Encounter (HOSPITAL_COMMUNITY): Payer: Self-pay | Admitting: Emergency Medicine

## 2023-12-26 ENCOUNTER — Inpatient Hospital Stay (HOSPITAL_COMMUNITY)
Admission: EM | Admit: 2023-12-26 | Discharge: 2023-12-26 | DRG: 189 | Discharge status: Against Medical Advice | Payer: MEDICAID | Attending: Internal Medicine | Admitting: Internal Medicine

## 2023-12-26 ENCOUNTER — Emergency Department (HOSPITAL_COMMUNITY): Payer: MEDICAID

## 2023-12-26 DIAGNOSIS — Z9049 Acquired absence of other specified parts of digestive tract: Secondary | ICD-10-CM | POA: Diagnosis not present

## 2023-12-26 DIAGNOSIS — J9601 Acute respiratory failure with hypoxia: Principal | ICD-10-CM | POA: Diagnosis present

## 2023-12-26 DIAGNOSIS — J441 Chronic obstructive pulmonary disease with (acute) exacerbation: Secondary | ICD-10-CM | POA: Diagnosis present

## 2023-12-26 DIAGNOSIS — R0602 Shortness of breath: Secondary | ICD-10-CM | POA: Diagnosis present

## 2023-12-26 DIAGNOSIS — Z9109 Other allergy status, other than to drugs and biological substances: Secondary | ICD-10-CM

## 2023-12-26 DIAGNOSIS — Z888 Allergy status to other drugs, medicaments and biological substances status: Secondary | ICD-10-CM | POA: Diagnosis not present

## 2023-12-26 DIAGNOSIS — Z881 Allergy status to other antibiotic agents status: Secondary | ICD-10-CM | POA: Diagnosis not present

## 2023-12-26 DIAGNOSIS — F32A Depression, unspecified: Secondary | ICD-10-CM | POA: Diagnosis not present

## 2023-12-26 DIAGNOSIS — J440 Chronic obstructive pulmonary disease with acute lower respiratory infection: Secondary | ICD-10-CM | POA: Insufficient documentation

## 2023-12-26 DIAGNOSIS — F419 Anxiety disorder, unspecified: Secondary | ICD-10-CM | POA: Diagnosis not present

## 2023-12-26 DIAGNOSIS — Z7951 Long term (current) use of inhaled steroids: Secondary | ICD-10-CM

## 2023-12-26 DIAGNOSIS — Z818 Family history of other mental and behavioral disorders: Secondary | ICD-10-CM | POA: Diagnosis not present

## 2023-12-26 DIAGNOSIS — E785 Hyperlipidemia, unspecified: Secondary | ICD-10-CM | POA: Insufficient documentation

## 2023-12-26 DIAGNOSIS — J40 Bronchitis, not specified as acute or chronic: Secondary | ICD-10-CM

## 2023-12-26 DIAGNOSIS — G40909 Epilepsy, unspecified, not intractable, without status epilepticus: Secondary | ICD-10-CM | POA: Insufficient documentation

## 2023-12-26 DIAGNOSIS — Z90710 Acquired absence of both cervix and uterus: Secondary | ICD-10-CM

## 2023-12-26 DIAGNOSIS — Z79899 Other long term (current) drug therapy: Secondary | ICD-10-CM

## 2023-12-26 DIAGNOSIS — F17210 Nicotine dependence, cigarettes, uncomplicated: Secondary | ICD-10-CM | POA: Diagnosis present

## 2023-12-26 DIAGNOSIS — Z83438 Family history of other disorder of lipoprotein metabolism and other lipidemia: Secondary | ICD-10-CM | POA: Diagnosis not present

## 2023-12-26 DIAGNOSIS — F418 Other specified anxiety disorders: Secondary | ICD-10-CM | POA: Diagnosis present

## 2023-12-26 DIAGNOSIS — E876 Hypokalemia: Secondary | ICD-10-CM | POA: Diagnosis not present

## 2023-12-26 DIAGNOSIS — J209 Acute bronchitis, unspecified: Secondary | ICD-10-CM | POA: Diagnosis present

## 2023-12-26 DIAGNOSIS — Z886 Allergy status to analgesic agent status: Secondary | ICD-10-CM

## 2023-12-26 DIAGNOSIS — Z803 Family history of malignant neoplasm of breast: Secondary | ICD-10-CM | POA: Diagnosis not present

## 2023-12-26 DIAGNOSIS — Z87892 Personal history of anaphylaxis: Secondary | ICD-10-CM

## 2023-12-26 DIAGNOSIS — R0902 Hypoxemia: Secondary | ICD-10-CM

## 2023-12-26 DIAGNOSIS — K219 Gastro-esophageal reflux disease without esophagitis: Secondary | ICD-10-CM | POA: Insufficient documentation

## 2023-12-26 DIAGNOSIS — Z833 Family history of diabetes mellitus: Secondary | ICD-10-CM

## 2023-12-26 DIAGNOSIS — Z91030 Bee allergy status: Secondary | ICD-10-CM | POA: Diagnosis not present

## 2023-12-26 DIAGNOSIS — Z91040 Latex allergy status: Secondary | ICD-10-CM

## 2023-12-26 DIAGNOSIS — I10 Essential (primary) hypertension: Secondary | ICD-10-CM | POA: Diagnosis present

## 2023-12-26 LAB — TROPONIN T, HIGH SENSITIVITY
Troponin T High Sensitivity: <15 ng/L (ref 0–19)
Troponin T High Sensitivity: <15 ng/L (ref 0–19)

## 2023-12-26 LAB — CBC WITH DIFFERENTIAL/PLATELET
Abs Immature Granulocytes: 0.03 K/uL (ref 0.00–0.07)
Abs Immature Granulocytes: 0.02 K/uL (ref 0.00–0.07)
Basophils Absolute: 0.1 K/uL (ref 0.0–0.1)
Basophils Absolute: 0 K/uL (ref 0.0–0.1)
Basophils Relative: 1 %
Basophils Relative: 0 %
Eosinophils Absolute: 0.2 K/uL (ref 0.0–0.5)
Eosinophils Absolute: 0 K/uL (ref 0.0–0.5)
Eosinophils Relative: 2 %
Eosinophils Relative: 0 %
HCT: 46.3 % — ABNORMAL HIGH (ref 36.0–46.0)
HCT: 46.4 % — ABNORMAL HIGH (ref 36.0–46.0)
Hemoglobin: 15 g/dL (ref 12.0–15.0)
Hemoglobin: 14.8 g/dL (ref 12.0–15.0)
Immature Granulocytes: 0 %
Immature Granulocytes: 0 %
Lymphocytes Relative: 30 %
Lymphocytes Relative: 7 %
Lymphs Abs: 2.9 K/uL (ref 0.7–4.0)
Lymphs Abs: 0.4 K/uL — ABNORMAL LOW (ref 0.7–4.0)
MCH: 27 pg (ref 26.0–34.0)
MCH: 26.8 pg (ref 26.0–34.0)
MCHC: 32.4 g/dL (ref 30.0–36.0)
MCHC: 31.9 g/dL (ref 30.0–36.0)
MCV: 83.4 fL (ref 80.0–100.0)
MCV: 83.9 fL (ref 80.0–100.0)
Monocytes Absolute: 0.7 K/uL (ref 0.1–1.0)
Monocytes Absolute: 0 K/uL — ABNORMAL LOW (ref 0.1–1.0)
Monocytes Relative: 7 %
Monocytes Relative: 1 %
Neutro Abs: 5.8 K/uL (ref 1.7–7.7)
Neutro Abs: 5.6 K/uL (ref 1.7–7.7)
Neutrophils Relative %: 60 %
Neutrophils Relative %: 92 %
Platelets: 325 K/uL (ref 150–400)
Platelets: 266 K/uL (ref 150–400)
RBC: 5.55 MIL/uL — ABNORMAL HIGH (ref 3.87–5.11)
RBC: 5.53 MIL/uL — ABNORMAL HIGH (ref 3.87–5.11)
RDW: 13.8 % (ref 11.5–15.5)
RDW: 13.7 % (ref 11.5–15.5)
WBC: 9.6 K/uL (ref 4.0–10.5)
WBC: 6.1 K/uL (ref 4.0–10.5)
nRBC: 0 % (ref 0.0–0.2)
nRBC: 0 % (ref 0.0–0.2)

## 2023-12-26 LAB — BASIC METABOLIC PANEL WITH GFR
Anion gap: 15 (ref 5–15)
BUN: 9 mg/dL (ref 6–20)
CO2: 24 mmol/L (ref 22–32)
Calcium: 9.6 mg/dL (ref 8.9–10.3)
Chloride: 101 mmol/L (ref 98–111)
Creatinine, Ser: 0.68 mg/dL (ref 0.44–1.00)
GFR, Estimated: >60 mL/min (ref >60)
Glucose, Bld: 108 mg/dL — ABNORMAL HIGH (ref 70–99)
Potassium: 3.4 mmol/L — ABNORMAL LOW (ref 3.5–5.1)
Sodium: 139 mmol/L (ref 135–145)

## 2023-12-26 LAB — COMPREHENSIVE METABOLIC PANEL WITH GFR
ALT: 16 U/L (ref 0–44)
AST: 22 U/L (ref 15–41)
Albumin: 4.4 g/dL (ref 3.5–5.0)
Alkaline Phosphatase: 178 U/L — ABNORMAL HIGH (ref 38–126)
Anion gap: 16 — ABNORMAL HIGH (ref 5–15)
BUN: 11 mg/dL (ref 6–20)
CO2: 23 mmol/L (ref 22–32)
Calcium: 9.3 mg/dL (ref 8.9–10.3)
Chloride: 101 mmol/L (ref 98–111)
Creatinine, Ser: 0.71 mg/dL (ref 0.44–1.00)
GFR, Estimated: >60 mL/min (ref >60)
Glucose, Bld: 196 mg/dL — ABNORMAL HIGH (ref 70–99)
Potassium: 3.9 mmol/L (ref 3.5–5.1)
Sodium: 139 mmol/L (ref 135–145)
Total Bilirubin: 0.4 mg/dL (ref 0.0–1.2)
Total Protein: 7.3 g/dL (ref 6.5–8.1)

## 2023-12-26 LAB — MAGNESIUM: Magnesium: 2 mg/dL (ref 1.7–2.4)

## 2023-12-26 LAB — VITAMIN B1: Vitamin B1 (Thiamine): 97.3 nmol/L (ref 66.5–200.0)

## 2023-12-26 LAB — RESP PANEL BY RT-PCR (RSV, FLU A&B, COVID)  RVPGX2
Influenza A by PCR: NEGATIVE
Influenza B by PCR: NEGATIVE
Resp Syncytial Virus by PCR: NEGATIVE
SARS Coronavirus 2 by RT PCR: NEGATIVE

## 2023-12-26 MED ORDER — SODIUM CHLORIDE 0.9 % IV BOLUS
1000.0000 mL | Freq: Once | INTRAVENOUS | Status: AC
  Administered 2023-12-26: 10:00:00 1000 mL via INTRAVENOUS

## 2023-12-26 MED ORDER — VITAMIN B-12 1000 MCG PO TABS: 1000.0000 ug | ORAL_TABLET | Freq: Every day | ORAL | Status: DC

## 2023-12-26 MED ORDER — LEVETIRACETAM 500 MG PO TABS: 500.0000 mg | ORAL_TABLET | Freq: Two times a day (BID) | ORAL | Status: DC

## 2023-12-26 MED ORDER — SODIUM CHLORIDE 0.9 % IV SOLN
100.0000 mg | Freq: Two times a day (BID) | INTRAVENOUS | Status: DC
  Filled 2023-12-26: qty 100

## 2023-12-26 MED ORDER — FOLIC ACID 1 MG PO TABS: 1.0000 mg | ORAL_TABLET | Freq: Every day | ORAL | Status: DC

## 2023-12-26 MED ORDER — ATORVASTATIN CALCIUM 40 MG PO TABS: 40.0000 mg | ORAL_TABLET | Freq: Every day | ORAL | Status: DC

## 2023-12-26 MED ORDER — DOXYCYCLINE HYCLATE 100 MG PO TABS
100.0000 mg | ORAL_TABLET | Freq: Two times a day (BID) | ORAL | Status: DC
  Administered 2023-12-26: 11:00:00 100 mg via ORAL
  Filled 2023-12-26: qty 1

## 2023-12-26 MED ORDER — POTASSIUM CHLORIDE CRYS ER 20 MEQ PO TBCR
40.0000 meq | EXTENDED_RELEASE_TABLET | Freq: Once | ORAL | Status: AC
  Administered 2023-12-26: 06:00:00 40 meq via ORAL
  Filled 2023-12-26: qty 2

## 2023-12-26 MED ORDER — ONDANSETRON HCL 4 MG/2ML IJ SOLN: 4.0000 mg | Freq: Four times a day (QID) | INTRAMUSCULAR | Status: DC | PRN

## 2023-12-26 MED ORDER — IPRATROPIUM-ALBUTEROL 0.5-2.5 (3) MG/3ML IN SOLN
3.0000 mL | Freq: Once | RESPIRATORY_TRACT | Status: AC
  Administered 2023-12-26: 10:00:00 3 mL via RESPIRATORY_TRACT
  Filled 2023-12-26: qty 3

## 2023-12-26 MED ORDER — LEVETIRACETAM 500 MG PO TABS
500.0000 mg | ORAL_TABLET | Freq: Two times a day (BID) | ORAL | Status: DC
  Administered 2023-12-26: 06:00:00 500 mg via ORAL
  Filled 2023-12-26: qty 1

## 2023-12-26 MED ORDER — IPRATROPIUM-ALBUTEROL 0.5-2.5 (3) MG/3ML IN SOLN
3.0000 mL | Freq: Three times a day (TID) | RESPIRATORY_TRACT | Status: DC
  Administered 2023-12-26: 14:00:00 3 mL via RESPIRATORY_TRACT
  Filled 2023-12-26: qty 3

## 2023-12-26 MED ORDER — ACETAMINOPHEN 650 MG RE SUPP: 650.0000 mg | Freq: Four times a day (QID) | RECTAL | Status: DC | PRN

## 2023-12-26 MED ORDER — ATORVASTATIN CALCIUM 40 MG PO TABS
40.0000 mg | ORAL_TABLET | Freq: Every day | ORAL | Status: DC
  Administered 2023-12-26: 11:00:00 40 mg via ORAL
  Filled 2023-12-26: qty 1

## 2023-12-26 MED ORDER — FLUTICASONE FUROATE-VILANTEROL 100-25 MCG/ACT IN AEPB: 1.0000 | INHALATION_SPRAY | Freq: Every day | RESPIRATORY_TRACT | Status: DC

## 2023-12-26 MED ORDER — METHYLPREDNISOLONE SODIUM SUCC 40 MG IJ SOLR: 40.0000 mg | Freq: Every day | INTRAMUSCULAR | Status: DC

## 2023-12-26 MED ORDER — ONDANSETRON HCL 4 MG PO TABS: 4.0000 mg | ORAL_TABLET | Freq: Four times a day (QID) | ORAL | Status: DC | PRN

## 2023-12-26 MED ORDER — ACETAMINOPHEN 325 MG PO TABS
650.0000 mg | ORAL_TABLET | Freq: Four times a day (QID) | ORAL | Status: DC | PRN
  Administered 2023-12-26: 11:00:00 650 mg via ORAL
  Filled 2023-12-26: qty 2

## 2023-12-26 MED ORDER — DM-GUAIFENESIN ER 30-600 MG PO TB12
1.0000 | ORAL_TABLET | Freq: Two times a day (BID) | ORAL | Status: DC
  Administered 2023-12-26: 11:00:00 1 via ORAL
  Filled 2023-12-26: qty 1

## 2023-12-26 MED ORDER — ARFORMOTEROL TARTRATE 15 MCG/2ML IN NEBU
15.0000 ug | INHALATION_SOLUTION | Freq: Two times a day (BID) | RESPIRATORY_TRACT | Status: DC
  Administered 2023-12-26: 11:00:00 15 ug via RESPIRATORY_TRACT
  Filled 2023-12-26: qty 2

## 2023-12-26 MED ORDER — BUDESONIDE 0.5 MG/2ML IN SUSP
0.5000 mg | Freq: Two times a day (BID) | RESPIRATORY_TRACT | Status: DC
  Administered 2023-12-26: 11:00:00 0.5 mg via RESPIRATORY_TRACT
  Filled 2023-12-26: qty 2

## 2023-12-26 MED ORDER — PANTOPRAZOLE SODIUM 40 MG PO TBEC
40.0000 mg | DELAYED_RELEASE_TABLET | Freq: Every day | ORAL | Status: DC
  Administered 2023-12-26: 11:00:00 40 mg via ORAL
  Filled 2023-12-26: qty 1

## 2023-12-26 MED ORDER — ENOXAPARIN SODIUM 40 MG/0.4ML IJ SOSY: 40.0000 mg | PREFILLED_SYRINGE | INTRAMUSCULAR | Status: DC

## 2023-12-26 MED ORDER — VITAMIN B-12 1000 MCG PO TABS
1000.0000 ug | ORAL_TABLET | Freq: Every day | ORAL | Status: DC
  Administered 2023-12-26: 11:00:00 1000 ug via ORAL
  Filled 2023-12-26: qty 1

## 2023-12-26 MED ORDER — IPRATROPIUM-ALBUTEROL 0.5-2.5 (3) MG/3ML IN SOLN
3.0000 mL | Freq: Once | RESPIRATORY_TRACT | Status: AC
  Administered 2023-12-26: 03:00:00 3 mL via RESPIRATORY_TRACT
  Filled 2023-12-26: qty 3

## 2023-12-26 MED ORDER — METHYLPREDNISOLONE SODIUM SUCC 40 MG IJ SOLR: 40.0000 mg | Freq: Two times a day (BID) | INTRAMUSCULAR | Status: DC

## 2023-12-26 MED ORDER — GUAIFENESIN-DM 100-10 MG/5ML PO SYRP: 5.0000 mL | ORAL_SOLUTION | ORAL | Status: DC | PRN

## 2023-12-26 MED ORDER — BUSPIRONE HCL 5 MG PO TABS
5.0000 mg | ORAL_TABLET | Freq: Three times a day (TID) | ORAL | Status: DC
  Administered 2023-12-26: 11:00:00 5 mg via ORAL
  Filled 2023-12-26 (×2): qty 1

## 2023-12-26 MED ORDER — IPRATROPIUM-ALBUTEROL 0.5-2.5 (3) MG/3ML IN SOLN
3.0000 mL | Freq: Once | RESPIRATORY_TRACT | Status: AC
  Administered 2023-12-26: 06:00:00 3 mL via RESPIRATORY_TRACT
  Filled 2023-12-26: qty 3

## 2023-12-26 MED ORDER — AMLODIPINE BESYLATE 5 MG PO TABS
5.0000 mg | ORAL_TABLET | Freq: Every day | ORAL | Status: DC
  Administered 2023-12-26: 11:00:00 5 mg via ORAL
  Filled 2023-12-26: qty 1

## 2023-12-26 MED ORDER — POTASSIUM CHLORIDE CRYS ER 20 MEQ PO TBCR
40.0000 meq | EXTENDED_RELEASE_TABLET | Freq: Once | ORAL | Status: AC
  Administered 2023-12-26: 11:00:00 40 meq via ORAL
  Filled 2023-12-26: qty 2

## 2023-12-26 MED ORDER — METHYLPREDNISOLONE SODIUM SUCC 125 MG IJ SOLR
125.0000 mg | Freq: Once | INTRAMUSCULAR | Status: AC
  Administered 2023-12-26: 10:00:00 125 mg via INTRAVENOUS
  Filled 2023-12-26: qty 2

## 2023-12-26 MED ORDER — IPRATROPIUM-ALBUTEROL 0.5-2.5 (3) MG/3ML IN SOLN: 3.0000 mL | RESPIRATORY_TRACT | Status: DC

## 2023-12-26 MED ORDER — NICOTINE 21 MG/24HR TD PT24: 21.0000 mg | MEDICATED_PATCH | Freq: Every day | TRANSDERMAL | Status: DC | PRN

## 2023-12-26 MED ORDER — METHYLPREDNISOLONE SODIUM SUCC 125 MG IJ SOLR
125.0000 mg | Freq: Once | INTRAMUSCULAR | Status: AC
  Administered 2023-12-26: 02:00:00 125 mg via INTRAVENOUS
  Filled 2023-12-26: qty 2

## 2023-12-26 MED ADMIN — Acetaminophen Tab 500 MG: 1000 mg | ORAL | @ 06:00:00 | NDC 00904673061

## 2023-12-26 MED FILL — Acetaminophen Tab 500 MG: 1000.0000 mg | ORAL | Qty: 2 | Status: AC

## 2023-12-26 NOTE — ED Notes (Signed)
 Patient seen by hospitalist informed that patient will be leaving AMA. AMA Form signing.

## 2023-12-26 NOTE — Assessment & Plan Note (Signed)
-   Will check magnesium  - Replete electrolytes and follow trend - Patient drop in potassium level most likely associated with continued use of albuterol .

## 2023-12-26 NOTE — Assessment & Plan Note (Signed)
-   No suicidal ideation or hallucination - Low-dose of BuSpar  has been started to help controlling symptoms - Outpatient follow-up with psychiatry service recommended.

## 2023-12-26 NOTE — Assessment & Plan Note (Signed)
 Continue PPI.

## 2023-12-26 NOTE — ED Notes (Signed)
 PT alert, NAD, calm, interactive, comfortable lying on R side. Endorses sob, non-productive cough, feel cold, and diarrhea 3d ago. Last BM 3d ago. Mentions intermittent HA and abd pain. Denies NV, fever. Not orthopneic. Rhonchi, course crackles and expiratory wheeze noted. EDPA at [facility].

## 2023-12-26 NOTE — ED Triage Notes (Signed)
 Pt is back to be admitted for COPD. Left ama earlier this am to take her daughter to a job interview. Pt is SOB while ambulating to room.

## 2023-12-26 NOTE — ED Triage Notes (Signed)
 Pt c/o SOB d/t COPD. Was just discharged from [facility] for seizures and states she does not feel any better.

## 2023-12-26 NOTE — ED Notes (Signed)
 Back to stretcher, RT at [facility]

## 2023-12-26 NOTE — ED Provider Notes (Signed)
 " [place] EMERGENCY DEPARTMENT AT [facility] Provider Note   CSN: [id number] Arrival date & time: 12/26/23  9144     Patient presents with: Shortness of Breath   Alexandra Sanchez is a 58 y.o. female.   Patient is a 58 year old female who presents to the emergency department with a chief complaint of ongoing shortness of breath.  She was in the emergency department last night and was to be admitted but had to leave the emergency department AGAINST MEDICAL ADVICE as she had to take her daughter to a job interview.  She did return as requested to be admitted.  She admits to ongoing shortness of breath at this time as well as associated headache.  Patient denies any associated chest pain, abdominal pain, nausea, vomiting.  She has had some intermittent diarrhea.   Shortness of Breath      Prior to Admission medications  Medication Sig Start Date End Date Taking? Authorizing Provider  albuterol  (VENTOLIN  HFA) 108 (90 Base) MCG/ACT inhaler Inhale 2 puffs into the lungs every 4 (four) hours as needed for wheezing or shortness of breath. 12/21/23   Pearlean Manus, MD  amLODipine  (NORVASC ) 5 MG tablet Take 1 tablet (5 mg total) by mouth daily. 12/21/23   Pearlean Manus, MD  atorvastatin  (LIPITOR) 40 MG tablet Take 1 tablet (40 mg total) by mouth daily. 12/21/23   Pearlean Manus, MD  budesonide -formoterol  (SYMBICORT) 160-4.5 MCG/ACT inhaler Inhale 1-2 puffs into the lungs as needed.    [provider]  cyanocobalamin  (VITAMIN B12) 1000 MCG tablet Take 1 tablet (1,000 mcg total) by mouth daily. You have vitamin b12 deficiency.  You should follow up with your PCP outpatient about supplementation, you may need injections.  This is probably causing the burning in your hands and feet. 12/25/23 01/24/24  Dennise Lavada POUR, MD  doxycycline  (VIBRAMYCIN ) 100 MG capsule Take 1 capsule (100 mg total) by mouth 2 (two) times daily for 5 days. Patient not taking: Reported on  12/22/2023 12/21/23 12/26/23  Pearlean Manus, MD  folic acid  (FOLVITE ) 1 MG tablet Take 1 tablet (1 mg total) by mouth daily. 12/25/23 01/24/24  Dennise Lavada POUR, MD  guaiFENesin  (MUCINEX ) 600 MG 12 hr tablet Take 1 tablet (600 mg total) by mouth 2 (two) times daily. Patient not taking: Reported on 12/23/2023 12/21/23   Pearlean Manus, MD  ipratropium-albuterol  (DUONEB) 0.5-2.5 (3) MG/3ML SOLN Inhale 3 mLs into the lungs in the morning, at noon, in the evening, and at bedtime. Patient taking differently: Inhale 3 mLs into the lungs every 6 (six) hours as needed (SOB and wheezing). 12/21/23   Pearlean Manus, MD  levETIRAcetam  (KEPPRA ) 500 MG tablet Take 1 tablet (500 mg total) by mouth 2 (two) times daily. Follow with your outpatient provider for refills. 12/25/23 01/24/24  Dennise Lavada POUR, MD  nicotine  (NICODERM CQ  - DOSED IN MG/24 HOURS) 21 mg/24hr patch Place 1 patch (21 mg total) onto the skin daily as needed (smoking cessation). 12/25/23   Dennise Lavada POUR, MD  pantoprazole  (PROTONIX ) 40 MG tablet Take 1 tablet (40 mg total) by mouth daily. Patient not taking: Reported on 12/22/2023 12/22/23   Pearlean Manus, MD    Allergies: Aspirin, Bee pollen, Bee venom, Haloperidol, Haloperidol lactate, Latex, and Erythromycin    Review of Systems  Respiratory:  Positive for shortness of breath.   All other systems reviewed and are negative.   Updated Vital Signs BP (!) 169/88   Pulse (!) 116   Resp 13  Ht 5' 3 (1.6 m)   Wt 58 kg   SpO2 93%   BMI 22.65 kg/m   Physical Exam Vitals and nursing note reviewed.  Constitutional:      General: She is not in acute distress.    Appearance: Normal appearance. She is not ill-appearing.  HENT:     Head: Normocephalic and atraumatic.     Nose: Nose normal.     Mouth/Throat:     Mouth: Mucous membranes are moist.  Eyes:     Extraocular Movements: Extraocular movements intact.     Conjunctiva/sclera: Conjunctivae normal.     Pupils:  Pupils are equal, round, and reactive to light.  Cardiovascular:     Rate and Rhythm: Normal rate and regular rhythm.     Pulses: Normal pulses.     Heart sounds: Normal heart sounds. No murmur heard.    No gallop.  Pulmonary:     Effort: Pulmonary effort is normal. Tachypnea present.     Breath sounds: Wheezing present. No decreased breath sounds, rhonchi or rales.  Chest:     Chest wall: No mass, tenderness or edema.  Abdominal:     General: Abdomen is flat. Bowel sounds are normal.     Palpations: Abdomen is soft.     Tenderness: There is no abdominal tenderness. There is no guarding.  Musculoskeletal:        General: Normal range of motion.     Cervical back: Normal range of motion and neck supple.     Right lower leg: No edema.     Left lower leg: No edema.  Skin:    General: Skin is warm and dry.  Neurological:     General: No focal deficit present.     Mental Status: She is alert and oriented to person, place, and time. Mental status is at baseline.  Psychiatric:        Mood and Affect: Mood normal.        Behavior: Behavior normal.        Thought Content: Thought content normal.        Judgment: Judgment normal.     (all labs ordered are listed, but only abnormal results are displayed) Labs Reviewed  COMPREHENSIVE METABOLIC PANEL WITH GFR  CBC WITH DIFFERENTIAL/PLATELET    EKG: None  Radiology: DG Chest 1 View Result Date: 12/26/2023 EXAM: 1 VIEW(S) XRAY OF THE CHEST 12/26/2023 02:24:00 AM COMPARISON: Comparison to study dated 12/23/2023. CLINICAL HISTORY: sob FINDINGS: LUNGS AND PLEURA: Stable pulmonary hyperinflation in keeping with changes of underlying COPD. No superimposed focal pulmonary infiltrate. No pleural effusion. No pneumothorax. HEART AND MEDIASTINUM: No acute abnormality of the cardiac and mediastinal silhouettes. BONES AND SOFT TISSUES: Partially visualized cervical spine surgical hardware. No acute osseous abnormality. IMPRESSION: 1. No focal  pulmonary infiltrate. 2. Stable pulmonary hyperinflation consistent with chronic COPD. Electronically signed by: Dorethia Molt MD 12/26/2023 02:29 AM EST RP Workstation: [id number]   Overnight EEG with video Result Date: 12/25/2023 Alexandra Arlin KIDD, MD     12/25/2023 10:21 AM Patient Name: Alexandra Sanchez MRN: [id number] Epilepsy Attending: Arlin Sanchez Alexandra Referring Physician/Provider: Khaliqdina, Salman, MD Duration: 12/24/2023 9145 to 12/25/2023 0854 Patient history: : 58 y.o. female  past medical history of  hypertension, hyperlipidemia, COPD with exacerbation and epilepsy, presenting with recurrent seizures.  EEG to evaluate for seizure Level of alertness: Awake, asleep AEDs during EEG study: LEV Technical aspects: This EEG study was done with scalp electrodes positioned according to the 10-20 International  system of electrode placement. Electrical activity was reviewed with band pass filter of 1-70Hz , sensitivity of 7 uV/mm, display speed of 32mm/sec with a 60Hz  notched filter applied as appropriate. EEG data were recorded continuously and digitally stored.  Video monitoring was available and reviewed as appropriate. Description: The posterior dominant rhythm consists of 8-9 Hz activity of moderate voltage (25-35 uV) seen predominantly in posterior head regions, symmetric and reactive to eye opening and eye closing. Sleep was characterized by vertex waves, sleep spindles (12 to 14 Hz), maximal frontocentral region. Hyperventilation and photic stimulation were not performed.    IMPRESSION: This study is within normal limits. No seizures or epileptiform discharges were seen throughout the recording.  A normal interictal EEG does not exclude the diagnosis of epilepsy.   Priyanka O Yadav     Procedures   Medications Ordered in the ED  ipratropium-albuterol  (DUONEB) 0.5-2.5 (3) MG/3ML nebulizer solution 3 mL (has no administration in time range)  ipratropium-albuterol  (DUONEB) 0.5-2.5 (3) MG/3ML  nebulizer solution 3 mL (has no administration in time range)  methylPREDNISolone  sodium succinate (SOLU-MEDROL ) 125 mg/2 mL injection 125 mg (has no administration in time range)                                    Medical Decision Making Amount and/or Complexity of Data Reviewed Labs: ordered.  Risk Prescription drug management. Decision regarding hospitalization.   This patient presents to the ED for concern of dyspnea, cough, this involves an extensive number of treatment options, and is a complaint that carries with it a high risk of complications and morbidity.  The differential diagnosis includes COPD, CHF, pneumonia, pneumothorax, hemothorax, ACS, pulmonary embolus, pericarditis, myocarditis, endocarditis   Co morbidities that complicate the patient evaluation  COPD   Additional history obtained:  Additional history obtained from medical records External records from outside source obtained and reviewed including medical records    Consultations Obtained:  I requested consultation with the hospitalist,  and discussed lab and imaging findings as well as pertinent plan - they recommend: Admission   Problem List / ED Course / Critical interventions / Medication management  Patient is doing well at this time and does remain stable.  Discussed with patient we will plan for admission to the hospital service given her ongoing intermittent hypoxia as well as COPD exacerbation.  Patient already had labs checked early this morning and repeat CBC and CMP were entered.  Did repeat steroids as well as breathing treatments.  Did discuss patient case with Dr. Ricky with the hospitalist service who has excepted for admission. I ordered medication including Solu-Medrol , DuoNeb, IV fluids for COPD exacerbation Reevaluation of the patient after these medicines showed that the patient improved I have reviewed the patients home medicines and have made adjustments as needed   Social  Determinants of Health:  None   Test / Admission - Considered:  Admission     Final diagnoses:  None    ED Discharge Orders     None          Daralene Lonni JONETTA DEVONNA 12/26/23 1018    Suzette Pac, MD 01/07/24 1554  "

## 2023-12-26 NOTE — ED Notes (Addendum)
 I went to room to tell pt she was next in line for a bed and that it should be tonight and she removed her own IV and stated I'm not staying in this fucking ER another night, I'll see my own doctor tomorrow and be done in less then an hour.  Ricky MD aware.

## 2023-12-26 NOTE — ED Notes (Signed)
 Pt states she does not want to stay here any longer if she is not getting a bed.  I told pt I would check on bed status and made attending aware.

## 2023-12-26 NOTE — Assessment & Plan Note (Signed)
-   In the setting of acute bronchitis - Oral doxycycline  has been started - Will provide steroids, bronchodilators/nebulizer management, mucolytic's and the use of flutter valve. - 2 L nasal cannula supplementation present at time of admission especially with exertion - Assess desaturation screening and arrange for home oxygen  if needed -Follow clinical response and continue supportive care.

## 2023-12-26 NOTE — ED Notes (Signed)
 Report called to inpatient floor nurse. Patient refusing to allow staff to take her up to inpatient and requesting to see MD. Dr. Shona notified of current refusal.

## 2023-12-26 NOTE — ED Notes (Signed)
 Patient ambulated around nurses station twice by NT. Patient pulse ox reading 95% on room air, then dropped to 84% on last 33ft from returning to room. MD made aware.
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
## 2023-12-26 NOTE — ED Notes (Signed)
 Admitting MD at [facility].
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
## 2023-12-26 NOTE — ED Notes (Signed)
 Patient requesting to speak to MD. stating can't I Just get oxyen sent home with me.  MD notified.

## 2023-12-26 NOTE — Assessment & Plan Note (Signed)
-   No seizure activity appreciated - Continue Keppra .

## 2023-12-26 NOTE — ED Notes (Signed)
Up to b/r, steady gait 

## 2023-12-26 NOTE — Assessment & Plan Note (Signed)
 Continue statin

## 2023-12-26 NOTE — ED Provider Notes (Signed)
 [place] [facility] AT [facility] Provider Note   CSN: [id number] Arrival date & time: 12/26/23  9891     Patient presents with: Shortness of Breath   Alexandra Sanchez is a 58 y.o. female.   58 year old female presents for evaluation of shortness of breath.  States she has a history of COPD and she is also.  She states she was treated with steroids and breathing treatments at an outside hospital yesterday and was discharged.  States is not feeling much better.  She states she has been treatment at home today without improvement in her symptoms.  Denies any chest pain, cough, fevers, or any other symptoms or concerns.   Shortness of Breath Associated symptoms: no abdominal pain, no chest pain, no cough, no ear pain, no fever, no rash, no sore throat and no vomiting        Prior to Admission medications  Medication Sig Start Date End Date Taking? Authorizing Provider  albuterol  (VENTOLIN  HFA) 108 (90 Base) MCG/ACT inhaler Inhale 2 puffs into the lungs every 4 (four) hours as needed for wheezing or shortness of breath. 12/21/23   Pearlean Manus, MD  amLODipine  (NORVASC ) 5 MG tablet Take 1 tablet (5 mg total) by mouth daily. 12/21/23   Pearlean Manus, MD  atorvastatin  (LIPITOR) 40 MG tablet Take 1 tablet (40 mg total) by mouth daily. 12/21/23   Pearlean Manus, MD  budesonide -formoterol  (SYMBICORT) 160-4.5 MCG/ACT inhaler Inhale 1-2 puffs into the lungs as needed.    [provider]  cyanocobalamin  (VITAMIN B12) 1000 MCG tablet Take 1 tablet (1,000 mcg total) by mouth daily. You have vitamin b12 deficiency.  You should follow up with your PCP outpatient about supplementation, you may need injections.  This is probably causing the burning in your hands and feet. 12/25/23 01/24/24  Dennise Lavada POUR, MD  doxycycline  (VIBRAMYCIN ) 100 MG capsule Take 1 capsule (100 mg total) by mouth 2 (two) times daily for 5 days. Patient not taking: Reported on 12/22/2023  12/21/23 12/26/23  Pearlean Manus, MD  folic acid  (FOLVITE ) 1 MG tablet Take 1 tablet (1 mg total) by mouth daily. 12/25/23 01/24/24  Dennise Lavada POUR, MD  guaiFENesin  (MUCINEX ) 600 MG 12 hr tablet Take 1 tablet (600 mg total) by mouth 2 (two) times daily. Patient not taking: Reported on 12/23/2023 12/21/23   Pearlean Manus, MD  ipratropium-albuterol  (DUONEB) 0.5-2.5 (3) MG/3ML SOLN Inhale 3 mLs into the lungs in the morning, at noon, in the evening, and at bedtime. Patient taking differently: Inhale 3 mLs into the lungs every 6 (six) hours as needed (SOB and wheezing). 12/21/23   Pearlean Manus, MD  levETIRAcetam  (KEPPRA ) 500 MG tablet Take 1 tablet (500 mg total) by mouth 2 (two) times daily. Follow with your outpatient provider for refills. 12/25/23 01/24/24  Dennise Lavada POUR, MD  nicotine  (NICODERM CQ  - DOSED IN MG/24 HOURS) 21 mg/24hr patch Place 1 patch (21 mg total) onto the skin daily as needed (smoking cessation). 12/25/23   Dennise Lavada POUR, MD  pantoprazole  (PROTONIX ) 40 MG tablet Take 1 tablet (40 mg total) by mouth daily. Patient not taking: Reported on 12/22/2023 12/22/23   Pearlean Manus, MD    Allergies: Aspirin, Bee pollen, Bee venom, Haloperidol, Haloperidol lactate, Latex, and Erythromycin    Review of Systems  Constitutional:  Negative for chills and fever.  HENT:  Negative for ear pain and sore throat.   Eyes:  Negative for pain and visual disturbance.  Respiratory:  Positive for  shortness of breath. Negative for cough.   Cardiovascular:  Negative for chest pain and palpitations.  Gastrointestinal:  Negative for abdominal pain and vomiting.  Genitourinary:  Negative for dysuria and hematuria.  Musculoskeletal:  Negative for arthralgias and back pain.  Skin:  Negative for color change and rash.  Neurological:  Negative for seizures and syncope.  All other systems reviewed and are negative.   Updated Vital Signs BP 135/80   Pulse 89   Temp 98.1 F (36.7 C)  (Oral)   Resp 17   SpO2 90%   Physical Exam Vitals and nursing note reviewed.  Constitutional:      General: She is not in acute distress.    Appearance: She is well-developed. She is not ill-appearing.  HENT:     Head: Normocephalic and atraumatic.  Eyes:     Conjunctiva/sclera: Conjunctivae normal.  Cardiovascular:     Rate and Rhythm: Normal rate and regular rhythm.     Heart sounds: No murmur heard. Pulmonary:     Effort: Pulmonary effort is normal. No respiratory distress.     Breath sounds: Wheezing present.  Abdominal:     Palpations: Abdomen is soft.     Tenderness: There is no abdominal tenderness.  Musculoskeletal:        General: No swelling.     Cervical back: Neck supple.  Skin:    General: Skin is warm and dry.     Capillary Refill: Capillary refill takes less than 2 seconds.  Neurological:     Mental Status: She is alert.  Psychiatric:        Mood and Affect: Mood normal.     (all labs ordered are listed, but only abnormal results are displayed) Labs Reviewed  BASIC METABOLIC PANEL WITH GFR - Abnormal; Notable for the following components:      Result Value   Potassium 3.4 (*)    Glucose, Bld 108 (*)    All other components within normal limits  CBC WITH DIFFERENTIAL/PLATELET - Abnormal; Notable for the following components:   RBC 5.55 (*)    HCT 46.3 (*)    All other components within normal limits  RESP PANEL BY RT-PCR (RSV, FLU A&B, COVID)  [id number]  TROPONIN T, HIGH SENSITIVITY  TROPONIN T, HIGH SENSITIVITY    EKG: None  Radiology: DG Chest 1 View Result Date: 12/26/2023 EXAM: 1 VIEW(S) XRAY OF THE CHEST 12/26/2023 02:24:00 AM COMPARISON: Comparison to study dated 12/23/2023. CLINICAL HISTORY: sob FINDINGS: LUNGS AND PLEURA: Stable pulmonary hyperinflation in keeping with changes of underlying COPD. No superimposed focal pulmonary infiltrate. No pleural effusion. No pneumothorax. HEART AND MEDIASTINUM: No acute abnormality of the cardiac and  mediastinal silhouettes. BONES AND SOFT TISSUES: Partially visualized cervical spine surgical hardware. No acute osseous abnormality. IMPRESSION: 1. No focal pulmonary infiltrate. 2. Stable pulmonary hyperinflation consistent with chronic COPD. Electronically signed by: Dorethia Molt MD 12/26/2023 02:29 AM EST RP Workstation: [id number]   Overnight EEG with video Result Date: 12/25/2023 Shelton Arlin KIDD, MD     12/25/2023 10:21 AM Patient Name: Tamsen Reist Kissling MRN: [id number] Epilepsy Attending: Arlin KIDD Shelton Referring Physician/Provider: Khaliqdina, Salman, MD Duration: 12/24/2023 9145 to 12/25/2023 0854 Patient history: : 58 y.o. female  past medical history of  hypertension, hyperlipidemia, COPD with exacerbation and epilepsy, presenting with recurrent seizures.  EEG to evaluate for seizure Level of alertness: Awake, asleep AEDs during EEG study: LEV Technical aspects: This EEG study was done with scalp electrodes positioned according to the 10-20 International  system of electrode placement. Electrical activity was reviewed with band pass filter of 1-70Hz , sensitivity of 7 uV/mm, display speed of 40mm/sec with a 60Hz  notched filter applied as appropriate. EEG data were recorded continuously and digitally stored.  Video monitoring was available and reviewed as appropriate. Description: The posterior dominant rhythm consists of 8-9 Hz activity of moderate voltage (25-35 uV) seen predominantly in posterior head regions, symmetric and reactive to eye opening and eye closing. Sleep was characterized by vertex waves, sleep spindles (12 to 14 Hz), maximal frontocentral region. Hyperventilation and photic stimulation were not performed.    IMPRESSION: This study is within normal limits. No seizures or epileptiform discharges were seen throughout the recording.  A normal interictal EEG does not exclude the diagnosis of epilepsy.   Priyanka O Yadav     .Critical Care  Performed by: Gennaro Duwaine CROME,  DO Authorized by: Gennaro Duwaine CROME, DO   Critical care provider statement:    Critical care time (minutes):  30   Critical care time was exclusive of:  Separately billable procedures and treating other patients and teaching time   Critical care was necessary to treat or prevent imminent or life-threatening deterioration of the following conditions:  Respiratory failure   Critical care was time spent personally by me on the following activities:  Development of treatment plan with patient or surrogate, discussions with consultants, evaluation of patient's response to treatment, examination of patient, ordering and review of laboratory studies, ordering and review of radiographic studies, ordering and performing treatments and interventions, pulse oximetry, re-evaluation of patient's condition and review of old charts   Care discussed with: admitting provider      Medications Ordered in the ED  ipratropium-albuterol  (DUONEB) 0.5-2.5 (3) MG/3ML nebulizer solution 3 mL (has no administration in time range)  enoxaparin  (LOVENOX ) injection 40 mg (has no administration in time range)  ipratropium-albuterol  (DUONEB) 0.5-2.5 (3) MG/3ML nebulizer solution 3 mL (has no administration in time range)  methylPREDNISolone  sodium succinate (SOLU-MEDROL ) 40 mg/mL injection 40 mg (has no administration in time range)  doxycycline  (VIBRAMYCIN ) 100 mg in sodium chloride  0.9 % 250 mL IVPB (has no administration in time range)  levETIRAcetam  (KEPPRA ) tablet 500 mg (500 mg Oral Given 12/26/23 0548)  folic acid  (FOLVITE ) tablet 1 mg (has no administration in time range)  cyanocobalamin  (VITAMIN B12) tablet 1,000 mcg (has no administration in time range)  fluticasone  furoate-vilanterol (BREO ELLIPTA ) 100-25 MCG/ACT 1 puff (has no administration in time range)  atorvastatin  (LIPITOR) tablet 40 mg (has no administration in time range)  guaiFENesin -dextromethorphan  (ROBITUSSIN DM) 100-10 MG/5ML syrup 5 mL (has no  administration in time range)  ipratropium-albuterol  (DUONEB) 0.5-2.5 (3) MG/3ML nebulizer solution 3 mL (3 mLs Nebulization Given 12/26/23 0241)  methylPREDNISolone  sodium succinate (SOLU-MEDROL ) 125 mg/2 mL injection 125 mg (125 mg Intravenous Given 12/26/23 0216)  potassium chloride  SA (KLOR-CON  M) CR tablet 40 mEq (40 mEq Oral Given 12/26/23 0548)                                    Medical Decision Making Cardiac monitor interpretation: Sinus rhythm, no ectopy  Patient here for shortness of breath in the setting of COPD.  Was recently discharged but has been hypoxic on room air in the bed in the upper 80s and was walking down to 84%.  She did not have any increased work of breathing with some mild wheezing.  I gave her  a breathing treatment as well as steroids.  She required 2 breathing treatments in the ER.  She is doing well on 2 L nasal cannula.  Discussed that this is because of the patient is admitted for further workup and management.  Patient is agreeable to plan.  Problems Addressed: Acute hypoxic respiratory failure (HCC): acute illness or injury that poses a threat to life or bodily functions COPD exacerbation (HCC): acute illness or injury  Amount and/or Complexity of Data Reviewed External Data Reviewed: notes.    Details: Prior hospital records reviewed and patient was recently discharged after being admitted for seizures Labs: ordered. Decision-making details documented in ED Course.    Details: Ordered and reviewed by me and unremarkable Radiology: ordered and independent interpretation performed. Decision-making details documented in ED Course.    Details: Ordered and interpreted by me independently of radiology Chest x-ray: Shows evidence of emphysema but no acute abnormality ECG/medicine tests: ordered and independent interpretation performed. Decision-making details documented in ED Course.    Details: Ordered and interpreted by me in the absence of cardiology and  shows sinus rhythm, no STEMI, or significant change when compared to prior EKG Discussion of management or test interpretation with external provider(s): Dr. Hall-hospitalist-700 mg the patient states that she will be admitted for further workup and management  Risk OTC drugs. Prescription drug management. Drug therapy requiring intensive monitoring for toxicity. Decision regarding hospitalization. Risk Details: CRITICAL CARE Performed by: Duwaine LITTIE Fusi   Total critical care time: 30 minutes  Critical care time was exclusive of separately billable procedures and treating other patients.  Critical care was necessary to treat or prevent imminent or life-threatening deterioration.  Critical care was time spent personally by me on the following activities: development of treatment plan with patient and/or surrogate as well as nursing, discussions with consultants, evaluation of patient's response to treatment, examination of patient, obtaining history from patient or surrogate, ordering and performing treatments and interventions, ordering and review of laboratory studies, ordering and review of radiographic studies, pulse oximetry and re-evaluation of patient's condition.   Critical Care Total time providing critical care: 30 minutes     Final diagnoses:  Acute hypoxic respiratory failure [facility])  COPD exacerbation [facility])    ED Discharge Orders     None          Fusi Duwaine LITTIE, DO 12/26/23 [id number]

## 2024-01-03 ENCOUNTER — Emergency Department (HOSPITAL_COMMUNITY): Payer: MEDICAID

## 2024-01-03 ENCOUNTER — Encounter (HOSPITAL_COMMUNITY): Payer: Self-pay | Admitting: Emergency Medicine

## 2024-01-03 ENCOUNTER — Other Ambulatory Visit: Payer: Self-pay

## 2024-01-03 ENCOUNTER — Emergency Department (HOSPITAL_COMMUNITY)
Admission: EM | Admit: 2024-01-03 | Discharge: 2024-01-03 | Disposition: A | Discharge status: Home / Self Care | Payer: MEDICAID | Attending: Emergency Medicine | Admitting: Emergency Medicine

## 2024-01-03 DIAGNOSIS — R569 Unspecified convulsions: Secondary | ICD-10-CM | POA: Insufficient documentation

## 2024-01-03 DIAGNOSIS — F17291 Nicotine dependence, other tobacco product, in remission: Secondary | ICD-10-CM | POA: Diagnosis not present

## 2024-01-03 DIAGNOSIS — J449 Chronic obstructive pulmonary disease, unspecified: Secondary | ICD-10-CM | POA: Insufficient documentation

## 2024-01-03 DIAGNOSIS — Z59819 Housing instability, housed unspecified: Secondary | ICD-10-CM

## 2024-01-03 DIAGNOSIS — R079 Chest pain, unspecified: Secondary | ICD-10-CM | POA: Insufficient documentation

## 2024-01-03 LAB — CBC
HCT: 43.7 % (ref 36.0–46.0)
Hemoglobin: 14.4 g/dL (ref 12.0–15.0)
MCH: 27.3 pg (ref 26.0–34.0)
MCHC: 33 g/dL (ref 30.0–36.0)
MCV: 82.9 fL (ref 80.0–100.0)
Platelets: 260 K/uL (ref 150–400)
RBC: 5.27 MIL/uL — ABNORMAL HIGH (ref 3.87–5.11)
RDW: 13.8 % (ref 11.5–15.5)
WBC: 16.3 K/uL — ABNORMAL HIGH (ref 4.0–10.5)
nRBC: 0 % (ref 0.0–0.2)

## 2024-01-03 LAB — BASIC METABOLIC PANEL WITH GFR
Anion gap: 8 (ref 5–15)
BUN: 7 mg/dL (ref 6–20)
CO2: 30 mmol/L (ref 22–32)
Calcium: 8.9 mg/dL (ref 8.9–10.3)
Chloride: 102 mmol/L (ref 98–111)
Creatinine, Ser: 0.62 mg/dL (ref 0.44–1.00)
GFR, Estimated: >60 mL/min
Glucose, Bld: 100 mg/dL — ABNORMAL HIGH (ref 70–99)
Potassium: 3.3 mmol/L — ABNORMAL LOW (ref 3.5–5.1)
Sodium: 140 mmol/L (ref 135–145)

## 2024-01-03 LAB — TROPONIN T, HIGH SENSITIVITY
Troponin T High Sensitivity: <15 ng/L (ref 0–19)
Troponin T High Sensitivity: <15 ng/L (ref 0–19)

## 2024-01-03 LAB — D-DIMER, QUANTITATIVE: D-Dimer, Quant: 0.58 ug{FEU}/mL — ABNORMAL HIGH (ref 0.00–0.50)

## 2024-01-03 LAB — CBG MONITORING, ED: Glucose-Capillary: 120 mg/dL — ABNORMAL HIGH (ref 70–99)

## 2024-01-03 MED ORDER — LEVETIRACETAM (KEPPRA) 500 MG/5 ML ADULT IV PUSH
2000.0000 mg | Freq: Once | INTRAVENOUS | Status: AC
  Administered 2024-01-03: 2000 mg via INTRAVENOUS
  Filled 2024-01-03: qty 20

## 2024-01-03 MED ORDER — ONDANSETRON HCL 4 MG/2ML IJ SOLN
4.0000 mg | Freq: Once | INTRAMUSCULAR | Status: AC
  Administered 2024-01-03: 4 mg via INTRAVENOUS
  Filled 2024-01-03: qty 2

## 2024-01-03 MED ORDER — MORPHINE SULFATE (PF) 4 MG/ML IV SOLN
4.0000 mg | Freq: Once | INTRAVENOUS | Status: DC
  Filled 2024-01-03: qty 1

## 2024-01-03 MED ORDER — SODIUM CHLORIDE 0.9 % IV BOLUS
1000.0000 mL | Freq: Once | INTRAVENOUS | Status: AC
  Administered 2024-01-03: 1000 mL via INTRAVENOUS

## 2024-01-03 NOTE — ED Notes (Signed)
"   Seizure pads placed on bilateral bedrails. Patient placed on 2L O2 via [place]. Patient lethargic, but moving extremities with patent airway at this time. "

## 2024-01-03 NOTE — ED Notes (Signed)
 Patient alert and oriented at this time. Complaining of ear pain
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
## 2024-01-03 NOTE — Discharge Instructions (Signed)
 It was our pleasure to provide your ER care today - we hope that you feel better.  Take your seizure meds as prescribed, make sure not to miss or skip any doses. No driving, swimming, or operating heavy machinery until cleared to do so by your doctor/neurologist. Follow up with neurologist in the next 1-2 weeks - call office to arrange appointment. Also follow up closely with your primary care doctor in the next 1-2 weeks.  See attached shelter and other social service resources in the community.   Return to ER if worse, new symptoms, fevers, new/severe pain, chest pain, trouble breathing, recurrent seizures, or other concern.

## 2024-01-03 NOTE — ED Triage Notes (Signed)
 Pt with c/o CP x 3 days and bilateral ear pain.

## 2024-01-03 NOTE — ED Notes (Signed)
 Pt offered gingerale and sandwich at this time.
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
## 2024-01-03 NOTE — ED Notes (Signed)
 Patient transported to X-ray

## 2024-01-03 NOTE — ED Provider Notes (Signed)
 Signed out by Dr Theadore that ED workup complete and medically stable for d/c. Indicates may benefit from shelter resources.   Recheck pt, resting comfortably, easily aroused, no new c/o. Pt is breathing comfortably, o2 sats 95%.   Pt currently appears stable for ED d/c. Will provide shelter and other social service resources.   Rec pcp f/u.  Return precautions provided.    Bernard Drivers, MD 01/03/24 1120

## 2024-01-03 NOTE — ED Notes (Addendum)
 Pt up for d/c, papers given. Per seizure protocol, pt cannot drive; pt sleeps in her car. Provider notified. Pt educated on the importance of not driving due to seizures.

## 2024-01-03 NOTE — ED Notes (Signed)
 Nurse entered patient's room due to low O2 saturation. Upon entering patient's room, patient SpO2 shows 66%, patient has snoring respirations, and having apparent seizure-like activity that lasted approx 30 seconds. Patient was rolled to her side, NRB placed on patient, and ED provider notified. ED provider responded to bedside and new orders placed.

## 2024-01-03 NOTE — ED Notes (Signed)
 Pt's O2 sats dropping to 88% on R/A during triage. Pt states she has COPD and is supposed to wear 2 L of oxygen  but reports that she doesn't because she just doesn't want to.

## 2024-01-06 ENCOUNTER — Other Ambulatory Visit: Payer: Self-pay

## 2024-01-06 ENCOUNTER — Emergency Department (HOSPITAL_COMMUNITY): Payer: MEDICAID

## 2024-01-06 ENCOUNTER — Emergency Department (HOSPITAL_COMMUNITY)
Admission: EM | Admit: 2024-01-06 | Discharge: 2024-01-06 | Discharge status: Against Medical Advice | Payer: MEDICAID | Attending: Emergency Medicine | Admitting: Emergency Medicine

## 2024-01-06 DIAGNOSIS — R079 Chest pain, unspecified: Secondary | ICD-10-CM | POA: Diagnosis present

## 2024-01-06 DIAGNOSIS — R0602 Shortness of breath: Secondary | ICD-10-CM | POA: Insufficient documentation

## 2024-01-06 DIAGNOSIS — Z5321 Procedure and treatment not carried out due to patient leaving prior to being seen by health care provider: Secondary | ICD-10-CM | POA: Insufficient documentation

## 2024-01-06 DIAGNOSIS — R059 Cough, unspecified: Secondary | ICD-10-CM | POA: Insufficient documentation

## 2024-01-06 LAB — BASIC METABOLIC PANEL WITH GFR
Anion gap: 11 (ref 5–15)
BUN: <5 mg/dL — ABNORMAL LOW (ref 6–20)
CO2: 26 mmol/L (ref 22–32)
Calcium: 8.7 mg/dL — ABNORMAL LOW (ref 8.9–10.3)
Chloride: 105 mmol/L (ref 98–111)
Creatinine, Ser: 0.66 mg/dL (ref 0.44–1.00)
GFR, Estimated: >60 mL/min
Glucose, Bld: 111 mg/dL — ABNORMAL HIGH (ref 70–99)
Potassium: 3 mmol/L — ABNORMAL LOW (ref 3.5–5.1)
Sodium: 141 mmol/L (ref 135–145)

## 2024-01-06 LAB — CBC
HCT: 39.6 % (ref 36.0–46.0)
Hemoglobin: 12.8 g/dL (ref 12.0–15.0)
MCH: 26.9 pg (ref 26.0–34.0)
MCHC: 32.3 g/dL (ref 30.0–36.0)
MCV: 83.4 fL (ref 80.0–100.0)
Platelets: 264 K/uL (ref 150–400)
RBC: 4.75 MIL/uL (ref 3.87–5.11)
RDW: 14.3 % (ref 11.5–15.5)
WBC: 10.8 K/uL — ABNORMAL HIGH (ref 4.0–10.5)
nRBC: 0 % (ref 0.0–0.2)

## 2024-01-06 LAB — TROPONIN T, HIGH SENSITIVITY: Troponin T High Sensitivity: 24 ng/L — ABNORMAL HIGH (ref 0–19)

## 2024-01-06 NOTE — Progress Notes (Signed)
 Assessment/Plan:   Assessment & Plan    Assessment & Plan Elevated troponin Tachycardia Seen at [facility] ER earlier in the day. Had HS troponin collected which resulted elevated at 24. Of note, she has had 12 ER or inpatient admissions since 12/03/2023 and this is the first time she has had a documented elevated troponin. Unfortunately she left without being seen.  She continues to endorse dyspnea, fatigue, and other concerning symptoms. I urged her to return to the ER but she adamantly declines as she has felt that her concerns have been dismissed at each presentation over the past several weeks.   HR initially 118, came down to 96 over the course of our visit. ECG stable compared to prior tracings.   I am worried about a potential PE given her acute on chronic dyspnea, tachycardia, reported hemoptysis, and mild elevation in troponin taken together with her history of DVT and not currently being anticoagulated. She does not have any clinical evidence of DVT on exam.   Will give 1 weeks worth of Eliquis samples at VTE dosing and work on getting a CTA of the chest done within the next day or 2.  She can then return for outpatient follow-up with Dr. Jacques.  I explained the plan to her and she voices understanding. I reiterated that with an elevated troponin of unknown provenance, she really should go to the ER.   .  Orders:   CTA Chest W Contrast; Future   ECG 12 lead   apixaban (ELIQUIS) 5 mg Tab; Take 2 tablets (10 mg total) by mouth two (2) times a day for 7 days.   hsTroponin I (single, no delta); Future  COPD with acute exacerbation    (CMS-HCC)  Poor control on current regimen. Has not bee using Spiriva. Does use an inhaler but it is not clear exaclty what she is using. Regardless, her control is suboptimal. She tells me she in unable to afford any new medications right now, not $4, I don't even have 4 cents. Thankfully SpO2 is within an acceptable range today. - Will give her a  Breztri  inhaler sample to hold her over until we can get social work on board to help with financial barriers and have her back to see PCP Dr. Jacques within a week  - Recently finished course of steroids - Is on doxycycline  and cefdinir  (she had the cefdinir  at home and started taking of her own volition) - Smoking cessation remains a paramount concern but she does not feel she is in a position to do this right now it is the only thing I have  Orders:   Basic metabolic panel; Future   budesonide -glycopyr-formoterol  (BREZTRI  AEROSPHERE) 160-9-4.8 mcg/actuation inhaler; Inhale 2 puffs two (2) times a day.  Patient nonadherent with medication regimen due to financial hardship Unhoused person This would seem to be the primary driver of her multiple ER and inpatient presentations recently. If we could find a way to get her some stability and access to her medications, we could likely avoid future hospitalizations.   Orders:   Ambulatory referral to Social Work; Future  Seizure-like activity    (CMS-HCC)  Longstanding history of the same. Currently taking Keppra  500mg  BID.  - Neurology called her to make an appointment at the time of our visit, she will follow-up with them       Return in 1 week (on 01/13/2024) for Recheck with [name].   Subjective:   Chief Complaint: Follow-up (Several visits to [facility]). Reports fatigue, balance issues, blurry vision, pain in eyes, and severe headache, reports all symptoms have worsened. )    History of Present Illness    HPI  Alexandra Sanchez is a 58 y.o. female is here for ER follow-up after being seen at the [facility] ED on 12/25 for acute on chronic fatigue and poorly controlled COPD.  She was subsequently seen at the [facility] ER on 12/26 after presenting with a seizure and then presented to the [facility] ER earlier today with dyspnea and fatigue.  She stayed just long enough to have a troponin collected and an ECG taken.  Her troponin  returned elevated at 24.  I am unable to see the ECG tracing.  She left without being seen. She comes in today continuing to tell me that her fatigue is getting worse that her dyspnea likewise is getting worse.  She also endorses a new symptom of hemoptysis.  She does have a history of DVT many years ago and was previously on Lovenox  injections but has not been on these for quite some time.  She does endorse bilateral leg pain but identifies that as being in her shins.  No swelling is present in her legs. She is an everyday smoker and continues to smoke.   Objective:   Physical Exam    Physical Exam Constitutional:      Comments: Chronic ill appearing but non-toxic and dose not appear in extremis  HENT:     Mouth/Throat:     Mouth: Mucous membranes are dry.  Cardiovascular:     Rate and Rhythm: Normal rate and regular rhythm.     Comments: 2/6 systolic murmur heard best at RUSB Pulmonary:     Comments: Increased WOB on RA, speaking in [place] phrases, coarse and wheezy throughout  Musculoskeletal:        General: No swelling or deformity.     Comments: The bilateral legs are symmetric, without redness, warmth, edema.  There is tenderness pretibially bilaterally.  Skin:    General: Skin is warm and dry.  Psychiatric:        Mood and Affect: Mood is depressed.        Thought Content: Thought content does not include suicidal ideation. Thought content does not include suicidal plan.     Visit Vitals BP 111/76 (BP Site: L Arm, BP Position: Sitting, BP Cuff Size: Medium)  Pulse 118  Temp 36.8 C (98.2 F) (Temporal)  Resp 18  Ht 160 cm (5' 3)  Wt 52.9 kg (116 lb 11.2 oz)  SpO2 93%  BMI 20.67 kg/m   BP Readings from Last 3 Encounters:  01/06/24 111/76  01/02/24 141/92  12/31/23 112/76   Wt Readings from Last 3 Encounters:  01/06/24 52.9 kg (116 lb 11.2 oz)  01/02/24 52.2 kg (115 lb)  12/31/23 52.2 kg (115 lb)              I personally spent 70 minutes face-to-face  and non-face-to-face in the care of this patient, which includes all pre, intra, and post visit time on the date of service.

## 2024-01-06 NOTE — ED Notes (Signed)
 Multiple attempts to call/find patient for 2nd trop, unable to locate patient

## 2024-01-06 NOTE — Telephone Encounter (Signed)
 Patient's physician is Dr. Jacques.  Dr. Jacques is not here this week so patient did see Dr. Marlee who recommended she follow up with Dr. Jacques week of 01/13/2024.  Patient refused.  Patient does not want to see Dr. Jacques.

## 2024-01-06 NOTE — ED Triage Notes (Signed)
 Patient reports central chest pain for several weeks with SOB and occasional dry cough , no emesis or diaphoresis .

## 2024-01-07 NOTE — Telephone Encounter (Signed)
 Copied from CRM #[id number]. Topic: Access To Clinicians - Req Clinic Call Back >> Jan 07, 2024  8:25 AM Lonell GRADE wrote: Pt would like Dr. Marlee to give her a call due to the medication Eliquis she needs clarity. Please assist [phone]

## 2024-01-07 NOTE — Telephone Encounter (Addendum)
 Call back returned to patient who reports she was seen in the office yesterday and she is requesting to know if she should take the medication eliquis or not statesI don't even know if I have a blood clot or not pt asked if she discussed her concerns at her visit with the provider yesterday pt statesno I was to tired pt reports she went to the Emergency room yesterday but let without being seen  advised pt that she needed to return to the emergency room for a complete evaluation patient states I don't want to go back there I waited five or six hours last night and that's just too long  Discussed with Dr Marlee pts concerns , provider has advised that patient should take the medication eliquis as  directed - apixaban 2 tablets by mouth 2 times a day for 7 days.   [id number] - RN called back and spoke with patient discussed Dr Karlton  recommendations with the patient, patient verbalizes understanding , pt is requesting to see Dr Marlee for follow up visit   Will  forward to Dr Marlee for review of this reqeust

## 2024-01-08 NOTE — Telephone Encounter (Signed)
 Copied from CRM [id number]. Topic: Referral - Referral Status >> Jan 08, 2024  9:54 AM Ileana B wrote: Status Needed: They are inquiring about the status of the referral/order request for Referral: Child Psychotherapist. Facility Request: N/A. Coverage: yes, coverage is accurate on file.   The patient preferred contact: Cell Phone Telephone Information: Mobile          [phone]  Urgent callback turnaround time: within 24 business hours. (Caller Notified) Pt is scheduled for appt today at 1 with provider- stated to pt to mention referral status to provider at time of appt today

## 2024-01-10 NOTE — Telephone Encounter (Signed)
 Patient states that she woke up this morning with a painful mass on the left upper part of her thigh. I offered patient an appointment but declined and states that she is not going to the hospital. Please advise

## 2024-01-10 NOTE — Telephone Encounter (Signed)
 Copied from CRM #[id number]. Topic: Access To Clinicians - Req Clinic Call Back >> Jan 10, 2024  9:21 AM Rolin R wrote: Reason of the Call: Lump on outer left thigh  Requesting: a call back from nurse   Supporting details:Woke up with lump on thigh, I offered appt but pt declined for now. Would like to speak to nurse asap regarding concern.  Appointment: They declined to schedule an appointment.   Does the caller want to be contacted regarding this request? Yes. Please contact The patient by Cell Phone Telephone Information: Mobile          [phone]   Urgent callback turnaround time: within 24 business hours. Programmer, Systems Notified)

## 2024-01-10 NOTE — Telephone Encounter (Signed)
 RN called and spoke with the patient who is requesting her lab results , advised that provider would be alerted to review labs and she will be contacted by the clinical team to discuss lab results   Pt also c/o - -mass like area to left upper posterior thigh  -noticed approx 0800 this am  -denies any drainage  -redness present  -area is warm and hard to the touch  -statesits about as long as a dollar bill   Pt advised that no appt are available today in clinic pt denies ED recommendations encouraged pt to go to her local UC for further evaluation and treatment of the area. Pt verbalizes understanding will call the office if she decides she wants to be scheduled as pt declines to schedule at this time

## 2024-01-10 NOTE — Telephone Encounter (Signed)
 Pt  transferred by [facility] team  to the office when call picked up patient statesIm not sure why they transferred me to you  patient  reports she went to the UC as advised and was advised to go to the Emergency room , patient reports she declined this recommendation and signed out AMA, pt encouraged that if she was advised to go to the Emergency room for further evaluation  then encouraged pt to go as provider is not in clinic at time of call , patient states Im not going to the Emergency room here they suck pt then hung up the phone   Will route  to provider for awareness

## 2024-01-12 NOTE — ED Triage Notes (Signed)
 Localized swelling and pain to left upper leg x 2 days.

## 2024-01-14 NOTE — Telephone Encounter (Signed)
 Copied from CRM #[id number]. Topic: Access To Clinicians - Clinical Question/Test Result >> Jan 14, 2024 10:49 AM Arland HERO wrote: Test Results: The patient has requested to be contacted regarding their the results for CT scan  . Preferred Contact: Cell Phone Telephone Information: Mobile          [phone]  Routine callback turnaround time: 24-48 business hours. Programmer, Systems Notified)

## 2024-01-17 ENCOUNTER — Encounter: Payer: Self-pay | Admitting: Diagnostic Neuroimaging

## 2024-01-17 ENCOUNTER — Ambulatory Visit: Payer: MEDICAID | Admitting: Diagnostic Neuroimaging

## 2024-01-17 VITALS — BP 107/69 | HR 69 | Resp 16 | Ht 63.0 in | Wt 118.8 lb

## 2024-01-17 DIAGNOSIS — G40909 Epilepsy, unspecified, not intractable, without status epilepticus: Secondary | ICD-10-CM | POA: Diagnosis not present

## 2024-01-17 DIAGNOSIS — F418 Other specified anxiety disorders: Secondary | ICD-10-CM

## 2024-01-17 MED ORDER — LEVETIRACETAM 1000 MG PO TABS: 1000.0000 mg | ORAL_TABLET | Freq: Two times a day (BID) | ORAL | 4 refills | Status: AC

## 2024-01-17 NOTE — Patient Instructions (Signed)
 SEIZURE DISORDER  - increase levetiracetam  to 1000mg  twice a day   - According to [place] law, you can not drive unless you are seizure / syncope free for at least 6 months and under physician's care.   - Please maintain precautions. Do not participate in activities where a loss of awareness could harm you or someone else. No swimming alone, no tub bathing, no hot tubs, no driving, no operating motorized vehicles (cars, ATVs, motocycles, etc), lawnmowers, power tools or firearms. No standing at heights, such as rooftops, ladders or stairs. Avoid hot objects such as stoves, heaters, open fires. Wear a helmet when riding a bicycle, scooter, skateboard, etc. and avoid areas of traffic. Set your water heater to 120 degrees or less.
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
## 2024-01-20 ENCOUNTER — Telehealth: Payer: Self-pay | Admitting: Diagnostic Neuroimaging

## 2024-01-20 NOTE — Telephone Encounter (Signed)
 Referral for psychology fax to [facility]. Phone: [phone], Fax: [phone]

## 2024-01-21 ENCOUNTER — Telehealth: Payer: Self-pay

## 2024-01-21 DIAGNOSIS — F418 Other specified anxiety disorders: Secondary | ICD-10-CM
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
## 2024-01-28 NOTE — Telephone Encounter (Signed)
 Not sure what I am supposed to do with this note
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
## 2024-05-01 ENCOUNTER — Institutional Professional Consult (permissible substitution): Payer: MEDICAID | Admitting: Diagnostic Neuroimaging

## 2024-07-20 ENCOUNTER — Ambulatory Visit: Payer: MEDICAID | Admitting: Adult Health
